# Patient Record
Sex: Female | Born: 1955 | Race: White | Hispanic: No | Marital: Married | Smoking: Never smoker
Health system: Southern US, Community
[De-identification: ages and names within clinical notes are randomized; demographics above are authoritative.]

## PROBLEM LIST (undated history)

## (undated) DIAGNOSIS — L509 Urticaria, unspecified: Secondary | ICD-10-CM

## (undated) DIAGNOSIS — L719 Rosacea, unspecified: Secondary | ICD-10-CM

## (undated) DIAGNOSIS — R51 Headache: Secondary | ICD-10-CM

## (undated) DIAGNOSIS — G4733 Obstructive sleep apnea (adult) (pediatric): Secondary | ICD-10-CM

## (undated) DIAGNOSIS — M1990 Unspecified osteoarthritis, unspecified site: Secondary | ICD-10-CM

## (undated) DIAGNOSIS — B029 Zoster without complications: Secondary | ICD-10-CM

## (undated) DIAGNOSIS — F32A Depression, unspecified: Secondary | ICD-10-CM

## (undated) DIAGNOSIS — G4730 Sleep apnea, unspecified: Secondary | ICD-10-CM

## (undated) DIAGNOSIS — T7840XA Allergy, unspecified, initial encounter: Secondary | ICD-10-CM

## (undated) DIAGNOSIS — D229 Melanocytic nevi, unspecified: Secondary | ICD-10-CM

## (undated) DIAGNOSIS — R768 Other specified abnormal immunological findings in serum: Secondary | ICD-10-CM

## (undated) DIAGNOSIS — Z780 Asymptomatic menopausal state: Secondary | ICD-10-CM

## (undated) DIAGNOSIS — R0602 Shortness of breath: Secondary | ICD-10-CM

## (undated) DIAGNOSIS — S62101A Fracture of unspecified carpal bone, right wrist, initial encounter for closed fracture: Secondary | ICD-10-CM

## (undated) DIAGNOSIS — F329 Major depressive disorder, single episode, unspecified: Secondary | ICD-10-CM

## (undated) DIAGNOSIS — R9439 Abnormal result of other cardiovascular function study: Secondary | ICD-10-CM

## (undated) DIAGNOSIS — E785 Hyperlipidemia, unspecified: Secondary | ICD-10-CM

## (undated) HISTORY — DX: Hyperlipidemia, unspecified: E78.5

## (undated) HISTORY — DX: Zoster without complications: B02.9

## (undated) HISTORY — DX: Other specified abnormal immunological findings in serum: R76.8

## (undated) HISTORY — PX: OTHER SURGICAL HISTORY: SHX169

## (undated) HISTORY — DX: Headache: R51

## (undated) HISTORY — DX: Asymptomatic menopausal state: Z78.0

## (undated) HISTORY — DX: Melanocytic nevi, unspecified: D22.9

## (undated) HISTORY — DX: Urticaria, unspecified: L50.9

## (undated) HISTORY — DX: Abnormal result of other cardiovascular function study: R94.39

## (undated) HISTORY — PX: WISDOM TOOTH EXTRACTION: SHX21

## (undated) HISTORY — DX: Rosacea, unspecified: L71.9

## (undated) HISTORY — DX: Fracture of unspecified carpal bone, right wrist, initial encounter for closed fracture: S62.101A

## (undated) HISTORY — DX: Allergy, unspecified, initial encounter: T78.40XA

## (undated) HISTORY — DX: Unspecified osteoarthritis, unspecified site: M19.90

## (undated) HISTORY — DX: Obstructive sleep apnea (adult) (pediatric): G47.33

## (undated) HISTORY — DX: Shortness of breath: R06.02

## (undated) HISTORY — PX: ORIF WRIST FRACTURE: SHX2133

## (undated) HISTORY — DX: Major depressive disorder, single episode, unspecified: F32.9

## (undated) HISTORY — DX: Depression, unspecified: F32.A

---

## 2009-01-01 DIAGNOSIS — L509 Urticaria, unspecified: Secondary | ICD-10-CM

## 2009-01-01 HISTORY — DX: Urticaria, unspecified: L50.9

## 2011-10-29 ENCOUNTER — Ambulatory Visit: Payer: Self-pay | Admitting: Internal Medicine

## 2011-11-13 ENCOUNTER — Ambulatory Visit (INDEPENDENT_AMBULATORY_CARE_PROVIDER_SITE_OTHER): Payer: 59 | Admitting: Internal Medicine

## 2011-11-13 ENCOUNTER — Encounter: Payer: Self-pay | Admitting: Internal Medicine

## 2011-11-13 VITALS — BP 110/78 | HR 113 | Temp 97.5°F | Resp 18 | Wt 145.0 lb

## 2011-11-13 DIAGNOSIS — Z23 Encounter for immunization: Secondary | ICD-10-CM

## 2011-11-13 DIAGNOSIS — Z139 Encounter for screening, unspecified: Secondary | ICD-10-CM

## 2011-11-13 DIAGNOSIS — L509 Urticaria, unspecified: Secondary | ICD-10-CM

## 2011-11-13 DIAGNOSIS — R51 Headache: Secondary | ICD-10-CM

## 2011-11-13 DIAGNOSIS — R0609 Other forms of dyspnea: Secondary | ICD-10-CM

## 2011-11-13 DIAGNOSIS — Z780 Asymptomatic menopausal state: Secondary | ICD-10-CM

## 2011-11-13 DIAGNOSIS — R0989 Other specified symptoms and signs involving the circulatory and respiratory systems: Secondary | ICD-10-CM

## 2011-11-13 DIAGNOSIS — E328 Other diseases of thymus: Secondary | ICD-10-CM

## 2011-11-13 DIAGNOSIS — D229 Melanocytic nevi, unspecified: Secondary | ICD-10-CM

## 2011-11-13 DIAGNOSIS — F3289 Other specified depressive episodes: Secondary | ICD-10-CM

## 2011-11-13 DIAGNOSIS — D239 Other benign neoplasm of skin, unspecified: Secondary | ICD-10-CM

## 2011-11-13 DIAGNOSIS — F329 Major depressive disorder, single episode, unspecified: Secondary | ICD-10-CM

## 2011-11-13 DIAGNOSIS — F32A Depression, unspecified: Secondary | ICD-10-CM

## 2011-11-13 DIAGNOSIS — R0683 Snoring: Secondary | ICD-10-CM

## 2011-11-13 DIAGNOSIS — L719 Rosacea, unspecified: Secondary | ICD-10-CM | POA: Insufficient documentation

## 2011-11-13 NOTE — Patient Instructions (Addendum)
Flu shot today  Labs on [employer]

## 2011-11-14 ENCOUNTER — Encounter: Payer: Self-pay | Admitting: Internal Medicine

## 2011-11-14 DIAGNOSIS — R519 Headache, unspecified: Secondary | ICD-10-CM | POA: Insufficient documentation

## 2011-11-14 DIAGNOSIS — R51 Headache: Secondary | ICD-10-CM | POA: Insufficient documentation

## 2011-11-14 DIAGNOSIS — D229 Melanocytic nevi, unspecified: Secondary | ICD-10-CM | POA: Insufficient documentation

## 2011-11-14 NOTE — Progress Notes (Signed)
  Subjective:    Patient ID: Brianna Ross, female    DOB: 07/11/1955, 56 y.o.   MRN: [id number]  HPI [name] is here for first visit to establish primary care.  PMH depression well controlled on Pristiq, chronic headaches which pt believes to be migraines, genital herpes (Type ), childhood asthma, atypcial nevii rosacea and urticaria of unknown etiology.    Pt is concerned about excessive snoring and daytime sleepiness.  She states her husband mentions that she may stop breathing at times at night.  She has never had a colonoscopy and would like a referral  No Known Allergies Past Medical History  Diagnosis Date  . Allergy    Past Surgical History  Procedure Date  . Thymic cyst     removal   History   Social History  . Marital Status: Married    Spouse Name: N/A    Number of Children: N/A  . Years of Education: N/A   Occupational History  . Not on file.   Social History Main Topics  . Smoking status: Never Smoker   . Smokeless tobacco: Not on file  . Alcohol Use: No  . Drug Use: No  . Sexually Active: Yes    Birth Control/ Protection: Post-menopausal   Other Topics Concern  . Not on file   Social History Narrative  . No narrative on file   Family History  Problem Relation Age of Onset  . Glaucoma Mother   . Hyperlipidemia Mother   . Depression Mother   . Osteoporosis Mother   . Heart disease Father   . Hyperlipidemia Father   . Hypertension Father   . Heart defect Sister   . Hyperlipidemia Brother   . Cancer Paternal Grandmother   . Stroke Paternal Grandfather   . Heart disease Paternal Grandfather    Patient Active Problem List  Diagnosis  . Depression  . Menopause  . Rosacea  . Urticaria  . Thymic cyst  . Headache   Current Outpatient Prescriptions on File Prior to Visit  Medication Sig Dispense Refill  . desvenlafaxine (PRISTIQ) 50 MG 24 hr tablet Take 50 mg by mouth daily.           Review of Systems See HPI    Objective:   Physical  Exam Physical Exam  Nursing note and vitals reviewed.  Constitutional: She is oriented to person, place, and time. She appears well-developed and well-nourished.  HENT:  Head: Normocephalic and atraumatic.  Cardiovascular: Normal rate and regular rhythm. Exam reveals no gallop and no friction rub.  No murmur heard.  Pulmonary/Chest: Breath sounds normal. She has no wheezes. She has no rales.  Neurological: She is alert and oriented to person, place, and time.  Skin: Skin is warm and dry.  Psychiatric: She has a normal mood and affect. Her behavior is normal.  Ext no edema       Assessment & Plan:  Snoring, daytime sleepiness:   Will refer to Dr. Vickey Huger for sleep eval  Depression  Managed by Dr. Nolen Mu  Well controlled on Pristiq  Menopause  Minimal symptomatology  History of Genital herpes type I  HIstory of atypical nevi  Managed by Dr. Emily Filbert  Rosacea  History of childhood asthma  Influenza vaccine today  Refer to GI for colonoscopy

## 2012-02-17 ENCOUNTER — Encounter: Payer: Self-pay | Admitting: Internal Medicine

## 2012-02-17 DIAGNOSIS — G4733 Obstructive sleep apnea (adult) (pediatric): Secondary | ICD-10-CM | POA: Insufficient documentation

## 2012-02-21 ENCOUNTER — Encounter: Payer: Self-pay | Admitting: Internal Medicine

## 2012-02-21 ENCOUNTER — Other Ambulatory Visit: Payer: Self-pay | Admitting: Internal Medicine

## 2012-02-21 ENCOUNTER — Ambulatory Visit (INDEPENDENT_AMBULATORY_CARE_PROVIDER_SITE_OTHER): Payer: 59 | Admitting: Internal Medicine

## 2012-02-21 VITALS — BP 122/73

## 2012-02-21 DIAGNOSIS — IMO0001 Reserved for inherently not codable concepts without codable children: Secondary | ICD-10-CM | POA: Insufficient documentation

## 2012-02-21 DIAGNOSIS — M791 Myalgia, unspecified site: Secondary | ICD-10-CM

## 2012-02-21 MED ORDER — NABUMETONE 500 MG PO TABS: 500.0000 mg | ORAL_TABLET | Freq: Two times a day (BID) | ORAL | Status: DC

## 2012-02-21 NOTE — Progress Notes (Signed)
Subjective:    Patient ID: Brianna Ross, female    DOB: 07-21-1955, 57 y.o.   MRN: [id number]  HPI Brianna Ross is here for acute visit.    She has L are muscle pain for the past 6-9 months.  She points to muscle belly of her L arm.  L shoulder and L elbow joint is fine no pain.  She has take a rare Tylenol for this  No rash no redness to arm.  She believes things may have started when she lifted some heavy charts at Dr. Otilio Ross office  No new meds  No Known Allergies Past Medical History  Diagnosis Date  . Allergy    Past Surgical History  Procedure Laterality Date  . Thymic cyst      removal   History   Social History  . Marital Status: Married    Spouse Name: N/A    Number of Children: N/A  . Years of Education: N/A   Occupational History  . Not on file.   Social History Main Topics  . Smoking status: Never Smoker   . Smokeless tobacco: Not on file  . Alcohol Use: No  . Drug Use: No  . Sexually Active: Yes    Birth Control/ Protection: Post-menopausal   Other Topics Concern  . Not on file   Social History Narrative  . No narrative on file   Family History  Problem Relation Age of Onset  . Glaucoma Mother   . Hyperlipidemia Mother   . Depression Mother   . Osteoporosis Mother   . Heart disease Father   . Hyperlipidemia Father   . Hypertension Father   . Heart defect Sister   . Hyperlipidemia Brother   . Cancer Paternal Grandmother   . Stroke Paternal Grandfather   . Heart disease Paternal Grandfather    Patient Active Problem List  Diagnosis  . Depression  . Menopause  . Rosacea  . Urticaria  . Thymic cyst  . Headache  . Atypical nevi  . Obstructive sleep apnea   Current Outpatient Prescriptions on File Prior to Visit  Medication Sig Dispense Refill  . acetaminophen (TYLENOL) 500 MG tablet Take 500 mg by mouth every 6 (six) hours as needed.      . Alcaftadine (LASTACAFT) 0.25 % SOLN Apply to eye.      . betamethasone valerate (VALISONE) 0.1 % cream  Apply topically as needed.      . desvenlafaxine (PRISTIQ) 50 MG 24 hr tablet Take 50 mg by mouth daily.      . metroNIDAZOLE (METROGEL) 1 % gel Apply topically daily.       No current facility-administered medications on file prior to visit.       Review of Systems    see HPI Objective:   Physical Exam Physical Exam  Nursing note and vitals reviewed.  Constitutional: She is oriented to person, place, and time. She appears well-developed and well-nourished.  HENT:  Head: Normocephalic and atraumatic.  Cardiovascular: Normal rate and regular rhythm. Exam reveals no gallop and no friction rub.  No murmur heard.  Pulmonary/Chest: Breath sounds normal. She has no wheezes. She has no rales.  Neurological: She is alert and oriented to person, place, and time. M/S  No active synovitis to L shoudler or L elbow.  She has subjective pain to palpation of biceps muscle  Skin: Skin is warm and dry.  Psychiatric: She has a normal mood and affect. Her behavior is normal.  Assessment & Plan:  Myalgia vs biceps tendinitis  Will give Relafen 500 mg bid for the next 3 weeks.  No lifting with L arm  Will check ANA , CPK and aldolase.    See me if not better

## 2012-02-21 NOTE — Patient Instructions (Addendum)
See me in 3-4 weeks

## 2012-02-22 DIAGNOSIS — M791 Myalgia, unspecified site: Secondary | ICD-10-CM | POA: Insufficient documentation

## 2012-02-28 ENCOUNTER — Telehealth: Payer: Self-pay | Admitting: *Deleted

## 2012-02-28 LAB — CK: Total CK: 54 U/L (ref 7–177)

## 2012-02-28 NOTE — Telephone Encounter (Signed)
[facility] contacted regarding missing lab results states that blood was not collected but will add Aldolase and Ck to existing blood.

## 2012-03-01 DIAGNOSIS — B029 Zoster without complications: Secondary | ICD-10-CM

## 2012-03-01 HISTORY — DX: Zoster without complications: B02.9

## 2012-03-04 ENCOUNTER — Telehealth: Payer: Self-pay | Admitting: Internal Medicine

## 2012-03-04 NOTE — Telephone Encounter (Signed)
Left message to call office on WEdnesday for results

## 2012-03-04 NOTE — Telephone Encounter (Signed)
Left message on home phone to call and discuss labs

## 2012-03-04 NOTE — Telephone Encounter (Signed)
Left message on home phone for pt to call regarding lab results

## 2012-03-05 ENCOUNTER — Telehealth: Payer: Self-pay | Admitting: *Deleted

## 2012-03-05 ENCOUNTER — Encounter: Payer: Self-pay | Admitting: *Deleted

## 2012-03-05 NOTE — Telephone Encounter (Signed)
Left message on home phone [phone]  To retrun call to office to discuss labs

## 2012-03-05 NOTE — Telephone Encounter (Signed)
Pt returned Dr Lonell Face phone call regarding lab results can be reached at [phone] for the next hour if not will  Call back tomorrow

## 2012-03-06 ENCOUNTER — Encounter: Payer: Self-pay | Admitting: *Deleted

## 2012-03-06 ENCOUNTER — Telehealth: Payer: Self-pay | Admitting: Internal Medicine

## 2012-03-06 DIAGNOSIS — R768 Other specified abnormal immunological findings in serum: Secondary | ICD-10-CM

## 2012-03-06 DIAGNOSIS — R748 Abnormal levels of other serum enzymes: Secondary | ICD-10-CM | POA: Insufficient documentation

## 2012-03-06 NOTE — Telephone Encounter (Signed)
Spoke with pt and informed of lab results.    With elevated aldolase and pos ANA  Will refer get rheumatology opinion

## 2012-03-23 ENCOUNTER — Encounter (HOSPITAL_BASED_OUTPATIENT_CLINIC_OR_DEPARTMENT_OTHER): Payer: Self-pay | Admitting: *Deleted

## 2012-03-23 ENCOUNTER — Emergency Department (HOSPITAL_BASED_OUTPATIENT_CLINIC_OR_DEPARTMENT_OTHER)
Admission: EM | Admit: 2012-03-23 | Discharge: 2012-03-24 | Disposition: A | Discharge status: Home / Self Care | Payer: 59 | Attending: Emergency Medicine | Admitting: Emergency Medicine

## 2012-03-23 DIAGNOSIS — Z8669 Personal history of other diseases of the nervous system and sense organs: Secondary | ICD-10-CM | POA: Insufficient documentation

## 2012-03-23 DIAGNOSIS — Z79899 Other long term (current) drug therapy: Secondary | ICD-10-CM | POA: Insufficient documentation

## 2012-03-23 DIAGNOSIS — B0222 Postherpetic trigeminal neuralgia: Secondary | ICD-10-CM

## 2012-03-23 HISTORY — DX: Sleep apnea, unspecified: G47.30

## 2012-03-23 NOTE — ED Provider Notes (Signed)
History     CSN: [id number]  Arrival date & time 03/23/12  2238   First MD Initiated Contact with Patient 03/23/12 2349      Chief Complaint  Patient presents with  . Rash    (Consider location/radiation/quality/duration/timing/severity/associated sxs/prior treatment) HPI This a 57 year old female who had the gradual onset of a painful rash of her left forehead. It began insidiously and has worsened. It is moderately painful. It is primarily located in the glabella and less so in the upper and lateral aspects of the left forehead. There is no involvement of the. She denies any systemic symptoms, notably no fever, chills, cough, shortness of breath, chest pain, abdominal pain, nausea, vomiting or diarrhea.  Past Medical History  Diagnosis Date  . Allergy   . Sleep apnea     Past Surgical History  Procedure Laterality Date  . Thymic cyst      removal    Family History  Problem Relation Age of Onset  . Glaucoma Mother   . Hyperlipidemia Mother   . Depression Mother   . Osteoporosis Mother   . Heart disease Father   . Hyperlipidemia Father   . Hypertension Father   . Heart defect Sister   . Hyperlipidemia Brother   . Cancer Paternal Grandmother   . Stroke Paternal Grandfather   . Heart disease Paternal Grandfather     History  Substance Use Topics  . Smoking status: Never Smoker   . Smokeless tobacco: Not on file  . Alcohol Use: No    OB History   Grav Para Term Preterm Abortions TAB SAB Ect Mult Living                  Review of Systems  All other systems reviewed and are negative.    Allergies  Review of patient's allergies indicates no known allergies.  Home Medications   Current Outpatient Rx  Name  Route  Sig  Dispense  Refill  . diltiazem 2 % GEL   Topical   Apply topically 2 (two) times daily.         Marland Kitchen acetaminophen (TYLENOL) 500 MG tablet   Oral   Take 500 mg by mouth every 6 (six) hours as needed.         . Alcaftadine  (LASTACAFT) 0.25 % SOLN   Ophthalmic   Apply to eye.         . betamethasone valerate (VALISONE) 0.1 % cream   Topical   Apply topically as needed.         . desvenlafaxine (PRISTIQ) 50 MG 24 hr tablet   Oral   Take 50 mg by mouth daily.         . metroNIDAZOLE (METROGEL) 1 % gel   Topical   Apply topically daily.         . nabumetone (RELAFEN) 500 MG tablet   Oral   Take 1 tablet (500 mg total) by mouth 2 (two) times daily.   30 tablet   0     BP 129/87  Pulse 90  Temp(Src) 98.2 F (36.8 C) (Oral)  Resp 20  Ht 5' 4.5" (1.638 m)  Wt 145 lb (65.772 kg)  BMI 24.51 kg/m2  SpO2 99%  Physical Exam General: Well-developed, well-nourished female in no acute distress; appearance consistent with age of record HENT: normocephalic, atraumatic Eyes: pupils equal round and reactive to light; extraocular muscles intact; no erythema or exudate Neck: supple Heart: regular rate and rhythm Lungs: Normal  respiratory effort and excursion Abdomen: soft; nondistended Extremities: No deformity; full range of motion Neurologic: Awake, alert and oriented; motor function intact in all extremities and symmetric; no facial droop Skin: Warm and dry; erythematous papular rash of left forehead, primarily in the glabella, early vesicular formation noted Psychiatric: Normal mood and affect    ED Course  Procedures (including critical care time)     MDM  Will treat presumptively for early herpes zoster of the left first branch of the trigeminal nerve. She was advised to see her ophthalmologist should any ocular involvement occur.        Carlisle Beers Ebunoluwa Gernert, MD 03/24/12 0000

## 2012-03-23 NOTE — ED Notes (Signed)
Pt states she had 3 small red bumps to her forehead this a.m. that have come together to now form a raised red area to her forehead.

## 2012-03-24 MED ORDER — HYDROCODONE-ACETAMINOPHEN 5-325 MG PO TABS: 1.0000 | ORAL_TABLET | Freq: Four times a day (QID) | ORAL | Status: DC | PRN

## 2012-03-24 MED ORDER — VALACYCLOVIR HCL 1 G PO TABS: 1000.0000 mg | ORAL_TABLET | Freq: Three times a day (TID) | ORAL | Status: AC

## 2012-03-24 NOTE — Discharge Instructions (Signed)
Shingles Shingles is caused by the same virus that causes chickenpox (varicella zoster virus or VZV). Shingles often occurs many years or decades after having chickenpox. That is why it is more common in adults older than 50 years. The virus reactivates and breaks out as an infection in a nerve root. SYMPTOMS   The initial feeling (sensations) may be pain. This pain is usually described as:  Burning.  Stabbing.  Throbbing.  Tingling in the nerve root.  A red rash will follow in a couple days. The rash may occur in any area of the body and is usually on one side (unilateral) of the body in a band or belt-like pattern. The rash usually starts out as very small blisters (vesicles). They will dry up after 7 to 10 days. This is not usually a significant problem except for the pain it causes.  Long-lasting (chronic) pain is more likely in an elderly person. It can last months to years. This condition is called postherpetic neuralgia. Shingles can be an extremely severe infection in someone with AIDS, a weakened immune system, or with forms of leukemia. It can also be severe if you are taking transplant medicines or other medicines that weaken the immune system. TREATMENT  Your caregiver will often treat you with:  Antiviral drugs.  Anti-inflammatory drugs.  Pain medicines. Bed rest is very important in preventing the pain associated with herpes zoster (postherpetic neuralgia). Application of heat in the form of a hot water bottle or electric heating pad or gentle pressure with the hand is recommended to help with the pain or discomfort. PREVENTION  A varicella zoster vaccine is available to help protect against the virus. The Food and Drug Administration approved the varicella zoster vaccine for individuals 61 years of age and older. HOME CARE INSTRUCTIONS   Cool compresses to the area of rash may be helpful.  Only take over-the-counter or prescription medicines for pain, discomfort, or  fever as directed by your caregiver.  Avoid contact with:  Babies.  Pregnant women.  Children with eczema.  Elderly people with transplants.  People with chronic illnesses, such as leukemia and AIDS.  If the area involved is on your face, you may receive a referral for follow-up to a specialist. It is very important to keep all follow-up appointments. This will help avoid eye complications, chronic pain, or disability. SEEK IMMEDIATE MEDICAL CARE IF:   You develop any pain (headache) in the area of the eye. This must be followed carefully by your caregiver or ophthalmologist. An infection in part of your eye (cornea) can be very serious. It could lead to blindness.  You do not have pain relief from prescribed medicines.  Your redness or swelling spreads.  The area involved becomes very swollen and painful.  You have a fever.  You notice any red or painful lines extending away from the affected area toward your heart (lymphangitis).  Your condition is worsening or has changed. Document Released: 12/18/2004 Document Revised: 03/12/2011 Document Reviewed: 11/22/2008 [facility] Patient Information 2013 [place], [place].

## 2012-04-03 ENCOUNTER — Encounter: Payer: Self-pay | Admitting: Neurology

## 2012-04-08 NOTE — Progress Notes (Signed)
Patient has to be sched for a consult?

## 2012-04-21 ENCOUNTER — Encounter: Payer: Self-pay | Admitting: Internal Medicine

## 2012-04-21 DIAGNOSIS — B0239 Other herpes zoster eye disease: Secondary | ICD-10-CM | POA: Insufficient documentation

## 2012-05-01 ENCOUNTER — Encounter: Payer: Self-pay | Admitting: Neurology

## 2012-05-16 ENCOUNTER — Ambulatory Visit (INDEPENDENT_AMBULATORY_CARE_PROVIDER_SITE_OTHER): Payer: 59 | Admitting: Neurology

## 2012-05-16 ENCOUNTER — Encounter: Payer: Self-pay | Admitting: Neurology

## 2012-05-16 VITALS — BP 108/76 | HR 89 | Temp 98.0°F | Ht 64.75 in | Wt 144.0 lb

## 2012-05-16 DIAGNOSIS — F32A Depression, unspecified: Secondary | ICD-10-CM

## 2012-05-16 DIAGNOSIS — G4733 Obstructive sleep apnea (adult) (pediatric): Secondary | ICD-10-CM

## 2012-05-16 DIAGNOSIS — F3289 Other specified depressive episodes: Secondary | ICD-10-CM

## 2012-05-16 DIAGNOSIS — F329 Major depressive disorder, single episode, unspecified: Secondary | ICD-10-CM

## 2012-05-16 MED ORDER — ESZOPICLONE 2 MG PO TABS: 2.0000 mg | ORAL_TABLET | Freq: Every day | ORAL | Status: DC

## 2012-05-16 NOTE — Progress Notes (Signed)
[facility]  Provider:  Dr Vickey Huger Referring Provider: Kendrick Ranch, * Primary Care Physician:  Levon Hedger, MD  Chief Complaint  Patient presents with  . New Evaluation    post CPAP, RM 10    HPI:  Crystalyn Delia is a 57 y.o. female here as a referral from Dr. Constance Goltz for EDS. Epworth is endorsed at 15 points, the sleepiness is present  for 30 years.  The patient was referred for a possible split study by her primary care physician. Due to problems initiating and maintaining sleep a normal PSG was performed and the patient returned for a titration study 14 days later.  [facility] was 57.9  and her  CPAP titration attempt failed.  Titration study from 02/16/2012 still documented trouble with insomnia, but also allow her to for a recommended pressure setting.  CPAP still was hard to tolerate, The patient was provided a BiPAP by  [facility] and she just turned current data to them 2 days ago, to allow [place] a review of her use a time and date.  We should have 30 and 90 days downloads here.    BIPAP of 13 over 8  Cm Water was recommended, she could sleep 67 minutes and had over 10 minutes REM sleep.      Review of Systems: Out of a complete 14 system review, the patient complains of only the following symptoms, and all other reviewed systems are negative. Poor sleep quality for about 6 years,  and EDS, Epworth 15. Shingle pain durng the first night in the lab.  Chronic depression, better on pristiq.   History   Social History  . Marital Status: Married    Spouse Name: N/A    Number of Children: 0  . Years of Education: 16   Occupational History  .     Social History Main Topics  . Smoking status: Never Smoker   . Smokeless tobacco: Not on file  . Alcohol Use: Yes     Comment: rarely  . Drug Use: No  . Sexually Active: Yes    Birth Control/ Protection: Post-menopausal   Other Topics Concern  . Not on file   Social History Narrative  .  No narrative on file    Family History  Problem Relation Age of Onset  . Glaucoma Mother   . Hyperlipidemia Mother   . Depression Mother   . Osteoporosis Mother   . Heart disease Father   . Hyperlipidemia Father   . Hypertension Father   . Heart defect Sister   . Heart disease Sister 13    congenital heart defect,deceased  . Hyperlipidemia Brother   . Depression Brother   . High Cholesterol Brother   . Cancer Paternal Grandmother   . Stroke Paternal Grandfather   . Heart disease Paternal Grandfather     Past Medical History  Diagnosis Date  . Allergy   . Sleep apnea   . Rosacea   . Depression   . Shingles 03/2012  . ANA positive   . Hives 2011    x5    Past Surgical History  Procedure Laterality Date  . Thymic cyst      removal  . Wisdom tooth extraction      Current Outpatient Prescriptions  Medication Sig Dispense Refill  . acetaminophen (TYLENOL) 325 MG tablet Take 650 mg by mouth every 6 (six) hours as needed for pain.      . Alcaftadine (LASTACAFT) 0.25 % SOLN Apply to  eye. Daily for eye allergy      . betamethasone valerate (VALISONE) 0.1 % cream Apply topically as needed.      . desvenlafaxine (PRISTIQ) 50 MG 24 hr tablet Take 50 mg by mouth daily.      Marland Kitchen diltiazem 2 % GEL Apply topically 2 (two) times daily. As needed      . metroNIDAZOLE (METROGEL) 1 % gel Apply topically daily.      . prednisoLONE acetate (PRED FORTE) 1 % ophthalmic suspension 1 drop 4 (four) times daily.      . Vitamin D, Ergocalciferol, (DRISDOL) 50000 UNITS CAPS Take 50,000 Units by mouth once a week.       No current facility-administered medications for this visit.    Allergies as of 05/16/2012  . (No Known Allergies)    Vitals: BP 108/76  Pulse 89  Temp(Src) 98 F (36.7 C) (Oral)  Ht 5' 4.75" (1.645 m)  Wt 144 lb (65.318 kg)  BMI 24.14 kg/m2 Last Weight:  Wt Readings from Last 1 Encounters:  05/16/12 144 lb (65.318 kg)   Last Height:   Ht Readings from Last 1  Encounters:  05/16/12 5' 4.75" (1.645 m)    Physical exam:  General: The patient is awake, alert and appears not in acute distress. The patient is well groomed. Head: Normocephalic, atraumatic. Neck is supple. Mallampati 2 , neck circumference:14. Cardiovascular:  Regular rate and rhythm, without  murmurs or carotid bruit, and without distended neck veins.  Used to wear dental braces, had a crowded dental status.  Respiratory: Lungs are clear to auscultation. Skin:  Without evidence of edema, or rash Trunk: BMI is elevated and patient  has normal posture.  Neurologic exam : The patient is awake and alert, oriented to place and time.  Memory subjectivedescribed as intact. There is a normal attention span & concentration ability. Speech is fluent without  dysarthria, dysphonia or aphasia. Mood and affect are appropriate.  Cranial nerves: Pupils are equal and briskly reactive to light. Funduscopic exam without  evidence of pallor or edema. Extraocular movements  in vertical and horizontal planes intact and without nystagmus. Visual fields by finger perimetry are intact. Hearing to finger rub intact.  Facial sensation intact to fine touch. Facial motor strength is symmetric and tongue and uvula move midline.  Motor exam:   Normal tone and normal muscle bulk and symmetric normal strength in all extremities.  Sensory:  Fine touch, pinprick and vibration were tested in all extremities. Proprioception is tested in the upper extremities only. This was normal.   normal.  Coordination: Rapid alternating movements in the fingers/hands is tested and normal. Finger-to-nose maneuver tested and normal without evidence of ataxia, dysmetria or tremor.  Gait and station: Patient walks without assistive device and is able and assisted stool climb up to the exam table. Strength within normal limits.  Deep tendon reflexes: in the  upper and lower extremities are symmetric and intact. Babinski maneuver  response is  downgoing.   Assessment:  After physical and neurologic examination, review of laboratory studies, imaging, neurophysiology testing and pre-existing records, assessment will be reviewed on the problem list.  Plan:  Treatment plan and additional workup will be reviewed under Problem List.  Patient was dark diagonals was a surprisingly high AHI in her baseline study of over 50 was first titrated to CPAP which he could not tolerate and then finally to BiPAP. She is using the BiPAP now at 13/8 cm water and is working on improving  gradually compliance. She is claustral 4 hours at night. She has noted that morning headaches no longer occurs and she is on positive airway pressure. The patient does have problems with insomnia as well as daytime hypersomnia some of these for many many years. She normally has no trouble falling asleep in her home environment but did in the lab.  He does not is some discomfort is using the BiPAP, mainly due to the interface not as a pressure. She could not tolerate any nasal application of air as well as a fullface mask,  she states.

## 2012-05-16 NOTE — Patient Instructions (Signed)
CPAP and BIPAP  CPAP and BIPAP are methods of helping you breathe. CPAP stands for "continuous positive airway pressure." BIPAP stands for "bi-level positive airway pressure." Both CPAP and BIPAP are provided by a small machine with a flexible plastic tube that attaches to a plastic mask that goes over your nose or mouth. Air is blown into your air passages through your nose or mouth. This helps to keep your airways open and helps to keep you breathing well.  The amount of pressure that is used to blow the air into your air passages can be set on the machine. The pressure setting is based on your needs. With CPAP, the amount of pressure stays the same while you breathe in and out. With BIPAP, the amount of pressure changes when you inhale and exhale. Your caregiver will recommend whether CPAP or BIPAP would be more helpful for you.    CPAP and BIPAP can be helpful for both adults and children with:   Sleep apnea.   Chronic Obstructive Pulmonary Disease (COPD), a condition like emphysema.   Diseases which weaken the muscles of the chest such as muscular dystrophy or neurological diseases.   Other problems that cause breathing to be weak or difficult.  USE OF CPAP OR BIPAP  The respiratory therapist or technician will help you get used to wearing the mask. Some people feel claustrophobic (a trapped or closed in feeling) at first, because the mask needs to be fairly snug on your face.     It may help you to get used to the mask gradually, by first holding the mask loosely over your nose or mouth using a low pressure setting on the machine. Gradually the mask can be applied more snugly with increased pressure. You can also gradually increase the amount of time the mask is used.   People with sleep apnea will use the mask and machine at night when they are sleeping. Others, like those with ALS or other breathing difficulties, may need the CPAP or BIPAP all the time.    If the first mask you try does not fit well, or is uncomfortable, there are other types and sizes that can be tried.   If you tend to breathe through your mouth, a chin strap may be applied to help keep your mouth closed (if you are using a nasal mask).   The CPAP and BIPAP machines have alarms that may sound if the mask comes off or develops a leak.   You should not eat or drink while the CPAP or BIPAP is on. Food or fluids could get pushed into your lungs by the pressure of the CPAP or BIPAP.  Sometimes CPAP or BIPAP machines are ordered for home use. If you are going to use the CPAP or BIPAP machine at home, follow these instructions   CPAP or BIPAP machines can be rented or purchased through home health care companies. There are many different brands of machines available. If you rent a machine before purchasing you may find which particular machine works well for you.   Ask questions if there is something you do not understand when picking out your machine.   Place your CPAP or BIPAP machine on a secure table or stand near an electrical outlet.   Know where the On/Off switch is.   Follow your doctor's instructions for how to set the pressure on your machine and when you should use it.   Do not smoke! Tobacco smoke residue can damage the   machine.  SEEK IMMEDIATE MEDICAL CARE IF:     You have redness or open areas around your nose or mouth.   You have trouble operating the CPAP or BIPAP machine.   You cannot tolerate wearing the CPAP or BIPAP mask.   You have any questions or concerns.  Document Released: 09/16/2003 Document Revised: 03/12/2011 Document Reviewed: 12/16/2007  [employer] Patient Information 2013 [employer].

## 2012-05-16 NOTE — Assessment & Plan Note (Signed)
OSA treatment partially succesfull,  Morning headaches have resolved.  No  nocturia.  45 day user time reviewed. -  Residual AHi still 12 , from 50 AHI

## 2012-06-20 ENCOUNTER — Ambulatory Visit (INDEPENDENT_AMBULATORY_CARE_PROVIDER_SITE_OTHER): Payer: 59 | Admitting: Neurology

## 2012-06-20 ENCOUNTER — Encounter: Payer: Self-pay | Admitting: Neurology

## 2012-06-20 VITALS — BP 110/77 | HR 80 | Resp 14 | Ht 64.0 in | Wt 146.0 lb

## 2012-06-20 DIAGNOSIS — G4733 Obstructive sleep apnea (adult) (pediatric): Secondary | ICD-10-CM

## 2012-06-20 NOTE — Progress Notes (Signed)
post CPAP, RM 10     HPI: Brianna Ross is a 57 y.o. female here as a referral from Dr. Constance Goltz for EDS. Epworth is endorsed at 15 points, the sleepiness is present for 30 years.  The patient has a longstanding history of depression.  The patient was referred for a possible split study by her primary care physician. Due to problems initiating and maintaining sleep in the study situation , a normal PSG was performed.  The patient returned for a titration study 14 days later.  The [facility] was 57.9 and her CPAP titration attempt failed.   Titration study from 02/16/2012 still documented trouble with insomnia, but also allow her to for a recommended pressure setting. CPAP still was hard to tolerate, The patient was provided a [facility]PAP by [facility] and she just turned current data to them 2 days ago, to allow [place] a review of her use a time and date. 05-14-12 .   BIPAP of 13 over 8 cm Water was recommended, she could sleep 67 minutes and had over 10 minutes REM sleep.  At the beginning, now after an additional 5 weeks the patient reports that she can sleep between 6-7 hours on weekdays on the weekend even 8-9 hours. I gave her a  Lunesta prescription but it required preauthorization and she therefore was not able to fill it, will try today. 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 She feels overall that she has done better on the current BiPAP setting and has slowly begun to adjust to it. It 2 days Epworth Sleepiness Scale is 8 points and fatigue severity scale is 16 point . I reviewed the download for the patient's machine with for the last 30 days ( 06-19-12)  since her last visit with me her residual  AHI is 12.4 and user time was  6 hours 48 minutes,  the machine is set at 13 cm inspiratory pressure and 8 cm expiratory pressure,  the air leak is at times very high which affects her tidal volume, too.  She needs a new mask liner.   Respiratory rate is on average 13 per minute the AHI is broken down into 5.8 obstructive apneas, 3.7 central apneas and 2.2 unknown events. The patient recall that recently she was able to follow a rather boring conversation and stay awake for longer time than usual. Total user time is by now over 90 days.     Review of Systems:    Out of a complete 14 system review, the patient complains of only the following symptoms, and all other reviewed systems are negative.  Poor sleep quality for about 6 years, and EDS, Epworth 15. Shingle pain durng the first night in the lab.  Chronic depression, better on pristiq.    General: The patient is awake, alert and appears not in acute distress. The patient is well groomed. Head: Normocephalic, atraumatic. Neck is supple. Mallampati 2, neck circumference: 14 inches , no nasal septal deviation.  Cardiovascular:  Regular rate and rhythm, without  murmurs or carotid bruit, and without distended neck veins. Respiratory: Lungs are clear to auscultation. Skin:  Without evidence of edema, or rash Trunk: BMI is elevated and patient  has normal posture.   Neurologic exam : The patient is awake and alert, oriented to place and time.  Memory subjective  described as intact. There is a normal attention span & concentration ability. Speech is fluent without  dysarthria,  dysphonia or aphasia. Mood and affect are appropriate.  Cranial nerves: Pupils are equal and briskly reactive to light. Funduscopic exam without  evidence of pallor or edema. Extraocular movements  in vertical and horizontal planes intact and without nystagmus. Visual fields by finger perimetry are intact. Hearing to finger rub intact.  Facial sensation intact to fine touch. Facial motor strength is symmetric and tongue and uvula move midline.  Motor exam: Normal tone and normal muscle bulk and symmetric normal strength in all extremities.  Sensory:  Fine touch, pinprick and vibration were  tested in all extremities. Proprioception is tested in the upper extremities only. This was  normal.  Coordination: Rapid alternating movements in the fingers/hands is tested and normal. Finger-to-nose maneuver tested and normal without evidence of ataxia, dysmetria or tremor.  Gait and station: Patient walks without assistive device  Romberg testing is normal.  Deep tendon reflexes: in the  upper and lower extremities are symmetric and intact. Babinski maneuver response is downgoing.   Assessment:  After physical and neurologic examination, review of laboratory studies, imaging, neurophysiology testing and pre-existing records,   This patient had a history of morning headaches snoring fatigue depression over motor history of asthma in childhood and was referred for a sleep study due to fatigue and excessive daytime sleepiness. She had an RDI of 63.3 and a very high AHI of 57.9. This finding was unexpected since the patient is neither morbidly obese nor does she have any nasopharyngeal dysplasia or or trauma. Her neck circumference for example is 14 inches. A titration to CPAP was not well tolerated and BiPAP was much better tolerated the AHI is now reduced and her apnea index is further reduced in comparison to last Donald. At that Donald's still showed an AHI of 12.2 11.7 of these events were purely apnea 2 days apnea index is 5.84 obstructive, 3.74 central date user time of 6 hours and 48 minutes at the patient has been no using CPAP for 90 days and has reached compliance. BiPAP is medial pressure of 8 and inspiratory pressure of 13 cm water and will not need adjustment. Morning headaches have resolved. Sleepiness is improved, attention is improved. One night without PAP, she awoke with HA.   Plan:  Treatment plan and additional workup will be reviewed under Problem List.  Continue BiPAP.

## 2012-06-20 NOTE — Assessment & Plan Note (Signed)
Patient has become 100% compliant , no longer morning HA , baseline AHi was 52, now 11.

## 2012-07-28 ENCOUNTER — Encounter: Payer: Self-pay | Admitting: Neurology

## 2012-08-01 NOTE — Progress Notes (Signed)
Quick Note:  13 over 8 cm water setting, still 8.8 apneas per hour, not optimal , but not a bad result -depending on baseline AHI. Good compliance.  I like patient to continue using CPAP. Please work on mask fit. Lazarius Rivkin, MD  ______

## 2012-10-17 ENCOUNTER — Encounter: Payer: Self-pay | Admitting: *Deleted

## 2012-11-03 ENCOUNTER — Telehealth: Payer: Self-pay | Admitting: *Deleted

## 2012-11-03 DIAGNOSIS — Z780 Asymptomatic menopausal state: Secondary | ICD-10-CM

## 2012-11-03 DIAGNOSIS — Z0000 Encounter for general adult medical examination without abnormal findings: Secondary | ICD-10-CM

## 2012-11-03 DIAGNOSIS — B0239 Other herpes zoster eye disease: Secondary | ICD-10-CM

## 2012-11-03 NOTE — Telephone Encounter (Signed)
Brianna Ross would like to come in on Nov 5 for her lab work for her CPE on Nov 12.

## 2012-11-03 NOTE — Telephone Encounter (Signed)
Labs ordered and printed out placed at desk

## 2012-11-05 LAB — TSH: TSH: 1.092 u[IU]/mL (ref 0.350–4.500)

## 2012-11-05 LAB — LIPID PANEL
Total CHOL/HDL Ratio: 3.3 Ratio
VLDL: 16 mg/dL (ref 0–40)

## 2012-11-05 LAB — CBC WITH DIFFERENTIAL/PLATELET
Basophils Absolute: 0 10*3/uL (ref 0.0–0.1)
Basophils Relative: 0 % (ref 0–1)
Eosinophils Absolute: 0.2 10*3/uL (ref 0.0–0.7)
Eosinophils Relative: 5 % (ref 0–5)
HCT: 40.6 % (ref 36.0–46.0)
MCH: 29.6 pg (ref 26.0–34.0)
MCHC: 34 g/dL (ref 30.0–36.0)
Monocytes Absolute: 0.6 10*3/uL (ref 0.1–1.0)
Neutro Abs: 2.6 10*3/uL (ref 1.7–7.7)
Platelets: 249 10*3/uL (ref 150–400)
RDW: 13 % (ref 11.5–15.5)
WBC: 4.5 10*3/uL (ref 4.0–10.5)

## 2012-11-05 LAB — COMPREHENSIVE METABOLIC PANEL
Albumin: 4.2 g/dL (ref 3.5–5.2)
BUN: 12 mg/dL (ref 6–23)
CO2: 29 mEq/L (ref 19–32)
Calcium: 9.7 mg/dL (ref 8.4–10.5)
Chloride: 102 mEq/L (ref 96–112)
Glucose, Bld: 74 mg/dL (ref 70–99)
Potassium: 4.2 mEq/L (ref 3.5–5.3)

## 2012-11-06 LAB — VITAMIN D 25 HYDROXY (VIT D DEFICIENCY, FRACTURES): Vit D, 25-Hydroxy: 41 ng/mL (ref 30–89)

## 2012-11-12 ENCOUNTER — Encounter: Payer: Self-pay | Admitting: Internal Medicine

## 2012-11-12 ENCOUNTER — Ambulatory Visit (HOSPITAL_BASED_OUTPATIENT_CLINIC_OR_DEPARTMENT_OTHER)
Admission: RE | Admit: 2012-11-12 | Discharge: 2012-11-12 | Disposition: A | Discharge status: Home / Self Care | Payer: 59 | Source: Ambulatory Visit | Attending: Internal Medicine | Admitting: Internal Medicine

## 2012-11-12 ENCOUNTER — Ambulatory Visit (INDEPENDENT_AMBULATORY_CARE_PROVIDER_SITE_OTHER): Payer: 59 | Admitting: Internal Medicine

## 2012-11-12 VITALS — BP 109/71 | HR 74 | Temp 98.4°F | Resp 16 | Wt 146.0 lb

## 2012-11-12 DIAGNOSIS — R0609 Other forms of dyspnea: Secondary | ICD-10-CM

## 2012-11-12 DIAGNOSIS — Z23 Encounter for immunization: Secondary | ICD-10-CM

## 2012-11-12 DIAGNOSIS — R0602 Shortness of breath: Secondary | ICD-10-CM | POA: Insufficient documentation

## 2012-11-12 DIAGNOSIS — B0233 Zoster keratitis: Secondary | ICD-10-CM

## 2012-11-12 DIAGNOSIS — R0600 Dyspnea, unspecified: Secondary | ICD-10-CM

## 2012-11-12 DIAGNOSIS — Z124 Encounter for screening for malignant neoplasm of cervix: Secondary | ICD-10-CM

## 2012-11-12 DIAGNOSIS — B029 Zoster without complications: Secondary | ICD-10-CM

## 2012-11-12 DIAGNOSIS — Z0000 Encounter for general adult medical examination without abnormal findings: Secondary | ICD-10-CM

## 2012-11-12 DIAGNOSIS — E328 Other diseases of thymus: Secondary | ICD-10-CM

## 2012-11-12 DIAGNOSIS — R0989 Other specified symptoms and signs involving the circulatory and respiratory systems: Secondary | ICD-10-CM

## 2012-11-12 DIAGNOSIS — Z1151 Encounter for screening for human papillomavirus (HPV): Secondary | ICD-10-CM

## 2012-11-12 DIAGNOSIS — G4733 Obstructive sleep apnea (adult) (pediatric): Secondary | ICD-10-CM

## 2012-11-12 LAB — POCT URINALYSIS DIPSTICK
Blood, UA: NEGATIVE
Leukocytes, UA: NEGATIVE
Protein, UA: NEGATIVE
Urobilinogen, UA: NEGATIVE

## 2012-11-12 LAB — HEMOCCULT GUIAC POC 1CARD (OFFICE): Fecal Occult Blood, POC: NEGATIVE

## 2012-11-12 NOTE — Addendum Note (Signed)
Addended by: Mathews Robinsons on: 11/12/2012 01:40 PM   Modules accepted: Orders

## 2012-11-12 NOTE — Addendum Note (Signed)
Addended by: Mathews Robinsons on: 11/12/2012 02:11 PM   Modules accepted: Orders

## 2012-11-12 NOTE — Patient Instructions (Signed)
to go for CXR downstairs  Will refer to cardiology  For further assessment    See me in 6 month or as needed

## 2012-11-12 NOTE — Progress Notes (Signed)
Subjective:    Patient ID: Brianna Ross, female    DOB: 1955/11/04, 57 y.o.   MRN: [id number]  HPI Brianna Ross is here for CPE.    Since last visit she had an outbreak of facial complicated by opthalmic zoster in July.  She is undergoing treatment for this  With steroids and timolol     She also has an autoimmune disorder that Dr. Corliss Skains has been treating with Plaquenil  Keani describes intermittant dyspnea that occurs 2-3 times per month.  Associated with dizziness,  No chest pain or pressure,  Occasional palpitations. No LOC.  Strong FH of early MI in father and 2 uncles.   Father MI in his 43's  Uncle at 85 years.  She is a non smoker.  Lipids  slighlty  Elevated LDL   Last pap 2013  Dr. Primitivo Gauze  Atrophic vaginitis but scant cellularity    She also has been diagnosed with severe OSA  And is tolerating CPap    No Known Allergies Past Medical History  Diagnosis Date  . Allergy   . Sleep apnea   . Rosacea   . Depression   . Shingles 03/2012  . ANA positive   . Hives 2011    x5  . Urticaria   . Headache(784.0)   . OSA (obstructive sleep apnea)   . Menopause   . Atypical nevi    Past Surgical History  Procedure Laterality Date  . Thymic cyst      removal  . Wisdom tooth extraction     History   Social History  . Marital Status: Married    Spouse Name: N/A    Number of Children: 0  . Years of Education: 16   Occupational History  .     Social History Main Topics  . Smoking status: Never Smoker   . Smokeless tobacco: Never Used  . Alcohol Use: Yes     Comment: rarely  . Drug Use: No  . Sexual Activity: Yes    Birth Control/ Protection: Post-menopausal   Other Topics Concern  . Not on file   Social History Narrative   Patient works for [employer]. Patient lives at home with her husband Gaffer).   Family History  Problem Relation Age of Onset  . Glaucoma Mother   . Hyperlipidemia Mother   . Depression Mother   . Osteoporosis Mother   .  Heart disease Father   . Hyperlipidemia Father   . Hypertension Father   . Heart defect Sister   . Heart disease Sister 13    congenital heart defect,deceased  . Hyperlipidemia Brother   . Depression Brother   . High Cholesterol Brother   . Cancer Paternal Grandmother   . Stroke Paternal Grandfather   . Heart disease Paternal Grandfather    Patient Active Problem List   Diagnosis Date Noted  . Herpes zoster 11/12/2012  . 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 Dyspnea 11/12/2012  . Herpes zoster dermatitis of eyelid 04/21/2012  . Positive ANA (antinuclear antibody) 03/06/2012  . Elevated aldolase level 03/06/2012  . Myalgia 02/22/2012  . Myalgia and myositis 02/21/2012  . Obstructive sleep apnea 02/17/2012  . Headache 11/14/2011  . Atypical nevi 11/14/2011  . Depression 11/13/2011  . Menopause 11/13/2011  . Rosacea 11/13/2011  . Urticaria 11/13/2011  . 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 acetaminophen (TYLENOL) 325 MG tablet Take 650 mg by mouth  every 6 (six) hours as needed for pain.      Marland Kitchen betamethasone valerate (VALISONE) 0.1 % cream Apply topically as needed.      . desvenlafaxine (PRISTIQ) 50 MG 24 hr tablet Take 50 mg by mouth daily.      Marland Kitchen diltiazem 2 % GEL Apply topically 2 (two) times daily. As needed      . metroNIDAZOLE (METROGEL) 1 % gel Apply topically daily.      . prednisoLONE acetate (PRED FORTE) 1 % ophthalmic suspension 1 drop 4 (four) times daily.      . Alcaftadine (LASTACAFT) 0.25 % SOLN Apply to eye. Daily for eye allergy      . eszopiclone (LUNESTA) 2 MG TABS Take 1 tablet (2 mg total) by mouth at bedtime. Take immediately before bedtime  15 tablet  2  . Vitamin D, Ergocalciferol, (DRISDOL) 50000 UNITS CAPS Take 50,000 Units by mouth once a week.       No current facility-administered medications on file prior to visit.      Review of Systems  Respiratory:  Positive for shortness of breath. Negative for cough, choking, chest tightness and wheezing.   Cardiovascular: Positive for palpitations. Negative for chest pain.  All other systems reviewed and are negative.       Objective:   Physical Exam  Physical Exam  Vital signs and nursing note reviewed  Constitutional: She is oriented to person, place, and time. She appears well-developed and well-nourished. She is cooperative.  HENT:  Head: Normocephalic and atraumatic.  Right Ear: Tympanic membrane normal.  Left Ear: Tympanic membrane normal.  Nose: Nose normal.  Mouth/Throat: Oropharynx is clear and moist and mucous membranes are normal. No oropharyngeal exudate or posterior oropharyngeal erythema.  Eyes: Conjunctivae and EOM are normal. Pupils are equal, round, and reactive to light.  Neck: Neck supple. No JVD present. Carotid bruit is not present. No mass and no thyromegaly present.  Cardiovascular: Regular rhythm, normal heart sounds, intact distal pulses and normal pulses.  Exam reveals no gallop and no friction rub.   No murmur heard. Pulses:      Dorsalis pedis pulses are 2+ on the right side, and 2+ on the left side.  Pulmonary/Chest: Breath sounds normal. She has no wheezes. She has no rhonchi. She has no rales. Right breast exhibits no mass, no nipple discharge and no skin change. Left breast exhibits no mass, no nipple discharge and no skin change.  Abdominal: Soft. Bowel sounds are normal. She exhibits no distension and no mass. There is no hepatosplenomegaly. There is no tenderness. There is no CVA tenderness.  Genitourinary: Rectum normal, vagina normal and uterus normal. Rectal exam shows no mass. Guaiac negative stool. No labial fusion. There is no lesion on the right labia. There is no lesion on the left labia. Cervix exhibits no motion tenderness. Right adnexum displays no mass, no tenderness and no fullness. Left adnexum displays no mass, no tenderness and no fullness. No  erythema around the vagina.  Musculoskeletal:       No active synovitis to any joint.    Lymphadenopathy:       Right cervical: No superficial cervical adenopathy present.      Left cervical: No superficial cervical adenopathy present.       Right axillary: No pectoral and no lateral adenopathy present.       Left axillary: No pectoral and no lateral adenopathy present.      Right: No inguinal adenopathy present.  Left: No inguinal adenopathy present.  Neurological: She is alert and oriented to person, place, and time. She has normal strength and normal reflexes. No cranial nerve deficit or sensory deficit. She displays a negative Romberg sign. Coordination and gait normal.  Skin: Skin is warm and dry. No abrasion, no bruising, no ecchymosis and no rash noted. No cyanosis. Nails show no clubbing.  Psychiatric: She has a normal mood and affect. Her speech is normal and behavior is normal.          Assessment & Plan:  Health Maintenance  :  Since she is on Plaquenil will give pneumovax today.  Pap today  UTD with mm  Dyspnea  EKG no acute chages  NSR  TWI limited to V1  With strong FH of early MI on paternal side will refer for risk stratification and possible CAC scoring  Recent facial zoster. Counseled pt we must wait 8-12 months after an acute episode before we can administer vaccine.  She is to see her opthalmologist in 2 weeks and well discuss when complete healing.    Positive ANA  On  Plaquenil now  OSA on CPAP and tolerating fine   See me in 6 months or sooner prn       Assessment & Plan:

## 2012-11-13 ENCOUNTER — Telehealth: Payer: Self-pay | Admitting: *Deleted

## 2012-11-13 NOTE — Telephone Encounter (Signed)
Calling to give Brianna Ross her normal Chest Xray results

## 2012-11-13 NOTE — Telephone Encounter (Signed)
Left Message with husband for Khamryn to give [place] a call back.

## 2012-11-17 ENCOUNTER — Encounter: Payer: Self-pay | Admitting: *Deleted

## 2012-11-17 ENCOUNTER — Telehealth: Payer: Self-pay | Admitting: *Deleted

## 2012-11-17 ENCOUNTER — Encounter: Payer: Self-pay | Admitting: Internal Medicine

## 2012-11-17 DIAGNOSIS — N952 Postmenopausal atrophic vaginitis: Secondary | ICD-10-CM | POA: Insufficient documentation

## 2012-11-17 NOTE — Telephone Encounter (Signed)
Message copied by Mathews Robinsons on Mon Nov 17, 2012  2:37 PM ------      Message from: Raechel Chute D      Created: Mon Nov 17, 2012  8:27 AM       Karen Kitchens            Call Amberlee and let her know that her pap is fine and she has thinning of the vaginal lining  (atrophic vaginitis).  Ok to mail labs to her.  Leatrice Jewels should help  If it does not she can see me in office ------

## 2012-11-17 NOTE — Telephone Encounter (Signed)
LVM message for pt to return call for recent lab results. Also mailed copy to home address

## 2012-11-18 ENCOUNTER — Telehealth: Payer: Self-pay | Admitting: *Deleted

## 2012-11-18 ENCOUNTER — Encounter: Payer: Self-pay | Admitting: *Deleted

## 2012-11-18 NOTE — Telephone Encounter (Signed)
Message copied by Malena Catholic on Tue Nov 18, 2012 12:50 PM ------      Message from: Raechel Chute D      Created: Thu Nov 13, 2012  2:14 PM       Jakyria Bleau let pt know her CXR is normal. ------

## 2012-11-18 NOTE — Telephone Encounter (Signed)
Called patient and left message for patient to return our call on 11/12/12.  Patient called back today 11/18/12 and was given CXR results by [facility].

## 2012-11-18 NOTE — Telephone Encounter (Signed)
Called patient and left message about her appointment with Dr Allyson Sabal on her Voicemail.

## 2012-12-04 ENCOUNTER — Telehealth: Payer: Self-pay | Admitting: Neurology

## 2012-12-04 NOTE — Telephone Encounter (Signed)
Left message for patient to call and reschedule 2/11 appt per Dr. Oliva Bustard schedule.

## 2012-12-05 ENCOUNTER — Emergency Department (HOSPITAL_COMMUNITY)
Admission: EM | Admit: 2012-12-05 | Discharge: 2012-12-06 | Disposition: A | Discharge status: Home / Self Care | Payer: BC Managed Care – PPO | Attending: Emergency Medicine | Admitting: Emergency Medicine

## 2012-12-05 ENCOUNTER — Emergency Department (HOSPITAL_COMMUNITY)
Admission: EM | Admit: 2012-12-05 | Discharge: 2012-12-05 | Disposition: A | Discharge status: Other Acute Inpatient Hospital | Payer: BC Managed Care – PPO | Source: Home / Self Care | Attending: Emergency Medicine | Admitting: Emergency Medicine

## 2012-12-05 ENCOUNTER — Encounter (HOSPITAL_COMMUNITY): Payer: Self-pay | Admitting: Emergency Medicine

## 2012-12-05 ENCOUNTER — Ambulatory Visit: Payer: 59 | Admitting: Cardiovascular Disease

## 2012-12-05 ENCOUNTER — Emergency Department (INDEPENDENT_AMBULATORY_CARE_PROVIDER_SITE_OTHER): Payer: BC Managed Care – PPO

## 2012-12-05 ENCOUNTER — Telehealth: Payer: Self-pay | Admitting: *Deleted

## 2012-12-05 DIAGNOSIS — Z872 Personal history of diseases of the skin and subcutaneous tissue: Secondary | ICD-10-CM | POA: Insufficient documentation

## 2012-12-05 DIAGNOSIS — R0609 Other forms of dyspnea: Secondary | ICD-10-CM

## 2012-12-05 DIAGNOSIS — F329 Major depressive disorder, single episode, unspecified: Secondary | ICD-10-CM | POA: Insufficient documentation

## 2012-12-05 DIAGNOSIS — Z8742 Personal history of other diseases of the female genital tract: Secondary | ICD-10-CM | POA: Insufficient documentation

## 2012-12-05 DIAGNOSIS — R0989 Other specified symptoms and signs involving the circulatory and respiratory systems: Secondary | ICD-10-CM | POA: Insufficient documentation

## 2012-12-05 DIAGNOSIS — IMO0002 Reserved for concepts with insufficient information to code with codable children: Secondary | ICD-10-CM | POA: Insufficient documentation

## 2012-12-05 DIAGNOSIS — Z8619 Personal history of other infectious and parasitic diseases: Secondary | ICD-10-CM | POA: Insufficient documentation

## 2012-12-05 DIAGNOSIS — Z8669 Personal history of other diseases of the nervous system and sense organs: Secondary | ICD-10-CM | POA: Insufficient documentation

## 2012-12-05 DIAGNOSIS — F3289 Other specified depressive episodes: Secondary | ICD-10-CM | POA: Insufficient documentation

## 2012-12-05 DIAGNOSIS — Z79899 Other long term (current) drug therapy: Secondary | ICD-10-CM | POA: Insufficient documentation

## 2012-12-05 DIAGNOSIS — R0600 Dyspnea, unspecified: Secondary | ICD-10-CM

## 2012-12-05 DIAGNOSIS — Z7982 Long term (current) use of aspirin: Secondary | ICD-10-CM | POA: Insufficient documentation

## 2012-12-05 LAB — BASIC METABOLIC PANEL
BUN: 10 mg/dL (ref 6–23)
Calcium: 10.1 mg/dL (ref 8.4–10.5)
Chloride: 106 mEq/L (ref 96–112)
GFR calc Af Amer: >90 mL/min (ref >90)
GFR calc non Af Amer: >90 mL/min (ref >90)
Potassium: 3.5 mEq/L (ref 3.5–5.1)

## 2012-12-05 LAB — CBC
HCT: 43.7 % (ref 36.0–46.0)
MCH: 31 pg (ref 26.0–34.0)
MCHC: 35 g/dL (ref 30.0–36.0)
Platelets: 247 10*3/uL (ref 150–400)
RDW: 13.1 % (ref 11.5–15.5)
WBC: 4.7 10*3/uL (ref 4.0–10.5)

## 2012-12-05 LAB — POCT I-STAT TROPONIN I: Troponin i, poc: 0 ng/mL (ref 0.00–0.08)

## 2012-12-05 NOTE — ED Notes (Addendum)
Pt from [facility]> C/o sob x 4 days with non-productive cough.  Pt denies chest pain, nausea, vomiting.  States SOB is not associated with exertion.  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 EKG and chest x-ray completed at [facility].

## 2012-12-05 NOTE — Telephone Encounter (Signed)
Pt called in this am with head congestion cough and Shortness of Breath x one week advised pt to go to [facility]

## 2012-12-05 NOTE — ED Notes (Signed)
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

## 2012-12-05 NOTE — ED Provider Notes (Signed)
Chief Complaint:   Chief Complaint  Patient presents with  . Shortness of Breath    History of Present Illness:   Brianna Ross is a 57 year old female with a history of anticardiolipin antibody who presents with a four-day history of shortness of breath. This occurs at rest, with exertion, at nighttime. She's had some tightness, but no wheezing. She feels weak and rundown. She denies any chest pain. She had an upper respiratory infection about 6-7 days ago with sore throat, nasal congestion, clear rhinorrhea, but this seems to be getting better. She has been left with a nonproductive cough. She's had no fever or chills. She does have a history of asthma but has not used her inhaler recently. She denies any extremity pain or swelling or prolonged immobilization or car or plane trips. She has had no prior history of deep venous thrombophlebitis or pulmonary embolism. She denies any history of cardiac problems.  Review of Systems:  Other than noted above, the patient denies any of the following symptoms. Systemic:  No fever, chills, sweats, or fatigue. ENT:  No nasal congestion, rhinorrhea, or sore throat. Pulmonary:  No cough, wheezing, shortness of breath, sputum production, hemoptysis. Cardiac:  No palpitations, rapid heartbeat, dizziness, presyncope or syncope. Skin:  No rash or itching. Ext:  No leg pain or swelling. Psych:  No anxiety or depression.  PMFSH:  Past medical history, family history, social history, meds, and allergies were reviewed and updated as needed. The patient denies any history of asthma, lung disease, heart disease, anxiety, or tobacco use. She has no medication allergies. She takes Plaquenil, aspirin, prostate, and Valtrex. She has obstructive sleep apnea and uses CPAP, a history of depression, shingles, a positive ANA, positive anticardiolipin antibody.  Physical Exam:   Vital signs:  BP 129/79  Pulse 86  Temp(Src) 98 F (36.7 C) (Oral)  Resp 16  SpO2 98% Gen:   Alert, oriented, in no distress, skin warm and dry. Eye:  PERRL, lids and conjunctivas normal.  Sclera non-icteric. ENT:  Mucous membranes moist, pharynx clear. Neck:  Supple, no adenopathy or tenderness.  No JVD. Lungs:  Clear to auscultation and percussion, no wheezes, rales or rhonchi.  No respiratory distress. Heart:  Regular rhythm.  No gallops, murmers, clicks or rubs. Abdomen:  Soft, nontender, no organomegaly or mass.  Bowel sounds normal.  No pulsatile abdominal mass or bruit. Ext:  No edema.  No calf tenderness and Homann's sign negative.  Pulses full and equal. Skin:  Warm and dry.  No rash.     Radiology:  Dg Chest 2 View  12/05/2012   CLINICAL DATA:  Shortness of breath.  EXAM: CHEST  2 VIEW  COMPARISON:  11/12/2012.  FINDINGS: The heart remains normal in size and the lungs are clear. Minimal scoliosis.  IMPRESSION: No acute abnormality.   Electronically Signed   By: Gordan Payment M.D.   On: 12/05/2012 17:50   EKG:   Date: 12/05/2012  Rate: 76  Rhythm: normal sinus rhythm  QRS Axis: normal  Intervals: normal  ST/T Wave abnormalities: nonspecific ST changes  Conduction Disutrbances:none  Narrative Interpretation: Normal sinus rhythm, nonspecific ST abnormality, otherwise normal.  Old EKG Reviewed: none available  Assessment:  The encounter diagnosis was Dyspnea.  She has unexplained dyspnea and a history of anticardiolipin antibody. She is at high risk for pulmonary embolism and I think this needs to be ruled out.  Plan:  The patient was transferred to the ED via shuttle in stable condition.  Medical Decision Making:  Pt is a 57 year old female with an anticardiolipin antibody who presents tonight with a 4 day history of shortness of breath and weakness.  She denies any wheezing, fever, or chest pain.  She has had a recent URI which she is getting over except she has a residual non-productive cough.  Her PE and CXR are WNL.  Her EKG shows minimal, non-specific ST changes.  I  am concerned about the possibility of PE since she has the anticardiolipin antibody.  I think she needs a spiral chest CT to rule this out.     Reuben Likes, MD 12/05/12 [phone]

## 2012-12-06 ENCOUNTER — Emergency Department (HOSPITAL_COMMUNITY): Payer: BC Managed Care – PPO

## 2012-12-06 ENCOUNTER — Encounter (HOSPITAL_COMMUNITY): Payer: Self-pay | Admitting: Radiology

## 2012-12-06 MED ORDER — ASPIRIN 81 MG PO CHEW
324.0000 mg | CHEWABLE_TABLET | Freq: Once | ORAL | Status: AC
  Administered 2012-12-06: 324 mg via ORAL
  Filled 2012-12-06: qty 4

## 2012-12-06 MED ORDER — ALBUTEROL SULFATE HFA 108 (90 BASE) MCG/ACT IN AERS
2.0000 | INHALATION_SPRAY | RESPIRATORY_TRACT | Status: DC | PRN
  Administered 2012-12-06: 2 via RESPIRATORY_TRACT
  Filled 2012-12-06: qty 6.7

## 2012-12-06 MED ORDER — IOHEXOL 350 MG/ML SOLN
80.0000 mL | Freq: Once | INTRAVENOUS | Status: AC | PRN
  Administered 2012-12-06: 64 mL via INTRAVENOUS

## 2012-12-06 NOTE — ED Notes (Signed)
Returned from CT.  EDT into room for EKG.

## 2012-12-06 NOTE — ED Provider Notes (Signed)
CSN: [id number]     Arrival date & time 12/05/12  1929 History   First MD Initiated Contact with Patient 12/05/12 2354     Chief Complaint  Patient presents with  . Shortness of Breath   (Consider location/radiation/quality/duration/timing/severity/associated sxs/prior Treatment) HPI Hx per PT  - h/o anticardiolipin, no h/o DVT or PE.  Sick for the last 4-5 days with URO symptoms, feels like symptoms are improving, but still some SOB. No wheezes, no leg pain or swelling. No F/C, no CP. She presented to urgent care requesting inhaler, sent here for PE study.  She had an EKG, CXR PTA today.  Her symptoms are mild in severity.   Past Medical History  Diagnosis Date  . Allergy   . Sleep apnea   . Rosacea   . Depression   . Shingles 03/2012  . ANA positive   . Hives 2011    x5  . Urticaria   . Headache(784.0)   . OSA (obstructive sleep apnea)   . Menopause   . Atypical nevi    Past Surgical History  Procedure Laterality Date  . Thymic cyst      removal  . Wisdom tooth extraction     Family History  Problem Relation Age of Onset  . Glaucoma Mother   . Hyperlipidemia Mother   . Depression Mother   . Osteoporosis Mother   . Heart disease Father   . Hyperlipidemia Father   . Hypertension Father   . Heart defect Sister   . Heart disease Sister 13    congenital heart defect,deceased  . Hyperlipidemia Brother   . Depression Brother   . High Cholesterol Brother   . Cancer Paternal Grandmother   . Stroke Paternal Grandfather   . Heart disease Paternal Grandfather    History  Substance Use Topics  . Smoking status: Never Smoker   . Smokeless tobacco: Never Used  . Alcohol Use: Yes     Comment: rarely   OB History   Grav Para Term Preterm Abortions TAB SAB Ect Mult Living                 Review of Systems  Constitutional: Negative for fever and chills.  Eyes: Negative for pain.  Respiratory: Positive for shortness of breath.   Cardiovascular: Negative for chest  pain.  Gastrointestinal: Negative for abdominal pain.  Genitourinary: Negative for dysuria.  Musculoskeletal: Negative for back pain, neck pain and neck stiffness.  Skin: Negative for rash.  Neurological: Negative for headaches.  All other systems reviewed and are negative.    Allergies  Review of patient's allergies indicates no known allergies.  Home Medications   Current Outpatient Rx  Name  Route  Sig  Dispense  Refill  . acetaminophen (TYLENOL) 500 MG tablet   Oral   Take 500 mg by mouth 2 (two) times daily as needed (pain).         Marland Kitchen aspirin EC 81 MG tablet   Oral   Take 81 mg by mouth at bedtime.          . betamethasone valerate (VALISONE) 0.1 % cream   Topical   Apply 1 application topically daily as needed (vaginal irritation).          Marland Kitchen desvenlafaxine (PRISTIQ) 50 MG 24 hr tablet   Oral   Take 50 mg by mouth daily.         . dorzolamide-timolol (COSOPT) 22.3-6.8 MG/ML ophthalmic solution   Left Eye   Place  1 drop into the left eye 2 (two) times daily.          . hydroxychloroquine (PLAQUENIL) 200 MG tablet   Oral   Take 200 mg by mouth 2 (two) times daily.          . hydrOXYzine (ATARAX/VISTARIL) 25 MG tablet   Oral   Take 25 mg by mouth daily as needed for anxiety.          . metroNIDAZOLE (METROGEL) 1 % gel   Topical   Apply 1 application topically 3 (three) times a week. Use any 3 days weekly         . prednisoLONE acetate (PRED FORTE) 1 % ophthalmic suspension   Left Eye   Place 1 drop into the left eye daily.          . valACYclovir (VALTREX) 1000 MG tablet   Oral   Take 1,000 mg by mouth at bedtime.           BP 113/78  Pulse 82  Temp(Src) 98.3 F (36.8 C) (Oral)  Resp 16  Wt 144 lb (65.318 kg)  SpO2 97% Physical Exam  Constitutional: She is oriented to person, place, and time. She appears well-developed and well-nourished.  HENT:  Head: Normocephalic and atraumatic.  Eyes: EOM are normal. Pupils are equal,  round, and reactive to light.  Neck: Neck supple.  Cardiovascular: Normal rate, regular rhythm and intact distal pulses.   Pulmonary/Chest: Effort normal and breath sounds normal. No respiratory distress. She has no wheezes. She has no rales. She exhibits no tenderness.  Abdominal: Soft. Bowel sounds are normal. She exhibits no distension. There is no tenderness.  Musculoskeletal: Normal range of motion. She exhibits no edema.  No calf tenderness, erythema or swelling  Neurological: She is alert and oriented to person, place, and time.  Skin: Skin is warm and dry.    ED Course  Procedures (including critical care time) Labs Review Labs Reviewed  CBC - Abnormal; Notable for the following:    Hemoglobin 15.3 (*)    All other components within normal limits  BASIC METABOLIC PANEL - Abnormal; Notable for the following:    Sodium 146 (*)    All other components within normal limits  PRO B NATRIURETIC PEPTIDE  POCT I-STAT TROPONIN I   Imaging Review Dg Chest 2 View  12/05/2012   CLINICAL DATA:  Shortness of breath.  EXAM: CHEST  2 VIEW  COMPARISON:  11/12/2012.  FINDINGS: The heart remains normal in size and the lungs are clear. Minimal scoliosis.  IMPRESSION: No acute abnormality.   Electronically Signed   By: Gordan Payment M.D.   On: 12/05/2012 17:50    EKG Interpretation    Date/Time:  Saturday December 06 2012 00:55:55 EST Ventricular Rate:  74 PR Interval:  155 QRS Duration: 83 QT Interval:  426 QTC Calculation: 473 R Axis:     Text Interpretation:  Sinus rhythm ST elevation 3 and aVF with some ST depression Abnormal ECG Confirmed by Ramel Tobon  MD, Jacquelene Kopecky ([id number]) on 12/06/2012 1:06:13 AM           1:05 AM EKG reviewed as above. On evaluation patient is asymptomatic without chest pain or shortness of breath. She denies any previous cardiac history. Has strong family history of same. She takes baby aspirin daily.Cardiology consulted. 1:20 AM d/w DR Adolm Joseph, agrees no code STEMI  if asymptomatic - he will review ECGs and evaluate bedside. EKG noted to be arm leg lead reversal  and on repeat is unchanged from previous EKG.  She scheduled [facility] Dr. Allyson Sabal.  Given asymptomatic with negative serial troponins and on further evaluation no changes on ECG, plan discharge home with outpatient followup. Patient agreeable to plan, is a reliable historian, and agrees to strict return precautions. she'll continue aspirin and medications as prescribed   MDM  Diagnosis: Dyspnea  Evaluated with EKG, labs and imaging reviewed as above EKGs reviewed as above and cardiology consult vital signs and nursing and considered.    Sunnie Nielsen, MD 12/06/12 [phone]

## 2012-12-06 NOTE — ED Notes (Signed)
Patient transported to CT 

## 2012-12-10 ENCOUNTER — Encounter: Payer: Self-pay | Admitting: Cardiovascular Disease

## 2012-12-10 ENCOUNTER — Ambulatory Visit (INDEPENDENT_AMBULATORY_CARE_PROVIDER_SITE_OTHER): Payer: BC Managed Care – PPO | Admitting: Cardiovascular Disease

## 2012-12-10 VITALS — BP 126/80 | HR 88 | Ht 65.25 in | Wt 142.0 lb

## 2012-12-10 DIAGNOSIS — R0602 Shortness of breath: Secondary | ICD-10-CM

## 2012-12-10 DIAGNOSIS — Z8249 Family history of ischemic heart disease and other diseases of the circulatory system: Secondary | ICD-10-CM | POA: Insufficient documentation

## 2012-12-10 NOTE — Patient Instructions (Signed)
  We will see you back in follow up as needed unless the test comes back abnormal.  Dr Allyson Sabal has ordered an echocardiogram and treadmill test

## 2012-12-10 NOTE — Progress Notes (Signed)
12/10/2012 Brianna Ross   10-02-1955  [id number]  Primary Physician Ross,DEBBIE, MD Primary Cardiologist: Runell Gess MD Brianna Ross   HPI:  Brianna Ross is a 57 year old married Caucasian female with no children he works as a Engineer, civil (consulting). She was referred by Dr. Constance Ross for cardiovascular evaluation because of several episodes of sudden onset shortness of breath. She has no cardiac risk factors other than family history the father had his first MI at age 40 and subsequent bypass grafting and valve replacement at age 35. She denies chest pain. She had 2 episodes of sudden onset shortness of breath when approached resulted in any Urgentcare visit that was unrevealing. A CT in June and was performed to rule out pulmonary embolus.   Current Outpatient Prescriptions  Medication Sig Dispense Refill  . acetaminophen (TYLENOL) 500 MG tablet Take 500 mg by mouth 2 (two) times daily as needed (pain).      Marland Kitchen aspirin EC 81 MG tablet Take 81 mg by mouth at bedtime.       . betamethasone valerate (VALISONE) 0.1 % cream Apply 1 application topically daily as needed (vaginal irritation).       Marland Kitchen desvenlafaxine (PRISTIQ) 50 MG 24 hr tablet Take 50 mg by mouth daily.      . dorzolamide-timolol (COSOPT) 22.3-6.8 MG/ML ophthalmic solution Place 1 drop into the left eye 2 (two) times daily.       . hydroxychloroquine (PLAQUENIL) 200 MG tablet Take 200 mg by mouth 2 (two) times daily.       . hydrOXYzine (ATARAX/VISTARIL) 25 MG tablet Take 25 mg by mouth daily as needed for anxiety.       . metroNIDAZOLE (METROGEL) 1 % gel Apply 1 application topically 3 (three) times a week. Use any 3 days weekly      . prednisoLONE acetate (PRED FORTE) 1 % ophthalmic suspension Place 1 drop into the left eye daily.       . valACYclovir (VALTREX) 1000 MG tablet Take 1,000 mg by mouth at bedtime.        No current facility-administered medications for this visit.    No Known Allergies  History   Social  History  . Marital Status: Married    Spouse Name: N/A    Number of Children: 0  . Years of Education: 16   Occupational History  .     Social History Main Topics  . Smoking status: Never Smoker   . Smokeless tobacco: Never Used  . Alcohol Use: Yes     Comment: rarely  . Drug Use: No  . Sexual Activity: Yes    Birth Control/ Protection: Post-menopausal   Other Topics Concern  . Not on file   Social History Narrative   Patient works for [employer]. Patient lives at home with her husband Brianna Ross).     Review of Systems: General: negative for chills, fever, night sweats or weight changes.  Cardiovascular: negative for chest pain, dyspnea on exertion, edema, orthopnea, palpitations, paroxysmal nocturnal dyspnea or shortness of breath Dermatological: negative for rash Respiratory: negative for cough or wheezing Urologic: negative for hematuria Abdominal: negative for nausea, vomiting, diarrhea, bright red blood per rectum, melena, or hematemesis Neurologic: negative for visual changes, syncope, or dizziness All other systems reviewed and are otherwise negative except as noted above.    Blood pressure 126/80, pulse 88, height 5' 5.25" (1.657 m), weight 142 lb (64.411 kg).  General appearance: alert and no distress Neck: no adenopathy, no  carotid bruit, no JVD, supple, symmetrical, trachea midline and thyroid not enlarged, symmetric, no tenderness/mass/nodules Lungs: clear to auscultation bilaterally Heart: regular rate and rhythm, S1, S2 normal, no murmur, click, rub or gallop Abdomen: soft, non-tender; bowel sounds normal; no masses,  no organomegaly Extremities: extremities normal, atraumatic, no cyanosis or edema Pulses: 2+ and symmetric  EKG performed 12/06/12 revealed sinus rhythm without ST or T wave changes  ASSESSMENT AND PLAN:   Shortness of breath Brianna Ross is a 57 year old married Caucasian female no children he works as a Engineer, 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 Constance Ross, her primary care physician, evaluation of several episodes of sudden onset of shortness of breath.she is no cardiovascular risk factors other than family history. Her father had his first MI age 29 and had coronary artery bypass grafting and aortic valve replacement at age 26. She does not smoke. She is not diabetic, hypertensive. She has mild hyperlipidemia with LDL of 1.9. She said 2 episodes of sudden onset of shortness of breath 1 requiring a urgent care visit her. Her EKGs were normal and her enzymes were negative. A CT angiogram ruled out pulmonary embolus.she's had no recurrent symptoms. I am going to get a 2-D echocardiogram and a routine Bruce protocol exercise stress test.      Runell Gess MD [facility], [facility] 12/10/2012 3:53 PM

## 2012-12-10 NOTE — Assessment & Plan Note (Signed)
Ms. 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 Constance Goltz, her primary care physician, evaluation of several episodes of sudden onset of shortness of breath.she is no cardiovascular risk factors other than family history. Her father had his first MI age 1 and had coronary artery bypass grafting and aortic valve replacement at age 88. She does not smoke. She is not diabetic, hypertensive. She has mild hyperlipidemia with LDL of 1.9. She said 2 episodes of sudden onset of shortness of breath 1 requiring a urgent care visit her. Her EKGs were normal and her enzymes were negative. A CT angiogram ruled out pulmonary embolus.she's had no recurrent symptoms. I am going to get a 2-D echocardiogram and a routine Bruce protocol exercise stress test.

## 2012-12-30 ENCOUNTER — Ambulatory Visit (HOSPITAL_COMMUNITY)
Admission: RE | Admit: 2012-12-30 | Discharge: 2012-12-30 | Disposition: A | Discharge status: Home / Self Care | Payer: BC Managed Care – PPO | Source: Ambulatory Visit | Attending: Cardiovascular Disease | Admitting: Cardiovascular Disease

## 2012-12-30 DIAGNOSIS — I059 Rheumatic mitral valve disease, unspecified: Secondary | ICD-10-CM | POA: Insufficient documentation

## 2012-12-30 DIAGNOSIS — I079 Rheumatic tricuspid valve disease, unspecified: Secondary | ICD-10-CM | POA: Insufficient documentation

## 2012-12-30 DIAGNOSIS — R0602 Shortness of breath: Secondary | ICD-10-CM

## 2012-12-30 DIAGNOSIS — Z8249 Family history of ischemic heart disease and other diseases of the circulatory system: Secondary | ICD-10-CM | POA: Insufficient documentation

## 2012-12-30 DIAGNOSIS — R0609 Other forms of dyspnea: Secondary | ICD-10-CM | POA: Insufficient documentation

## 2012-12-30 DIAGNOSIS — R0989 Other specified symptoms and signs involving the circulatory and respiratory systems: Secondary | ICD-10-CM | POA: Insufficient documentation

## 2012-12-30 DIAGNOSIS — I359 Nonrheumatic aortic valve disorder, unspecified: Secondary | ICD-10-CM | POA: Insufficient documentation

## 2012-12-30 NOTE — Progress Notes (Signed)
2D Echo Performed 12/30/2012    Torry Istre, RCS  

## 2013-01-02 ENCOUNTER — Encounter (HOSPITAL_COMMUNITY): Payer: BC Managed Care – PPO

## 2013-01-06 ENCOUNTER — Encounter: Payer: Self-pay | Admitting: *Deleted

## 2013-01-06 ENCOUNTER — Telehealth (HOSPITAL_COMMUNITY): Payer: Self-pay | Admitting: *Deleted

## 2013-01-07 ENCOUNTER — Other Ambulatory Visit (HOSPITAL_COMMUNITY): Payer: Self-pay | Admitting: Cardiovascular Disease

## 2013-01-07 DIAGNOSIS — Z8249 Family history of ischemic heart disease and other diseases of the circulatory system: Secondary | ICD-10-CM

## 2013-01-07 DIAGNOSIS — R0602 Shortness of breath: Secondary | ICD-10-CM

## 2013-02-02 ENCOUNTER — Telehealth (HOSPITAL_COMMUNITY): Payer: Self-pay | Admitting: *Deleted

## 2013-02-11 ENCOUNTER — Ambulatory Visit: Payer: 59 | Admitting: Neurology

## 2013-02-18 ENCOUNTER — Encounter (HOSPITAL_COMMUNITY): Payer: BC Managed Care – PPO

## 2013-02-18 ENCOUNTER — Ambulatory Visit (HOSPITAL_COMMUNITY)
Admission: RE | Admit: 2013-02-18 | Discharge: 2013-02-18 | Disposition: A | Discharge status: Home / Self Care | Payer: BC Managed Care – PPO | Source: Ambulatory Visit | Attending: Cardiovascular Disease | Admitting: Cardiovascular Disease

## 2013-02-18 DIAGNOSIS — R0602 Shortness of breath: Secondary | ICD-10-CM

## 2013-02-18 DIAGNOSIS — R079 Chest pain, unspecified: Secondary | ICD-10-CM | POA: Insufficient documentation

## 2013-02-19 ENCOUNTER — Other Ambulatory Visit (HOSPITAL_COMMUNITY): Payer: Self-pay | Admitting: Cardiovascular Disease

## 2013-02-19 DIAGNOSIS — R079 Chest pain, unspecified: Secondary | ICD-10-CM

## 2013-03-06 DIAGNOSIS — H1789 Other corneal scars and opacities: Secondary | ICD-10-CM | POA: Insufficient documentation

## 2013-03-06 DIAGNOSIS — H179 Unspecified corneal scar and opacity: Secondary | ICD-10-CM

## 2013-03-06 DIAGNOSIS — B0239 Other herpes zoster eye disease: Secondary | ICD-10-CM | POA: Insufficient documentation

## 2013-04-15 ENCOUNTER — Telehealth: Payer: Self-pay | Admitting: Cardiovascular Disease

## 2013-04-15 NOTE — Telephone Encounter (Signed)
Message forwarded to Kathryn, RN to discuss w/ Dr. Berry.  

## 2013-04-15 NOTE — Telephone Encounter (Signed)
Per answering service-Pt had a stress test on 2-18,she said she never got her results.

## 2013-04-16 NOTE — Telephone Encounter (Signed)
Dr Gwenlyn Found reviewed the resutls and patient needs to come in for an office visit.    lmom for patient

## 2013-04-24 NOTE — Telephone Encounter (Signed)
lmom  Letter mailed

## 2013-04-27 ENCOUNTER — Telehealth: Payer: Self-pay | Admitting: Cardiovascular Disease

## 2013-04-27 NOTE — Telephone Encounter (Signed)
Returning your call-concerning her test results.

## 2013-04-27 NOTE — Telephone Encounter (Signed)
I spoke with patient and made her an appt for 4/29

## 2013-04-29 ENCOUNTER — Ambulatory Visit (INDEPENDENT_AMBULATORY_CARE_PROVIDER_SITE_OTHER): Payer: BC Managed Care – PPO | Admitting: Cardiovascular Disease

## 2013-04-29 ENCOUNTER — Encounter: Payer: Self-pay | Admitting: Cardiovascular Disease

## 2013-04-29 VITALS — BP 113/80 | HR 83 | Ht 65.0 in | Wt 145.2 lb

## 2013-04-29 DIAGNOSIS — R9439 Abnormal result of other cardiovascular function study: Secondary | ICD-10-CM

## 2013-04-29 DIAGNOSIS — R0602 Shortness of breath: Secondary | ICD-10-CM

## 2013-04-29 DIAGNOSIS — E785 Hyperlipidemia, unspecified: Secondary | ICD-10-CM

## 2013-04-29 NOTE — Assessment & Plan Note (Signed)
A 2-D echocardiogram was normal except for mild MR and grade 1 diastolic dysfunction. She did have a negative CT angiogram back late last year to rule out pulmonary embolus. Routine GXT did show 1 mm of ST segment depression. While this may be a false positive a female I suggest that we proceed with nuclear imaging without given her family history.

## 2013-04-29 NOTE — Assessment & Plan Note (Signed)
Patient does have a positive family history for heart disease and an LDL of 129. She scheduled to see Dr. Coralyn Mark later this month. At that point I suggest a conversation happens with her regarding initiation of statin therapy to reach LDL primary prevention goal.

## 2013-04-29 NOTE — Patient Instructions (Signed)
  We will see you back in follow up only as needed.   Dr Gwenlyn Found has ordered : Exercise Myoview- this is a test that looks at the blood flow to your heart muscle.  It takes approximately 2 1/2 hours. Please follow instruction sheet, as given.

## 2013-04-29 NOTE — Progress Notes (Signed)
04/29/2013 Brianna Ross   Oct 10, 1955  [id number]  Primary Physician Brianna Pillar, MD Primary Cardiologist: Lorretta Harp MD Renae Gloss   HPI:  Brianna Ross is a 58 year old married Caucasian female with no children he works as a Marine scientist. She was referred by Dr. Coralyn Ross for cardiovascular evaluation because of several episodes of sudden onset shortness of breath. She has no cardiac risk factors other than family history the father had his first MI at age 31 and subsequent bypass grafting and valve replacement at age 12. She denies chest pain. She had 2 episodes of sudden onset shortness of breath when approached resulted in any Urgentcare visit that was unrevealing. A CT in June and was performed to rule out pulmonary embolus. Since I saw her in December a 2-D echocardiogram was performed that was essentially normal. A routine GXT did however show 1 mm of ST segment depression at peak exercise. This was considered an abnormal test for ischemia.    Current Outpatient Prescriptions  Medication Sig Dispense Refill  . acetaminophen (TYLENOL) 500 MG tablet Take 500 mg by mouth 2 (two) times daily as needed (pain).      Marland Kitchen aspirin EC 81 MG tablet Take 81 mg by mouth at bedtime.       . betamethasone valerate (VALISONE) 0.1 % cream Apply 1 application topically daily as needed (vaginal irritation).       Marland Kitchen desvenlafaxine (PRISTIQ) 50 MG 24 hr tablet Take 50 mg by mouth daily.      . dorzolamide-timolol (COSOPT) 22.3-6.8 MG/ML ophthalmic solution Place 1 drop into the left eye 2 (two) times daily.       . fluorometholone (FML) 0.1 % ophthalmic suspension Place 1 drop into the left eye daily.      . hydroxychloroquine (PLAQUENIL) 200 MG tablet Take 200 mg by mouth 2 (two) times daily.       . hydrOXYzine (ATARAX/VISTARIL) 25 MG tablet Take 25 mg by mouth daily as needed for anxiety.       . metroNIDAZOLE (METROGEL) 1 % gel Apply 1 application topically 3 (three) times a week. Use any  3 days weekly      . tacrolimus (PROTOPIC) 0.1 % ointment Apply 1 application topically 2 (two) times daily.      . valACYclovir (VALTREX) 1000 MG tablet Take 1,000 mg by mouth at bedtime.        No current facility-administered medications for this visit.    No Known Allergies  History   Social History  . Marital Status: Married    Spouse Name: N/A    Number of Children: 0  . Years of Education: 16   Occupational History  .     Social History Main Topics  . Smoking status: Never Smoker   . Smokeless tobacco: Never Used  . Alcohol Use: Yes     Comment: rarely  . Drug Use: No  . Sexual Activity: Yes    Birth Control/ Protection: Post-menopausal   Other Topics Concern  . Not on file   Social History Narrative   Patient works for [employer]. Patient lives at home with her husband Patent examiner).     Review of Systems: General: negative for chills, fever, night sweats or weight changes.  Cardiovascular: negative for chest pain, dyspnea on exertion, edema, orthopnea, palpitations, paroxysmal nocturnal dyspnea or shortness of breath Dermatological: negative for rash Respiratory: negative for cough or wheezing Urologic: negative for hematuria Abdominal: negative for nausea, vomiting, diarrhea, bright red  blood per rectum, melena, or hematemesis Neurologic: negative for visual changes, syncope, or dizziness All other systems reviewed and are otherwise negative except as noted above.    Blood pressure 113/80, pulse 83, height 5\' 5"  (1.651 m), weight 145 lb 3.2 oz (65.862 kg).  General appearance: alert and no distress Neck: no adenopathy, no carotid bruit, no JVD, supple, symmetrical, trachea midline and thyroid not enlarged, symmetric, no tenderness/mass/nodules Lungs: clear to auscultation bilaterally Heart: regular rate and rhythm, S1, S2 normal, no murmur, click, rub or gallop Extremities: extremities normal, atraumatic, no cyanosis or edema  EKG not  performed today  ASSESSMENT AND PLAN:   Hyperlipidemia Patient does have a positive family history for heart disease and an LDL of 129. She scheduled to see Dr. Coralyn Ross later this month. At that point I suggest a conversation happens with her regarding initiation of statin therapy to reach LDL primary prevention goal.  Shortness of breath A 2-D echocardiogram was normal except for mild MR and grade 1 diastolic dysfunction. She did have a negative CT angiogram back late last year to rule out pulmonary embolus. Routine GXT did show 1 mm of ST segment depression. While this may be a false positive a female I suggest that we proceed with nuclear imaging without given her family history.      Lorretta Harp MD FACP,FACC,FAHA, [facility] 04/29/2013 9:03 AM

## 2013-05-06 ENCOUNTER — Ambulatory Visit (INDEPENDENT_AMBULATORY_CARE_PROVIDER_SITE_OTHER): Payer: BC Managed Care – PPO | Admitting: Internal Medicine

## 2013-05-06 ENCOUNTER — Encounter: Payer: Self-pay | Admitting: Internal Medicine

## 2013-05-06 VITALS — BP 117/77 | HR 86 | Temp 98.4°F | Resp 18 | Wt 144.0 lb

## 2013-05-06 DIAGNOSIS — R0602 Shortness of breath: Secondary | ICD-10-CM

## 2013-05-06 DIAGNOSIS — Z634 Disappearance and death of family member: Secondary | ICD-10-CM

## 2013-05-06 DIAGNOSIS — E785 Hyperlipidemia, unspecified: Secondary | ICD-10-CM

## 2013-05-06 NOTE — Progress Notes (Signed)
Subjective:    Patient ID: Brianna Ross, female    DOB: 15-Dec-1955, 58 y.o.   MRN: [id number]  HPI  Brianna Ross is here for follow up    Recent death of father days ago.  Brianna Ross  Found him expired in his chair.  She is off work this week  She had work cardiology assessment with Dr. Gwenlyn Found.   No further chest symptoms.  She did have CT angio in December neg mass no PE  See lipids  Discussed Framingham risk profile with pt  Score 3.5%    (infomred of limitation of lack of  FH in calculator :  No Known Allergies Past Medical History  Diagnosis Date  . Allergy   . Sleep apnea   . Rosacea   . Depression   . Shingles 03/2012  . ANA positive   . Hives 2011    x5  . Urticaria   . Headache(784.0)   . OSA (obstructive sleep apnea)   . Menopause   . Atypical nevi   . Shortness of breath   . Hyperlipidemia   . Abnormal stress electrocardiogram test    Past Surgical History  Procedure Laterality Date  . Thymic cyst      removal  . Wisdom tooth extraction     History   Social History  . Marital Status: Married    Spouse Name: N/A    Number of Children: 0  . Years of Education: 16   Occupational History  .     Social History Main Topics  . Smoking status: Never Smoker   . Smokeless tobacco: Never Used  . Alcohol Use: Yes     Comment: rarely  . Drug Use: No  . Sexual Activity: Yes    Birth Control/ Protection: Post-menopausal   Other Topics Concern  . Not on file   Social History Narrative   Patient works for [employer]. Patient lives at home with her husband Patent examiner).   Family History  Problem Relation Age of Onset  . Glaucoma Mother   . Hyperlipidemia Mother   . Depression Mother   . Osteoporosis Mother   . Heart disease Father   . Hyperlipidemia Father   . Hypertension Father   . Heart defect Sister   . Heart disease Sister 13    congenital heart defect,deceased  . Hyperlipidemia Brother   . Depression Brother   . High Cholesterol Brother   .  Cancer Paternal Grandmother   . Stroke Paternal Grandfather   . Heart disease Paternal Grandfather    Patient Active Problem List   Diagnosis Date Noted  . Hyperlipidemia 04/29/2013  . Shortness of breath 12/10/2012  . Family history of heart disease 12/10/2012  . Atrophic vaginitis 11/17/2012  . Herpes zoster 11/12/2012  . 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 Dyspnea 11/12/2012  . Herpes zoster dermatitis of eyelid 04/21/2012  . Positive ANA (antinuclear antibody) 03/06/2012  . Elevated aldolase level 03/06/2012  . Myalgia 02/22/2012  . Myalgia and myositis 02/21/2012  . Obstructive sleep apnea 02/17/2012  . Headache 11/14/2011  . Atypical nevi 11/14/2011  . Depression 11/13/2011  . Menopause 11/13/2011  . Rosacea 11/13/2011  . Urticaria 11/13/2011  . 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 acetaminophen (TYLENOL) 500 MG tablet Take 500 mg by mouth 2 (two) times daily as needed (pain).      Marland Kitchen aspirin EC 81 MG tablet Take 81  mg by mouth at bedtime.       . betamethasone valerate (VALISONE) 0.1 % cream Apply 1 application topically daily as needed (vaginal irritation).       Marland Kitchen desvenlafaxine (PRISTIQ) 50 MG 24 hr tablet Take 50 mg by mouth daily.      . dorzolamide-timolol (COSOPT) 22.3-6.8 MG/ML ophthalmic solution Place 1 drop into the left eye 2 (two) times daily.       . fluorometholone (FML) 0.1 % ophthalmic suspension Place 1 drop into the left eye daily.      . hydroxychloroquine (PLAQUENIL) 200 MG tablet Take 200 mg by mouth 2 (two) times daily.       . metroNIDAZOLE (METROGEL) 1 % gel Apply 1 application topically 3 (three) times a week. Use any 3 days weekly      . tacrolimus (PROTOPIC) 0.1 % ointment Apply 1 application topically 2 (two) times daily.      . valACYclovir (VALTREX) 1000 MG tablet Take 1,000 mg by mouth at bedtime.       . hydrOXYzine (ATARAX/VISTARIL) 25 MG tablet  Take 25 mg by mouth daily as needed for anxiety.        No current facility-administered medications on file prior to visit.      Review of Systems See HPI    Objective:   Physical Exam Physical Exam  Nursing note and vitals reviewed.  Constitutional: She is oriented to person, place, and time. She appears well-developed and well-nourished.  HENT:  Head: Normocephalic and atraumatic.  Cardiovascular: Normal rate and regular rhythm. Exam reveals no gallop and no friction rub.  No murmur heard.  Pulmonary/Chest: Breath sounds normal. She has no wheezes. She has no rales.  Neurological: She is alert and oriented to person, place, and time.  Skin: Skin is warm and dry.  Psychiatric: She has a normal mood and affect. Her behavior is normal.              Assessment & Plan:  Hyperlipidemia  Risk score 3.5%  Pt does not wish to start statin now.  Afraid of SE profile  Will recheck fasting levels in 6 months if trending upward will re-address and possible with statin 3 days per week  Bereavement:  Advised to call hospice grief counseling  SOB   Neg cardiology diagnostic work up this far  See me in 6 months

## 2013-05-13 ENCOUNTER — Ambulatory Visit: Payer: 59 | Admitting: Internal Medicine

## 2013-05-13 ENCOUNTER — Encounter (HOSPITAL_COMMUNITY): Payer: BC Managed Care – PPO

## 2013-05-27 ENCOUNTER — Telehealth (HOSPITAL_COMMUNITY): Payer: Self-pay | Admitting: *Deleted

## 2013-06-11 ENCOUNTER — Telehealth (HOSPITAL_COMMUNITY): Payer: Self-pay

## 2013-06-16 ENCOUNTER — Ambulatory Visit (HOSPITAL_COMMUNITY)
Admission: RE | Admit: 2013-06-16 | Discharge: 2013-06-16 | Disposition: A | Discharge status: Home / Self Care | Payer: BC Managed Care – PPO | Source: Ambulatory Visit | Attending: Cardiology | Admitting: Cardiology

## 2013-06-16 DIAGNOSIS — Z8249 Family history of ischemic heart disease and other diseases of the circulatory system: Secondary | ICD-10-CM | POA: Insufficient documentation

## 2013-06-16 DIAGNOSIS — R0602 Shortness of breath: Secondary | ICD-10-CM | POA: Insufficient documentation

## 2013-06-16 DIAGNOSIS — E785 Hyperlipidemia, unspecified: Secondary | ICD-10-CM

## 2013-06-16 DIAGNOSIS — J45909 Unspecified asthma, uncomplicated: Secondary | ICD-10-CM | POA: Insufficient documentation

## 2013-06-16 DIAGNOSIS — R0989 Other specified symptoms and signs involving the circulatory and respiratory systems: Principal | ICD-10-CM | POA: Insufficient documentation

## 2013-06-16 DIAGNOSIS — R9439 Abnormal result of other cardiovascular function study: Secondary | ICD-10-CM

## 2013-06-16 DIAGNOSIS — R0609 Other forms of dyspnea: Secondary | ICD-10-CM | POA: Insufficient documentation

## 2013-06-16 MED ORDER — TECHNETIUM TC 99M SESTAMIBI GENERIC - CARDIOLITE
30.0000 | Freq: Once | INTRAVENOUS | Status: AC | PRN
  Administered 2013-06-16: 30 via INTRAVENOUS

## 2013-06-16 MED ORDER — TECHNETIUM TC 99M SESTAMIBI GENERIC - CARDIOLITE
10.0000 | Freq: Once | INTRAVENOUS | Status: AC | PRN
  Administered 2013-06-16: 10 via INTRAVENOUS

## 2013-06-16 NOTE — Procedures (Addendum)
[facility] [address] [place] [address] [phone]  Cardiology Nuclear Med Study  Brianna Ross is a 58 y.o. female     MRN : [id number]     DOB: November 24, 1955  Procedure Date: 06/16/2013  Nuclear Med Background Indication for Stress Test:  Evaluation for Ischemia and Abnormal GXT History:  Asthma and No prior cardiac history reported;No Prior NUC MPI for comparison;ECHO-EF=55-60%. Cardiac Risk Factors: Family History - CAD and Lipids  Symptoms:  DOE and SOB   Nuclear Pre-Procedure Caffeine/Decaff Intake:  7:00pm NPO After: 5:00am   IV Site: R Forearm  IV 0.9% NS with Angio Cath:  22g  Chest Size (in):  n/a IV Started by: Rolene Course, RN  Height: 5\' 5"  (1.651 m)  Cup Size: B  BMI:  Body mass index is 24.13 kg/(m^2). Weight:  145 lb (65.772 kg)   Tech Comments:  n/a    Nuclear Med Study 1 or 2 day study: 1 day  Stress Test Type:  Stress  Order Authorizing Provider:  Quay Burow, MD   Resting Radionuclide: Technetium 31m Sestamibi  Resting Radionuclide Dose: 10.2 mCi   Stress Radionuclide:  Technetium 19m Sestamibi  Stress Radionuclide Dose: 31.2 mCi           Stress Protocol Rest HR: 78 Stress HR: 169  Rest BP: 121/96 Stress BP: 156/88  Exercise Time (min): 7:30 METS: 9.4   Predicted Max HR: 164 bpm % Max HR: 103.05 bpm Rate Pressure Product: [phone]  Dose of Adenosine (mg):  n/a Dose of Lexiscan: n/a mg  Dose of Atropine (mg): n/a Dose of Dobutamine: n/a mcg/kg/min (at max HR)  Stress Test Technologist: Leane Para, CCT Nuclear Technologist: Otho Perl, CNMT   Rest Procedure:  Myocardial perfusion imaging was performed at rest 45 minutes following the intravenous administration of Technetium 67m Sestamibi. Stress Procedure:  The patient performed treadmill exercise using a Bruce  Protocol for 7:30 minutes. The patient stopped due to SOB and denied any chest pain.  There were significant ST-T wave  changes.  Technetium 40m Sestamibi was injected IV at peak exercise and myocardial perfusion imaging was performed after a brief delay.  Transient Ischemic Dilatation (Normal <1.22):  1.08 QGS EDV:  66 ml QGS ESV:  20 ml LV Ejection Fraction: 69%        Rest ECG: NSR - Normal EKG  Stress ECG: Significant ST abnormalities consistent with ischemia.  QPS Raw Data Images:  Normal; no motion artifact; normal heart/lung ratio. Stress Images:  Normal homogeneous uptake in all areas of the myocardium. Rest Images:  Normal homogeneous uptake in all areas of the myocardium. Subtraction (SDS):  No evidence of ischemia.  Impression Exercise Capacity:  Good exercise capacity. BP Response:  Normal blood pressure response. Clinical Symptoms:  No significant symptoms noted. ECG Impression:  Significant ST abnormalities consistent with ischemia. Comparison with Prior Nuclear Study: No images to compare  Overall Impression:  Normal stress nuclear study. GXT minimally +  LV Wall Motion:  NL LV Function; NL Wall Motion   Lorretta Harp, MD  06/16/2013 3:04 PM

## 2013-06-17 ENCOUNTER — Encounter: Payer: Self-pay | Admitting: *Deleted

## 2013-07-01 NOTE — Telephone Encounter (Signed)
Encounter complete. 

## 2013-07-09 NOTE — Telephone Encounter (Signed)
Encounter complete. 

## 2013-07-29 ENCOUNTER — Ambulatory Visit: Payer: 59 | Admitting: Neurology

## 2013-08-06 ENCOUNTER — Encounter: Payer: Self-pay | Admitting: Neurology

## 2013-08-06 ENCOUNTER — Ambulatory Visit (INDEPENDENT_AMBULATORY_CARE_PROVIDER_SITE_OTHER): Payer: BC Managed Care – PPO | Admitting: Neurology

## 2013-08-06 VITALS — BP 121/80 | HR 85 | Ht 64.75 in | Wt 144.0 lb

## 2013-08-06 DIAGNOSIS — G4481 Hypnic headache: Secondary | ICD-10-CM | POA: Insufficient documentation

## 2013-08-06 DIAGNOSIS — G4733 Obstructive sleep apnea (adult) (pediatric): Secondary | ICD-10-CM | POA: Insufficient documentation

## 2013-08-06 NOTE — Patient Instructions (Signed)

## 2013-08-06 NOTE — Progress Notes (Signed)
SLEEP MEDICINE CLINIC   Provider:  Larey Seat, M D  Referring Provider: Lanice Shirts, * Primary Care Physician:  Kelton Pillar, MD  Chief Complaint  Patient presents with  . Follow-up    Room 11  . Sleep Apnea    HPI:  Brianna Ross is a 58 y.o. female , who  is seen here as a revisit  from Dr. Coralyn Mark for CPAP compliance.    I first encountered Brianna Ross after she endorsed excessive daytime sleepiness in the setting of some depression for over 30 years. She had initially and Brianna Ross sleepiness score at 15 points, a very high reading she underwent a polysomnography and then return for a titration study on 3-to-14 the patient had insomnia but also became evident that she needed to be AP therapy AHI was 57.9 per hour of sleep and a very high degree of sleep apnea. The patient then started on BiPAP with 13 cm water over  8 cm water. During the titration study she was able to sleep 67 minutes of which 10 minutes were rapid eye movement sleep. Her last download from 06-19-12 shoulder user time of 6 hours and 48 minutes.   Today, she appears much refreshed and endorsed the fatigue severity scale at 12 points only and the Ross sleepiness score at 4 points down from 15.   Brianna Ross's VPAP shows a residual AHI of 8.3, a respiratory rate of 14 per minute tidal volume of 9 20 mm . inspiratory pressure of  13  centimeter water , expiratory pressure 8 cm water . The  time of use of BiPAP/ VPAP is 6 hours and 51 minutes , with  94% compliance.   Her sleep habits are as follows:  She goes to bed at 11 Pm , falls asleep promptly and sleeps through until 6 AM . She has an alarm clock set .  She drinks 1 cup of coffee in the morning, and 2 cups of tea in daytime.  She fees well and has been "loving " CPAP sleep, but the mask took some getting used to.    Review of Systems: Out of a complete 14 system review, the patient complains of only the following symptoms, and  all other reviewed systems are negative.  The patient compliance has exceeded 88% for CPAP over the last 15 months.  Morning headaches have completely resolved sleepiness is much improved attention is improved. She has nor nocturia and on CPAP.   Ross score 4 , Fatigue severity score 12   , depression score 3    History   Social History  . Marital Status: Married    Spouse Name: Brianna Ross    Number of Children: 0  . Years of Education: 16   Occupational History  .     Social History Main Topics  . Smoking status: Never Smoker   . Smokeless tobacco: Never Used  . Alcohol Use: Yes     Comment: rarely  . Drug Use: No  . Sexual Activity: Yes    Birth Control/ Protection: Post-menopausal   Other Topics Concern  . Not on file   Social History Narrative   Patient works for [employer].(full-time_    Patient lives at home with her husband Brianna Ross).   Patient does not have any children.   Patient is right-handed.   Patient drinks one cup of coffee everyday and two cups of tea daily.    Family History  Problem Relation Age of Onset  .  Glaucoma Mother   . Hyperlipidemia Mother   . Depression Mother   . Osteoporosis Mother   . Heart disease Father   . Hyperlipidemia Father   . Hypertension Father   . Heart defect Sister   . Heart disease Sister 13    congenital heart defect,deceased  . Hyperlipidemia Brother   . Depression Brother   . High Cholesterol Brother   . Cancer Paternal Grandmother   . Stroke Paternal Grandfather   . Heart disease Paternal Grandfather     Past Medical History  Diagnosis Date  . Allergy   . Sleep apnea   . Rosacea   . Depression   . Shingles 03/2012  . ANA positive   . Hives 2011    x5  . Urticaria   . Headache(784.0)   . OSA (obstructive sleep apnea)   . Menopause   . Atypical nevi   . Shortness of breath   . Hyperlipidemia   . Abnormal stress electrocardiogram test     Past Surgical History  Procedure  Laterality Date  . Thymic cyst      removal  . Wisdom tooth extraction      Current Outpatient Prescriptions  Medication Sig Dispense Refill  . acetaminophen (TYLENOL) 500 MG tablet Take 500 mg by mouth 2 (two) times daily as needed (pain).      Marland Kitchen aspirin EC 81 MG tablet Take 81 mg by mouth at bedtime.       . cholecalciferol (VITAMIN D) 1000 UNITS tablet Take 1,000 Units by mouth. Taking three times a week      . desvenlafaxine (PRISTIQ) 50 MG 24 hr tablet Take 50 mg by mouth daily.      . dorzolamide-timolol (COSOPT) 22.3-6.8 MG/ML ophthalmic solution Place 1 drop into the left eye 2 (two) times daily.       . hydroxychloroquine (PLAQUENIL) 200 MG tablet Take 200 mg by mouth 2 (two) times daily.       . hydrOXYzine (ATARAX/VISTARIL) 25 MG tablet Take 25 mg by mouth daily as needed for anxiety.       . metroNIDAZOLE (METROGEL) 1 % gel Apply 1 application topically 3 (three) times a week. Use any 3 days weekly      . Multiple Vitamins-Minerals (MULTIVITAMIN ADULTS 50+ PO) Take 1 tablet by mouth. Three times a week      . tacrolimus (PROTOPIC) 0.1 % ointment Apply 1 application topically 2 (two) times daily.      . valACYclovir (VALTREX) 1000 MG tablet Take 1,000 mg by mouth at bedtime.        No current facility-administered medications for this visit.    Allergies as of 08/06/2013  . (No Known Allergies)    Vitals: BP 121/80  Pulse 85  Ht 5' 4.75" (1.645 m)  Wt 144 lb (65.318 kg)  BMI 24.14 kg/m2 Last Weight:  Wt Readings from Last 1 Encounters:  08/06/13 144 lb (65.318 kg)       Last Height:   Ht Readings from Last 1 Encounters:  08/06/13 5' 4.75" (1.645 m)    Physical exam:  General: The patient is awake, alert and appears not in acute distress. The patient is well groomed. Head: Normocephalic, atraumatic. Neck is supple. Mallampati 2 ,  neck circumference:14  Nasal airflow unrestricted, TMJ is evident. Retrognathia is not seen.  Cardiovascular:  Regular rate and  rhythm , without  murmurs or carotid bruit, and without distended neck veins. Respiratory: Lungs are clear to auscultation. Skin:  Without evidence of edema, or rash Trunk: BMI is  elevated and patient  has normal posture.  Neurologic exam : The patient is awake and alert, oriented to place and time.   Memory subjective  described as intact.  There is a normal attention span & concentration ability. Speech is fluent without dysarthria, dysphonia or aphasia. Mood and affect are appropriate.  Cranial nerves: Pupils are equal and briskly reactive to light. Funduscopic exam without  evidence of pallor or edema.  Extraocular movements  in vertical and horizontal planes intact and without nystagmus. Visual fields by finger perimetry are intact. Hearing to finger rub intact.  Facial sensation intact to fine touch. Facial motor strength is symmetric and tongue and uvula move midline. Motor exam:   Normal tone ,muscle bulk and symmetric ,strength in all extremities. Sensory:  Fine touch, pinprick and vibration were tested in all extremities. Proprioception is normal. Coordination: Rapid alternating movements in the fingers/hands is normal.  Finger-to-nose maneuver normal without evidence of ataxia, dysmetria or tremor.  Gait and station: Patient walks without assistive device . Deep tendon reflexes: in the  upper and lower extremities are symmetric and intact. Babinski maneuver response is  downgoing.   Assessment:  After physical and neurologic examination, review of laboratory studies, imaging, neurophysiology testing and pre-existing records, assessment is  1)OSA, clinically well responding to CPAP. 2) Migraine and hypnic headaches  clinically well responding to CPAP.  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 clinically well responding to CPAP.   The patient was advised of the nature of the diagnosed sleep disorder , the treatment options and risks for general a health and wellness  arising from not treating the condition. Visit duration was 25 minutes.   Plan:  Treatment plan and additional workup : continue CPAP use, compliance established , RV in 12 month     Asencion Partridge Obrian Bulson MD  08/06/2013

## 2013-11-10 ENCOUNTER — Other Ambulatory Visit: Payer: Self-pay | Admitting: *Deleted

## 2013-11-10 ENCOUNTER — Telehealth: Payer: Self-pay

## 2013-11-10 DIAGNOSIS — Z0000 Encounter for general adult medical examination without abnormal findings: Secondary | ICD-10-CM

## 2013-11-10 NOTE — Telephone Encounter (Signed)
Printed orders for CPE labs -eh

## 2013-11-10 NOTE — Telephone Encounter (Signed)
Brianna Ross would like to come in this Thursday or Friday early to get her CPE labs, so results will be back in for her CPE visit next week. I told her to just come by here and pick up papers to take to lab.

## 2013-11-13 LAB — CBC WITH DIFFERENTIAL/PLATELET
BASOS ABS: 0 10*3/uL (ref 0.0–0.1)
BASOS PCT: 0 % (ref 0–1)
EOS PCT: 7 % — AB (ref 0–5)
Eosinophils Absolute: 0.3 10*3/uL (ref 0.0–0.7)
HEMATOCRIT: 41.6 % (ref 36.0–46.0)
HEMOGLOBIN: 14.1 g/dL (ref 12.0–15.0)
Lymphocytes Relative: 30 % (ref 12–46)
Lymphs Abs: 1.3 10*3/uL (ref 0.7–4.0)
MCH: 30.9 pg (ref 26.0–34.0)
MCHC: 33.9 g/dL (ref 30.0–36.0)
MCV: 91.2 fL (ref 78.0–100.0)
MONOS PCT: 11 % (ref 3–12)
Monocytes Absolute: 0.5 10*3/uL (ref 0.1–1.0)
Neutro Abs: 2.2 10*3/uL (ref 1.7–7.7)
Neutrophils Relative %: 52 % (ref 43–77)
Platelets: 239 10*3/uL (ref 150–400)
RBC: 4.56 MIL/uL (ref 3.87–5.11)
RDW: 12.9 % (ref 11.5–15.5)
WBC: 4.3 10*3/uL (ref 4.0–10.5)

## 2013-11-13 LAB — COMPLETE METABOLIC PANEL WITH GFR
ALK PHOS: 49 U/L (ref 39–117)
ALT: 24 U/L (ref 0–35)
AST: 18 U/L (ref 0–37)
Albumin: 4.3 g/dL (ref 3.5–5.2)
BUN: 11 mg/dL (ref 6–23)
CALCIUM: 9.6 mg/dL (ref 8.4–10.5)
CO2: 25 mEq/L (ref 19–32)
Chloride: 104 mEq/L (ref 96–112)
Creat: 0.75 mg/dL (ref 0.50–1.10)
GFR, EST NON AFRICAN AMERICAN: 89 mL/min
GFR, Est African American: >89 mL/min
Glucose, Bld: 81 mg/dL (ref 70–99)
Potassium: 4 mEq/L (ref 3.5–5.3)
Sodium: 138 mEq/L (ref 135–145)
Total Bilirubin: 0.5 mg/dL (ref 0.2–1.2)
Total Protein: 6.4 g/dL (ref 6.0–8.3)

## 2013-11-13 LAB — LIPID PANEL
CHOL/HDL RATIO: 2.8 ratio
CHOLESTEROL: 188 mg/dL (ref 0–200)
HDL: 68 mg/dL (ref >39)
LDL Cholesterol: 104 mg/dL — ABNORMAL HIGH (ref 0–99)
Triglycerides: 82 mg/dL (ref <150)
VLDL: 16 mg/dL (ref 0–40)

## 2013-11-13 LAB — TSH: TSH: 1.684 u[IU]/mL (ref 0.350–4.500)

## 2013-11-13 LAB — HM MAMMOGRAPHY

## 2013-11-14 LAB — VITAMIN D 25 HYDROXY (VIT D DEFICIENCY, FRACTURES): VIT D 25 HYDROXY: 42 ng/mL (ref 30–89)

## 2013-11-18 ENCOUNTER — Encounter: Payer: Self-pay | Admitting: Internal Medicine

## 2013-11-18 ENCOUNTER — Ambulatory Visit (INDEPENDENT_AMBULATORY_CARE_PROVIDER_SITE_OTHER): Payer: BC Managed Care – PPO | Admitting: Internal Medicine

## 2013-11-18 VITALS — BP 122/74 | HR 81 | Temp 97.9°F | Resp 16 | Ht 65.0 in | Wt 143.0 lb

## 2013-11-18 DIAGNOSIS — Z0001 Encounter for general adult medical examination with abnormal findings: Secondary | ICD-10-CM

## 2013-11-18 DIAGNOSIS — G4733 Obstructive sleep apnea (adult) (pediatric): Secondary | ICD-10-CM

## 2013-11-18 DIAGNOSIS — Z0000 Encounter for general adult medical examination without abnormal findings: Secondary | ICD-10-CM

## 2013-11-18 DIAGNOSIS — R768 Other specified abnormal immunological findings in serum: Secondary | ICD-10-CM

## 2013-11-18 DIAGNOSIS — E785 Hyperlipidemia, unspecified: Secondary | ICD-10-CM | POA: Diagnosis not present

## 2013-11-18 DIAGNOSIS — B0233 Zoster keratitis: Secondary | ICD-10-CM | POA: Diagnosis not present

## 2013-11-18 LAB — POCT URINALYSIS DIPSTICK
BILIRUBIN UA: NEGATIVE
Blood, UA: NEGATIVE
GLUCOSE UA: NEGATIVE
Ketones, UA: NEGATIVE
LEUKOCYTES UA: NEGATIVE
NITRITE UA: NEGATIVE
PH UA: 7.5
Protein, UA: NEGATIVE
Spec Grav, UA: 1.01
Urobilinogen, UA: NEGATIVE

## 2013-11-18 LAB — HEMOCCULT GUIAC POC 1CARD (OFFICE): Fecal Occult Blood, POC: NEGATIVE

## 2013-11-18 NOTE — Progress Notes (Signed)
Subjective:    Patient ID: Brianna Ross, female    DOB: 03/15/1955, 58 y.o.   MRN: [id number]  HPI 8/06 neurology note Assessment: After physical and neurologic examination, review of laboratory studies, imaging, neurophysiology testing and pre-existing records, assessment is  1)OSA, clinically well responding to CPAP. 2) Migraine and hypnic headaches clinically well responding to CPAP.  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 clinically well responding to CPAP.   The patient was advised of the nature of the diagnosed sleep disorder , the treatment options and risks for general a health and wellness arising from not treating the condition. Visit duration was 25 minutes.   Plan: Treatment plan and additional workup : continue CPAP use, compliance established , RV in 12 month    Today   Brianna Ross is here for CPE  HM: mm colonoscopy vaccines UTD  She is a non-smokier  Opthalmic Zoster  She will be meeting new MD at [facility] who willl determine if she needs life-long supressive therapy     No Known Allergies Past Medical History  Diagnosis Date  . Allergy   . Sleep apnea   . Rosacea   . Depression   . Shingles 03/2012  . ANA positive   . Hives 2011    x5  . Urticaria   . Headache(784.0)   . OSA (obstructive sleep apnea)   . Menopause   . Atypical nevi   . Shortness of breath   . Hyperlipidemia   . Abnormal stress electrocardiogram test    Past Surgical History  Procedure Laterality Date  . Thymic cyst      removal  . Wisdom tooth extraction     History   Social History  . Marital Status: Married    Spouse Name: Brianna Ross    Number of Children: 0  . Years of Education: 16   Occupational History  .     Social History Main Topics  . Smoking status: Never Smoker   . Smokeless tobacco: Never Used  . Alcohol Use: Yes     Comment: rarely  . Drug Use: No  . Sexual Activity: Yes    Birth Control/ Protection: Post-menopausal   Other Topics Concern  . Not on  file   Social History Narrative   Patient works for [employer].(full-time_    Patient lives at home with her husband Brianna Ross).   Patient does not have any children.   Patient is right-handed.   Patient drinks one cup of coffee everyday and two cups of tea daily.   Family History  Problem Relation Age of Onset  . Glaucoma Mother   . Hyperlipidemia Mother   . Depression Mother   . Osteoporosis Mother   . Heart disease Father   . Hyperlipidemia Father   . Hypertension Father   . Heart defect Sister   . Heart disease Sister 13    congenital heart defect,deceased  . Hyperlipidemia Brother   . Depression Brother   . High Cholesterol Brother   . Cancer Paternal Grandmother   . Stroke Paternal Grandfather   . Heart disease Paternal Grandfather    Patient Active Problem List   Diagnosis Date Noted  . Hypnic headache 08/06/2013  . OSA treated with BiPAP 08/06/2013  . Hyperlipidemia 04/29/2013  . Shortness of breath 12/10/2012  . Family history of heart disease 12/10/2012  . Atrophic vaginitis 11/17/2012  . Herpes zoster 11/12/2012  . 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 Dyspnea 11/12/2012  .  Herpes zoster dermatitis of eyelid 04/21/2012  . Positive ANA (antinuclear antibody) 03/06/2012  . Elevated aldolase level 03/06/2012  . Myalgia 02/22/2012  . Myalgia and myositis 02/21/2012  . Obstructive sleep apnea 02/17/2012  . Headache 11/14/2011  . Atypical nevi 11/14/2011  . Depression 11/13/2011  . Menopause 11/13/2011  . Rosacea 11/13/2011  . Urticaria 11/13/2011  . 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 acetaminophen (TYLENOL) 500 MG tablet Take 500 mg by mouth 2 (two) times daily as needed (pain).    Marland Kitchen aspirin EC 81 MG tablet Take 81 mg by mouth at bedtime.     . cholecalciferol (VITAMIN D) 1000 UNITS tablet Take 1,000 Units by mouth. Taking three times a week    .  desvenlafaxine (PRISTIQ) 50 MG 24 hr tablet Take 50 mg by mouth daily.    . dorzolamide-timolol (COSOPT) 22.3-6.8 MG/ML ophthalmic solution Place 1 drop into the left eye 2 (two) times daily.     . hydroxychloroquine (PLAQUENIL) 200 MG tablet Take 200 mg by mouth 2 (two) times daily.     . hydrOXYzine (ATARAX/VISTARIL) 25 MG tablet Take 25 mg by mouth daily as needed for anxiety.     . metroNIDAZOLE (METROGEL) 1 % gel Apply 1 application topically 3 (three) times a week. Use any 3 days weekly    . Multiple Vitamins-Minerals (MULTIVITAMIN ADULTS 50+ PO) Take 1 tablet by mouth. Three times a week    . tacrolimus (PROTOPIC) 0.1 % ointment Apply 1 application topically 2 (two) times daily.    . valACYclovir (VALTREX) 1000 MG tablet Take 1,000 mg by mouth at bedtime.      No current facility-administered medications on file prior to visit.       Review of Systems  Respiratory: Negative for cough, chest tightness, shortness of breath and wheezing.   Cardiovascular: Negative for chest pain, palpitations and leg swelling.  All other systems reviewed and are negative.      Objective:   Physical Exam  Physical Exam  Nursing note and vitals reviewed.  Constitutional: She is oriented to person, place, and time. She appears well-developed and well-nourished.  HENT:  Head: Normocephalic and atraumatic.  Right Ear: Tympanic membrane and ear canal normal. No drainage. Tympanic membrane is not injected and not erythematous.  Left Ear: Tympanic membrane and ear canal normal. No drainage. Tympanic membrane is not injected and not erythematous.  Nose: Nose normal. Right sinus exhibits no maxillary sinus tenderness and no frontal sinus tenderness. Left sinus exhibits no maxillary sinus tenderness and no frontal sinus tenderness.  Mouth/Throat: Oropharynx is clear and moist. No oral lesions. No oropharyngeal exudate.  Eyes: Conjunctivae and EOM are normal. Pupils are equal, round, and reactive to light.    Neck: Normal range of motion. Neck supple. No JVD present. Carotid bruit is not present. No mass and no thyromegaly present.  Cardiovascular: Normal rate, regular rhythm, S1 normal, S2 normal and intact distal pulses. Exam reveals no gallop and no friction rub.  No murmur heard.  Pulses:  Carotid pulses are 2+ on the right side, and 2+ on the left side.  Dorsalis pedis pulses are 2+ on the right side, and 2+ on the left side.  No carotid bruit. No LE edema  Pulmonary/Chest: Breath sounds normal. She has no wheezes. She has no rales. She exhibits no tenderness.  Abdominal: Soft. Bowel sounds are normal. She exhibits no distension and no mass.  There is no hepatosplenomegaly. There is no tenderness. There is no CVA tenderness.  Musculoskeletal: Normal range of motion.  No active synovitis to joints.  Lymphadenopathy:  She has no cervical adenopathy.  She has no axillary adenopathy.  Right: No inguinal and no supraclavicular adenopathy present.  Left: No inguinal and no supraclavicular adenopathy present.  Neurological: She is alert and oriented to person, place, and time. She has normal strength and normal reflexes. She displays no tremor. No cranial nerve deficit or sensory deficit. Coordination and gait normal.  Skin: Skin is warm and dry. No rash noted. No cyanosis. Nails show no clubbing.  Psychiatric: She has a normal mood and affect. Her speech is normal and behavior is normal. Cognition and memory are normal.          Assessment & Plan:  HM:  Mm and colonoscopy done.  .    See scanned sheet   Counseled lung Ca screening  She is a non-smoker  Hyperlipdemia  Great results  Opthalmic Zoster  [facility] at [facility].  Reghan is on Valtrex now and will be evaluated for lifelong suprresive therapy.  Advised no Zostavax  OSA  On CPAP tolerating well.    Depression continue Pristiq  See me as needed

## 2013-11-19 ENCOUNTER — Encounter: Payer: Self-pay | Admitting: *Deleted

## 2014-02-15 ENCOUNTER — Ambulatory Visit: Payer: Self-pay | Admitting: Internal Medicine

## 2014-04-26 ENCOUNTER — Encounter: Payer: Self-pay | Admitting: *Deleted

## 2014-05-13 ENCOUNTER — Encounter: Payer: Self-pay | Admitting: Internal Medicine

## 2014-05-13 ENCOUNTER — Ambulatory Visit (INDEPENDENT_AMBULATORY_CARE_PROVIDER_SITE_OTHER): Payer: BLUE CROSS/BLUE SHIELD | Admitting: Internal Medicine

## 2014-05-13 VITALS — BP 104/75 | HR 93 | Resp 16 | Ht 64.5 in | Wt 129.0 lb

## 2014-05-13 DIAGNOSIS — F329 Major depressive disorder, single episode, unspecified: Secondary | ICD-10-CM | POA: Diagnosis not present

## 2014-05-13 DIAGNOSIS — N63 Unspecified lump in breast: Secondary | ICD-10-CM

## 2014-05-13 DIAGNOSIS — N6310 Unspecified lump in the right breast, unspecified quadrant: Secondary | ICD-10-CM

## 2014-05-13 DIAGNOSIS — R634 Abnormal weight loss: Secondary | ICD-10-CM

## 2014-05-13 DIAGNOSIS — F32A Depression, unspecified: Secondary | ICD-10-CM

## 2014-05-13 NOTE — Progress Notes (Addendum)
Subjective:    Patient ID: Edmund Hilda, female    DOB: June 03, 1955, 59 y.o.   MRN: [id number]  HPI  11/2013 note Assessment & Plan:  HM: Mm and colonoscopy done. . See scanned sheet Counseled lung Ca screening She is a non-smoker  Hyperlipdemia Great results  Opthalmic Zoster manaage at [facility]. Ruben is on Valtrex now and will be evaluated for lifelong suppresive therapy. Advised no Zostavax  OSA On CPAP tolerating well.   Depression continue Pristiq       TODAY  Vondell come with an acute concern.  She has felt a mass in her Right breast found one week ago.  Of note Sinclair is very teary today as she says that she has worsening depression and has recently had to place her mother in assisted living .  She is seeing her psychiatrist who is adjusting medication  No Known Allergies Past Medical History  Diagnosis Date  . Allergy   . Sleep apnea   . Rosacea   . Depression   . Shingles 03/2012  . ANA positive   . Hives 2011    x5  . Urticaria   . Headache(784.0)   . OSA (obstructive sleep apnea)   . Menopause   . Atypical nevi   . Shortness of breath   . Hyperlipidemia   . Abnormal stress electrocardiogram test    Past Surgical History  Procedure Laterality Date  . Thymic cyst      removal  . Wisdom tooth extraction     History   Social History  . Marital Status: Married    Spouse Name: [name]  . Number of Children: 0  . Years of Education: 16   Occupational History  .     Social History Main Topics  . Smoking status: Never Smoker   . Smokeless tobacco: Never Used  . Alcohol Use: Yes     Comment: rarely  . Drug Use: No  . Sexual Activity: Yes    Birth Control/ Protection: Post-menopausal   Other Topics Concern  . Not on file   Social History Narrative   Patient works for [employer].(full-time_    Patient lives at home with her husband [name]).   Patient does not have any children.   Patient is right-handed.   Patient  drinks one cup of coffee everyday and two cups of tea daily.   Family History  Problem Relation Age of Onset  . Glaucoma Mother   . Hyperlipidemia Mother   . Depression Mother   . Osteoporosis Mother   . Heart disease Father   . Hyperlipidemia Father   . Hypertension Father   . Heart defect Sister   . Heart disease Sister 13    congenital heart defect,deceased  . Hyperlipidemia Brother   . Depression Brother   . High Cholesterol Brother   . Cancer Paternal Grandmother   . Stroke Paternal Grandfather   . Heart disease Paternal Grandfather    Patient Active Problem List   Diagnosis Date Noted  . Hypnic headache 08/06/2013  . OSA treated with BiPAP 08/06/2013  . Hyperlipidemia 04/29/2013  . Shortness of breath 12/10/2012  . Family history of heart disease 12/10/2012  . Atrophic vaginitis 11/17/2012  . Herpes zoster 11/12/2012  . 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 Dyspnea 11/12/2012  . Herpes zoster dermatitis of eyelid 04/21/2012  . Positive ANA (antinuclear antibody) 03/06/2012  . Elevated aldolase level 03/06/2012  . Myalgia 02/22/2012  . Myalgia and myositis 02/21/2012  .  Obstructive sleep apnea 02/17/2012  . Headache(784.0) 11/14/2011  . Atypical nevi 11/14/2011  . Depression 11/13/2011  . Menopause 11/13/2011  . Rosacea 11/13/2011  . Urticaria 11/13/2011  . 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 acetaminophen (TYLENOL) 500 MG tablet Take 500 mg by mouth 2 (two) times daily as needed (pain).    Marland Kitchen aspirin EC 81 MG tablet Take 81 mg by mouth at bedtime.     . cholecalciferol (VITAMIN D) 1000 UNITS tablet Take 1,000 Units by mouth. Taking three times a week    . desvenlafaxine (PRISTIQ) 50 MG 24 hr tablet Take 50 mg by mouth daily.    . dorzolamide-timolol (COSOPT) 22.3-6.8 MG/ML ophthalmic solution Place 1 drop into the left eye 2 (two) times daily.     . hydroxychloroquine  (PLAQUENIL) 200 MG tablet Take 200 mg by mouth 2 (two) times daily.     . hydrOXYzine (ATARAX/VISTARIL) 25 MG tablet Take 25 mg by mouth daily as needed for anxiety.     . metroNIDAZOLE (METROGEL) 1 % gel Apply 1 application topically 3 (three) times a week. Use any 3 days weekly    . Multiple Vitamins-Minerals (MULTIVITAMIN ADULTS 50+ PO) Take 1 tablet by mouth. Three times a week    . tacrolimus (PROTOPIC) 0.1 % ointment Apply 1 application topically 2 (two) times daily.    . valACYclovir (VALTREX) 1000 MG tablet Take 1,000 mg by mouth at bedtime.      No current facility-administered medications on file prior to visit.      Review of Systems    see HPI Objective:   Physical Exam Physical Exam  Nursing note and vitals reviewed.  Constitutional: She is oriented to person, place, and time. 14 lb weight loss since 11/2013  HENT:  Head: Normocephalic and atraumatic.  Cardiovascular: Normal rate and regular rhythm. Exam reveals no gallop and no friction rub.  No murmur heard.  Pulmonary/Chest: Breath sounds normal. She has no wheezes. She has no rales. Breast Careful exam of both breasts reveals easily mobile mass near right nipple 3 o'clock  No nipple discharge no axillary adenopathy .  Left breast no discrete mass no nipple discharge no axillary adenopathy Neurological: She is alert and oriented to person, place, and time.  Skin: Skin is warm and dry.  Psychiatric: She has a normal mood and affect. Her behavior is normal.       Assessment & Plan:  Right Breast mass   Will set up diagnostic mm further management based on results  Weight loss possibly  due to worsening depression.  Ct angio 2014 neg for mass or lymphadenopathy.   Depression managed by Psychiatry   Addendum 05/17/2014:  Received Diagnostic mm report from [place] [facility]:  Targeted ultrasound performed of right breast shows no areas of concern. See scanned report   I spoke with pt and reviewed findings .  I  advised pt to have repeat breast exam in 6-8 weeks and that it is very important to establish with a new PCP.  She would like to go to [facility] office.  I will leave message with Rachel Bo who is scheduling new pts. and have him call [facility] for a new pt appt.  I also advised pt to continue to moniter her weight and if further weight loss to discuss this with her new PCP.   Pt is convinced weight loss is due to worsening depression - she  is having her depression meds adjusted by her psychiatrist.  I counseled pt that while certainly possible that weight loss may be due to depression , if weight loss continues further assessment and work up will be necessary.  She voices understanding   5/19  Will also set up referral to breast clinic.  Spoke with pt today and she agrees

## 2014-05-18 ENCOUNTER — Encounter: Payer: Self-pay | Admitting: *Deleted

## 2014-05-18 ENCOUNTER — Other Ambulatory Visit: Payer: Self-pay | Admitting: Orthopaedic Surgery

## 2014-05-18 DIAGNOSIS — M25531 Pain in right wrist: Secondary | ICD-10-CM

## 2014-05-19 ENCOUNTER — Ambulatory Visit
Admission: RE | Admit: 2014-05-19 | Discharge: 2014-05-19 | Disposition: A | Discharge status: Home / Self Care | Payer: BLUE CROSS/BLUE SHIELD | Source: Ambulatory Visit | Attending: Orthopaedic Surgery | Admitting: Orthopaedic Surgery

## 2014-05-19 DIAGNOSIS — M25531 Pain in right wrist: Secondary | ICD-10-CM

## 2014-05-20 ENCOUNTER — Telehealth: Payer: Self-pay | Admitting: Internal Medicine

## 2014-05-20 DIAGNOSIS — N6310 Unspecified lump in the right breast, unspecified quadrant: Secondary | ICD-10-CM

## 2014-05-21 ENCOUNTER — Other Ambulatory Visit: Payer: BLUE CROSS/BLUE SHIELD

## 2014-05-21 NOTE — Telephone Encounter (Signed)
Called pt to adv appt with Dr Barry Dienes 06/11/14 at 10:30am arrive at 10:00. Pt expressed understanding.

## 2014-06-08 ENCOUNTER — Ambulatory Visit: Payer: BLUE CROSS/BLUE SHIELD | Attending: Orthopaedic Surgery | Admitting: Physical Therapy

## 2014-06-08 DIAGNOSIS — M6281 Muscle weakness (generalized): Secondary | ICD-10-CM | POA: Diagnosis not present

## 2014-06-08 DIAGNOSIS — M25521 Pain in right elbow: Secondary | ICD-10-CM | POA: Insufficient documentation

## 2014-06-08 DIAGNOSIS — M25631 Stiffness of right wrist, not elsewhere classified: Secondary | ICD-10-CM | POA: Diagnosis not present

## 2014-06-08 NOTE — Therapy (Signed)
[id number] [address] [place], [place], [address] Phone: [phone]   Fax:  [phone]  Physical Therapy Evaluation  Patient Details  Name: Brianna Ross MRN: [id number] Date of Birth: 1955/12/15 Referring Provider:  Garald Balding, MD  Encounter Date: 06/08/2014      PT End of Session - 06/08/14 0933    Visit Number 1   Date for PT Re-Evaluation 08/03/14   PT Start Time 0933   PT Stop Time 1012   PT Time Calculation (min) 39 min   Activity Tolerance Patient tolerated treatment well   Behavior During Therapy [facility] for tasks assessed/performed      Past Medical History  Diagnosis Date  . Allergy   . Sleep apnea   . Rosacea   . Depression   . Shingles 03/2012  . ANA positive   . Hives 2011    x5  . Urticaria   . Headache(784.0)   . OSA (obstructive sleep apnea)   . Menopause   . Atypical nevi   . Shortness of breath   . Hyperlipidemia   . Abnormal stress electrocardiogram test     Past Surgical History  Procedure Laterality Date  . Thymic cyst      removal  . Wisdom tooth extraction      There were no vitals filed for this visit.  Visit Diagnosis:  Stiffness of wrist joint, right - Plan: PT plan of care cert/re-cert  Generalized muscle weakness - Plan: PT plan of care cert/re-cert  Pain, elbow joint, right - Plan: PT plan of care cert/re-cert      Subjective Assessment - 06/08/14 0937    Subjective Patient had surgery on 05/25/14 for Rt wrist fx. Screw had to be removed on 05/27/14. Stitches removed yesterday. Presently wears removeable brace that she only has to wear if she's outside and at night.   Diagnostic tests xrays   Patient Stated Goals regain normal function of RUE   Currently in Pain? No/denies  tenderness to touch at medial mallelus intermittently.            [facility] PT Assessment - 06/08/14 0001    Assessment   Medical Diagnosis ORIF Rt distal radius fx   Onset  Date/Surgical Date 05/25/14   Hand Dominance Right   Prior Therapy n   Precautions   Precautions None   Required Braces or Orthoses Other Brace/Splint   Other Brace/Splint wrist;    Balance Screen   Has the patient fallen in the past 6 months Yes  y   How many times? 1   Has the patient had a decrease in activity level because of a fear of falling?  No   Is the patient reluctant to leave their home because of a fear of falling?  No   Home Ecologist residence   Living Arrangements Spouse/significant other   Prior Function   Level of Independence Independent with basic ADLs   Vocation Retired   Leisure helps mother with dementia   Observation/Other Assessments   Skin Integrity bruising Rt medial elbow   Focus on Therapeutic Outcomes (FOTO)  73% limited   ROM / Strength   AROM / PROM / Strength AROM;Strength   AROM   Overall AROM Comments bil shoulder/elbow WNL   AROM Assessment Site Forearm;Wrist   Right/Left Forearm Right;Left   Right Forearm Supination 16 Degrees   Left Forearm Supination 72 Degrees   Right/Left Wrist Right  Right Wrist Extension 33 Degrees  51   Right Wrist Flexion 27 Degrees  38   Right Wrist Radial Deviation 10 Degrees  14   Right Wrist Ulnar Deviation 30 Degrees  32   Left Wrist Extension 76 Degrees   Left Wrist Flexion 74 Degrees   Left Wrist Radial Deviation 19 Degrees   Left Wrist Ulnar Deviation 42 Degrees   Strength   Overall Strength Comments Rt wrist grossly 4/5   Strength Assessment Site Wrist;Hand   Right/Left Wrist Right;Left   Right/Left hand Right;Left   Right Hand Grip (lbs) 21/30/32   Left Hand Grip (lbs) 65/60/64   Palpation   Palpation comment marked tenderness at Rt med epicondyle; else unremarkable                             PT Short Term Goals - 06/08/14 1022    PT SHORT TERM GOAL #1   Title I with initial HEP   Time 2   Period Weeks   Status New   PT SHORT TERM  GOAL #2   Title improved wrist flexion/ext to 50 and 55 deg respectively   Time 4   Period Weeks   Status New   PT SHORT TERM GOAL #3   Title improved radial/ulnar deviation to 15 and 36 degrees respectively   Time 4   Status New   PT SHORT TERM GOAL #4   Title improved grip strength by 20 # on right   Time 4   Period Weeks   Status New   PT SHORT TERM GOAL #5   Title decreased pain in Rt med epicondyle by 75% or more   Time 4   Period Weeks   Status New           PT Long Term Goals - 06/08/14 1029    PT LONG TERM GOAL #1   Title I with advanced HEP   Time 8   Period Weeks   Status New   PT LONG TERM GOAL #2   Title improved wrist flex/ext to 65 deg to improve function.   Time 8   Period Weeks   Status New   PT LONG TERM GOAL #3   Title improved radial and ulnar deviation and supination to [facility] to perform ADLs   Time 8   Period Weeks   Status New   PT LONG TERM GOAL #4   Title demo 5/5 Rt wrist strength and grip strength => to Lt.   Time 8   Period Weeks   Status New   PT LONG TERM GOAL #5   Title improve FOTO to 42%   Time 8   Period Weeks   Status New               Plan - 06/08/14 1017    Clinical Impression Statement 59 yr old female s/p ORIF Rt wrist distal radius on 05/25/14. She has restricted Rt wrist ROM and strength affecting ADLS. She has no pain in wrist at present, but reports pain with touch at medial elbow.. Patient would like to return to PLOF without restrictions.   Pt will benefit from skilled therapeutic intervention in order to improve on the following deficits Decreased range of motion;Impaired UE functional use;Decreased activity tolerance;Pain;Impaired flexibility;Hypomobility;Decreased strength   Rehab Potential Excellent   PT Frequency 2x / week   PT Duration 8 weeks   PT Treatment/Interventions ADLs/Self Care Home Management;Cryotherapy;Electrical Stimulation;Iontophoresis  4mg /ml Dexamethasone;Moist  Heat;Ultrasound;Patient/family education;Neuromuscular re-education;Therapeutic exercise;Manual techniques;Scar mobilization;Passive range of motion;Vasopneumatic Device   PT Next Visit Plan ROM, STW, stengthening per protocol   Consulted and Agree with Plan of Care Patient         Problem List Patient Active Problem List   Diagnosis Date Noted  . Hypnic headache 08/06/2013  . OSA treated with BiPAP 08/06/2013  . Hyperlipidemia 04/29/2013  . Shortness of breath 12/10/2012  . Family history of heart disease 12/10/2012  . Atrophic vaginitis 11/17/2012  . Herpes zoster 11/12/2012  . 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 Dyspnea 11/12/2012  . Herpes zoster dermatitis of eyelid 04/21/2012  . Positive ANA (antinuclear antibody) 03/06/2012  . Elevated aldolase level 03/06/2012  . Myalgia 02/22/2012  . Myalgia and myositis 02/21/2012  . Obstructive sleep apnea 02/17/2012  . Headache(784.0) 11/14/2011  . Atypical nevi 11/14/2011  . Depression 11/13/2011  . Menopause 11/13/2011  . Rosacea 11/13/2011  . Urticaria 11/13/2011  . 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 [place], [place], [address] Phone: [phone]   Fax:  [phone]

## 2014-06-08 NOTE — Patient Instructions (Signed)
AROM: Wrist Flexion / Extension  Actively bend right wrist forward then back as far as possible. Repeat _10-20___ times per set. Do ____ sets per session. Do _2-3___ sessions per day.  Copyright  VHI. All rights reserved.  AROM: Wrist Radial / Ulnar Deviation   Gently bend left wrist from side to side as far as possible. Repeat _10-20___ times per set. Do ____ sets per session. Do _2-3___ sessions per day. 2 Copyright  VHI. All rights reserved.  AROM: Forearm Pronation / Supination   With right arm in handshake position, slowly rotate palm down until stretch is felt. Relax. Then rotate palm up until stretch is felt. Repeat 10-20____ times per set. Do ____ sets per session. Do _2_-3_ sessions per day.  Copyright  VHI. All rights reserved.    Wrist Extensor Stretch   Keeping elbow straight, grasp left hand and slowly bend wrist forward until stretch is felt. Hold _30___ seconds. Relax. Repeat __3__ times per set. Do ____ sets per session. Do _2-3_ sessions per day.    Wrist Flexor Stretch Bring your wrist upward to stretch the underside of your wrist/forearm (fingers pointing to ceiling). Same reps as above.  Copyright  VHI. All rights reserved.   Madelyn Flavors, PT 06/08/2014 10:09 AM [facility] [address], [place], [place] [address] Phone # [phone] Fax [phone]

## 2014-06-10 ENCOUNTER — Ambulatory Visit: Payer: BLUE CROSS/BLUE SHIELD | Admitting: Physical Therapy

## 2014-06-10 ENCOUNTER — Encounter: Payer: Self-pay | Admitting: Physical Therapy

## 2014-06-10 DIAGNOSIS — M25631 Stiffness of right wrist, not elsewhere classified: Secondary | ICD-10-CM | POA: Diagnosis not present

## 2014-06-10 DIAGNOSIS — M6281 Muscle weakness (generalized): Secondary | ICD-10-CM

## 2014-06-10 DIAGNOSIS — M25521 Pain in right elbow: Secondary | ICD-10-CM

## 2014-06-10 NOTE — Therapy (Signed)
[facility] [address], [place], [place], [address] Phone: [phone]   Fax:  [phone]  Physical Therapy Treatment  Patient Details  Name: Brianna Ross MRN: [id number] Date of Birth: [birth year] Referring Provider:  Lanice Shirts, *  Encounter Date: 06/10/2014      PT End of Session - 06/10/14 1740    Visit Number 2   Date for PT Re-Evaluation 08/03/14   PT Start Time 1103   PT Stop Time 1201   PT Time Calculation (min) 58 min   Activity Tolerance Patient tolerated treatment well   Behavior During Therapy [facility] for tasks assessed/performed      Past Medical History  Diagnosis Date  . Allergy   . Sleep apnea   . Rosacea   . Depression   . Shingles 03/2012  . ANA positive   . Hives 2011    x5  . Urticaria   . Headache(784.0)   . OSA (obstructive sleep apnea)   . Menopause   . Atypical nevi   . Shortness of breath   . Hyperlipidemia   . Abnormal stress electrocardiogram test     Past Surgical History  Procedure Laterality Date  . Thymic cyst      removal  . Wisdom tooth extraction      There were no vitals filed for this visit.  Visit Diagnosis:  Stiffness of wrist joint, right  Generalized muscle weakness  Pain, elbow joint, right      Subjective Assessment - 06/10/14 1738    Subjective Pt is fearful using her wrist and scared of "metal will pop out"    Multiple Pain Sites No                         [facility] Adult PT Treatment/Exercise - 06/10/14 0001    Exercises   Exercises Wrist;Hand   Hand Exercises   MCPJ Flexion AROM;10 reps   MCPJ Flexion Limitations --  to pt's tolerance    Digit Composite ABduction AROM   Digit Composite ADduction AROM   Opposition AROM  each finger to thumb 2 x 1 min   Other Hand Exercises Digiflex 1.4   2 x 1 min   Wrist Exercises   Wrist Flexion AROM  2 x 32min, in pt's available range   Wrist Extension AROM;Other (comment)   2 x 1 min, in patients available range   Wrist Radial Deviation AROM   Wrist Ulnar Deviation AROM;20 reps;10 reps  forearm supported on table & cushioned with towel   Modalities   Modalities Vasopneumatic   Vasopneumatic   Number Minutes Vasopneumatic  15 minutes   Vasopnuematic Location  Other (comment)  Rt wrist   Vasopneumatic Pressure Medium   Vasopneumatic Temperature  3 snowflakes, arm elevated on bolster and pillow   Manual Therapy   Manual Therapy Soft tissue mobilization   Manual therapy comments educated patient and she practiced STW along incision side   radial side increased tighness, edema in Rt wrist area                  PT Short Term Goals - 06/08/14 1022    PT SHORT TERM GOAL #1   Title I with initial HEP   Time 2   Period Weeks   Status New   PT SHORT TERM GOAL #2   Title improved wrist flexion/ext to 50 and 55 deg respectively   Time 4  Period Weeks   Status New   PT SHORT TERM GOAL #3   Title improved radial/ulnar deviation to 15 and 36 degrees respectively   Time 4   Status New   PT SHORT TERM GOAL #4   Title improved grip strength by 20 # on right   Time 4   Period Weeks   Status New   PT SHORT TERM GOAL #5   Title decreased pain in Rt med epicondyle by 75% or more   Time 4   Period Weeks   Status New           PT Long Term Goals - 06/08/14 1029    PT LONG TERM GOAL #1   Title I with advanced HEP   Time 8   Period Weeks   Status New   PT LONG TERM GOAL #2   Title improved wrist flex/ext to 65 deg to improve function.   Time 8   Period Weeks   Status New   PT LONG TERM GOAL #3   Title improved radial and ulnar deviation and supination to [facility] to perform ADLs   Time 8   Period Weeks   Status New   PT LONG TERM GOAL #4   Title demo 5/5 Rt wrist strength and grip strength => to Lt.   Time 8   Period Weeks   Status New   PT LONG TERM GOAL #5   Title improve FOTO to 42%   Time 8   Period Weeks   Status New                Plan - 06/10/14 1740    Clinical Impression Statement Pt declines pain in wrist, but is fearful of "metal will pop out". Pt is restricted in Rt wrist ROM, with repetition of motion improvement in ROM   Pt will benefit from skilled therapeutic intervention in order to improve on the following deficits Decreased range of motion;Impaired UE functional use;Decreased activity tolerance;Pain;Impaired flexibility;Hypomobility;Decreased strength   Rehab Potential Excellent   PT Frequency 2x / week   PT Duration 8 weeks   PT Treatment/Interventions ADLs/Self Care Home Management;Cryotherapy;Electrical Stimulation;Iontophoresis 4mg /ml Dexamethasone;Moist Heat;Ultrasound;Patient/family education;Neuromuscular re-education;Therapeutic exercise;Manual techniques;Scar mobilization;Passive range of motion;Vasopneumatic Device   PT Next Visit Plan ROM, STW, vasopneumatic   PT Home Exercise Plan establish HEP for AROM wrist   Consulted and Agree with Plan of Care Patient        Problem List Patient Active Problem List   Diagnosis Date Noted  . Hypnic headache 08/06/2013  . OSA treated with BiPAP 08/06/2013  . Hyperlipidemia 04/29/2013  . Shortness of breath 12/10/2012  . Family history of heart disease 12/10/2012  . Atrophic vaginitis 11/17/2012  . Herpes zoster 11/12/2012  . 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 Dyspnea 11/12/2012  . Herpes zoster dermatitis of eyelid 04/21/2012  . Positive ANA (antinuclear antibody) 03/06/2012  . Elevated aldolase level 03/06/2012  . Myalgia 02/22/2012  . Myalgia and myositis 02/21/2012  . Obstructive sleep apnea 02/17/2012  . Headache(784.0) 11/14/2011  . Atypical nevi 11/14/2011  . Depression 11/13/2011  . Menopause 11/13/2011  . Rosacea 11/13/2011  . Urticaria 11/13/2011  . 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 [place], [place], [address] Phone: [phone]   Fax:  [phone]

## 2014-06-15 ENCOUNTER — Ambulatory Visit: Payer: BLUE CROSS/BLUE SHIELD | Admitting: Physical Therapy

## 2014-06-15 ENCOUNTER — Encounter: Payer: Self-pay | Admitting: Physical Therapy

## 2014-06-15 DIAGNOSIS — M25631 Stiffness of right wrist, not elsewhere classified: Secondary | ICD-10-CM

## 2014-06-15 DIAGNOSIS — M6281 Muscle weakness (generalized): Secondary | ICD-10-CM

## 2014-06-15 DIAGNOSIS — M25521 Pain in right elbow: Secondary | ICD-10-CM

## 2014-06-15 NOTE — Therapy (Addendum)
[facility] [address], [place], [place], [address] Phone: [phone]   Fax:  [phone]  Physical Therapy Treatment  Patient Details  Name: Brianna Ross MRN: [id number] Date of Birth: 06-04-1955 Referring Provider:  Lanice Shirts, *  Encounter Date: 06/15/2014      PT End of Session - 06/15/14 0954    Visit Number 3   Date for PT Re-Evaluation 08/03/14   PT Start Time 0930   PT Stop Time 1031   PT Time Calculation (min) 61 min   Activity Tolerance Patient tolerated treatment well   Behavior During Therapy [facility] for tasks assessed/performed      Past Medical History  Diagnosis Date  . Allergy   . Sleep apnea   . Rosacea   . Depression   . Shingles 03/2012  . ANA positive   . Hives 2011    x5  . Urticaria   . Headache(784.0)   . OSA (obstructive sleep apnea)   . Menopause   . Atypical nevi   . Shortness of breath   . Hyperlipidemia   . Abnormal stress electrocardiogram test     Past Surgical History  Procedure Laterality Date  . Thymic cyst      removal  . Wisdom tooth extraction      There were no vitals filed for this visit.  Visit Diagnosis:  Stiffness of wrist joint, right  Generalized muscle weakness  Pain, elbow joint, right      Subjective Assessment - 06/15/14 0939    Subjective Pt reports no pain with rest but with activities having the feeling on distal radial side the plate is blocking her and causing pain up to 6/10   Currently in Pain? No/denies   Multiple Pain Sites No                         [facility] Adult PT Treatment/Exercise - 06/15/14 0001    Exercises   Exercises Elbow   Elbow Exercises   Elbow Flexion AROM  2 x 1 min, with wrist flexion & extension   Wrist Flexion AROM  2x 3mn in available range   Wrist Extension AROM;Other (comment)  2 x 1 min    Hand Exercises   MCPJ Flexion AROM;10 reps  to pt's tolerance   Digit Composite  ABduction AROM  x 1 min   Digit Composite ADduction AROM  x 1 min   Opposition AROM  each finger to thumb 2x178m   Other Hand Exercises Digiflex 1.4   2 x 1 min   Wrist Exercises   Wrist Radial Deviation AROM  2 x 1 min forarm resting on table with towel to cushion   Wrist Ulnar Deviation AROM  2 x 1 min forearm on table with towel for cushioning   Modalities   Modalities Vasopneumatic   Vasopneumatic   Number Minutes Vasopneumatic  15 minutes   Vasopnuematic Location  Other (comment)   Vasopneumatic Pressure Medium   Vasopneumatic Temperature  3 snowflakes, arm elevated on bolster and pillow   Manual Therapy   Manual Therapy Soft tissue mobilization  to Rt wrist and forarm with gentle PROM                  PT Short Term Goals - 06/15/14 0957    PT SHORT TERM GOAL #1   Title I with initial HEP   Time 2   Period Weeks   Status  On-going   PT SHORT TERM GOAL #2   Title improved wrist flexion/ext to 50 and 55 deg respectively   Time 4   Period Weeks   Status On-going   PT SHORT TERM GOAL #3   Title improved radial/ulnar deviation to 15 and 36 degrees respectively   Time 4   Period Weeks   Status On-going   PT SHORT TERM GOAL #4   Time 4   Period Weeks   Status On-going   PT SHORT TERM GOAL #5   Title decreased pain in Rt med epicondyle by 75% or more   Time 4   Period Weeks   Status On-going           PT Long Term Goals - 06/15/14 1000    PT LONG TERM GOAL #1   Title I with advanced HEP   Time 8   Period Weeks   Status On-going   PT LONG TERM GOAL #2   Title improved wrist flex/ext to 65 deg to improve function.   Time 8   Period Weeks   Status On-going   PT LONG TERM GOAL #3   Title improved radial and ulnar deviation and supination to [facility] to perform ADLs   Time 8   Period Weeks   Status On-going   PT LONG TERM GOAL #4   Title demo 5/5 Rt wrist strength and grip strength => to Lt.   Time 8   Period Weeks   Status On-going   PT LONG  TERM GOAL #5   Title improve FOTO to 42%   Time 8   Period Weeks   Status On-going               Plan - 06/15/14 0955    Clinical Impression Statement Pt not more fearful, but restricted with  movement in Rt wrist   Pt will benefit from skilled therapeutic intervention in order to improve on the following deficits Decreased range of motion;Impaired UE functional use;Decreased activity tolerance;Pain;Impaired flexibility;Hypomobility;Decreased strength   Rehab Potential Excellent   PT Frequency 2x / week   PT Duration 8 weeks   PT Treatment/Interventions ADLs/Self Care Home Management;Cryotherapy;Electrical Stimulation;Iontophoresis 47m/ml Dexamethasone;Moist Heat;Ultrasound;Patient/family education;Neuromuscular re-education;Therapeutic exercise;Manual techniques;Scar mobilization;Passive range of motion;Vasopneumatic Device   PT Next Visit Plan ROM, STW, vasopneumatic   PT Home Exercise Plan establish HEP for AROM wrist   Consulted and Agree with Plan of Care Patient        Problem List Patient Active Problem List   Diagnosis Date Noted  . Hypnic headache 08/06/2013  . OSA treated with BiPAP 08/06/2013  . Hyperlipidemia 04/29/2013  . Shortness of breath 12/10/2012  . Family history of heart disease 12/10/2012  . Atrophic vaginitis 11/17/2012  . Herpes zoster 11/12/2012  . 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 Dyspnea 11/12/2012  . Herpes zoster dermatitis of eyelid 04/21/2012  . Positive ANA (antinuclear antibody) 03/06/2012  . Elevated aldolase level 03/06/2012  . Myalgia 02/22/2012  . Myalgia and myositis 02/21/2012  . Obstructive sleep apnea 02/17/2012  . Headache(784.0) 11/14/2011  . Atypical nevi 11/14/2011  . Depression 11/13/2011  . Menopause 11/13/2011  . Rosacea 11/13/2011  . Urticaria 11/13/2011  . 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  Pt didn't return to PT after 3rd visit.     Remaining  deficits: Unknown as pt didn't return to PT.  Pt will follow-up with MD if need to return to PT.     Education / Equipment: HEP Plan: Patient agrees to discharge.  Patient goals were not met. Patient is being discharged due to not returning since the last visit.  ?????   Sigurd Sos, PT 08/24/2014 8:52 AM  [facility] [address] [place], [place], [address] Phone: [phone]   Fax:  [phone]

## 2014-06-18 ENCOUNTER — Encounter: Payer: BLUE CROSS/BLUE SHIELD | Admitting: Physical Therapy

## 2014-06-22 ENCOUNTER — Ambulatory Visit: Payer: BLUE CROSS/BLUE SHIELD

## 2014-06-25 ENCOUNTER — Encounter: Payer: BLUE CROSS/BLUE SHIELD | Admitting: Physical Therapy

## 2014-06-29 ENCOUNTER — Encounter: Payer: BLUE CROSS/BLUE SHIELD | Admitting: Physical Therapy

## 2014-06-29 ENCOUNTER — Ambulatory Visit (INDEPENDENT_AMBULATORY_CARE_PROVIDER_SITE_OTHER): Payer: BLUE CROSS/BLUE SHIELD | Admitting: Internal Medicine

## 2014-06-29 ENCOUNTER — Encounter: Payer: Self-pay | Admitting: Internal Medicine

## 2014-06-29 VITALS — BP 102/78 | HR 99 | Temp 97.9°F | Resp 14 | Ht 64.5 in | Wt 131.8 lb

## 2014-06-29 DIAGNOSIS — F32A Depression, unspecified: Secondary | ICD-10-CM

## 2014-06-29 DIAGNOSIS — H9190 Unspecified hearing loss, unspecified ear: Secondary | ICD-10-CM | POA: Insufficient documentation

## 2014-06-29 DIAGNOSIS — F329 Major depressive disorder, single episode, unspecified: Secondary | ICD-10-CM

## 2014-06-29 DIAGNOSIS — H9193 Unspecified hearing loss, bilateral: Secondary | ICD-10-CM

## 2014-06-29 NOTE — Assessment & Plan Note (Signed)
Does have noise exposure, ears cleaned at today's visit to see if that helps. She does not want hearing test at this time due to no impairment to her daily life.

## 2014-06-29 NOTE — Progress Notes (Signed)
Pre visit review using our clinic review tool, if applicable. No additional management support is needed unless otherwise documented below in the visit note. 

## 2014-06-29 NOTE — Assessment & Plan Note (Signed)
Still seeing psych and counselor. On abilify, remeron, pristiq and finally improving. She would like to be off some of these medicines eventually. Given list of stress reducing activities. She is going through both an adjustment to some impairment to her health and to her parents (loss of father 1 year ago and mom with early dementia now in ALF).

## 2014-06-29 NOTE — Patient Instructions (Signed)
We have cleaned out the ears today to get out the wax.   We do not need to check any blood work today but will see you back in the fall or winter for the physical.   If you have any problems or questions please feel free to call the office.   Stress and Stress Management Stress is a normal reaction to life events. It is what you feel when life demands more than you are used to or more than you can handle. Some stress can be useful. For example, the stress reaction can help you catch the last bus of the day, study for a test, or meet a deadline at work. But stress that occurs too often or for too long can cause problems. It can affect your emotional health and interfere with relationships and normal daily activities. Too much stress can weaken your immune system and increase your risk for physical illness. If you already have a medical problem, stress can make it worse. CAUSES  All sorts of life events may cause stress. An event that causes stress for one person may not be stressful for another person. Major life events commonly cause stress. These may be positive or negative. Examples include losing your job, moving into a new home, getting married, having a baby, or losing a loved one. Less obvious life events may also cause stress, especially if they occur day after day or in combination. Examples include working long hours, driving in traffic, caring for children, being in debt, or being in a difficult relationship. SIGNS AND SYMPTOMS Stress may cause emotional symptoms including, the following:  Anxiety. This is feeling worried, afraid, on edge, overwhelmed, or out of control.  Anger. This is feeling irritated or impatient.  Depression. This is feeling sad, down, helpless, or guilty.  Difficulty focusing, remembering, or making decisions. Stress may cause physical symptoms, including the following:   Aches and pains. These may affect your head, neck, back, stomach, or other areas of your  body.  Tight muscles or clenched jaw.  Low energy or trouble sleeping. Stress may cause unhealthy behaviors, including the following:   Eating to feel better (overeating) or skipping meals.  Sleeping too little, too much, or both.  Working too much or putting off tasks (procrastination).  Smoking, drinking alcohol, or using drugs to feel better. DIAGNOSIS  Stress is diagnosed through an assessment by your health care provider. Your health care provider will ask questions about your symptoms and any stressful life events.Your health care provider will also ask about your medical history and may order blood tests or other tests. Certain medical conditions and medicine can cause physical symptoms similar to stress. Mental illness can cause emotional symptoms and unhealthy behaviors similar to stress. Your health care provider may refer you to a mental health professional for further evaluation.  TREATMENT  Stress management is the recommended treatment for stress.The goals of stress management are reducing stressful life events and coping with stress in healthy ways.  Techniques for reducing stressful life events include the following:  Stress identification. Self-monitor for stress and identify what causes stress for you. These skills may help you to avoid some stressful events.  Time management. Set your priorities, keep a calendar of events, and learn to say "no." These tools can help you avoid making too many commitments. Techniques for coping with stress include the following:  Rethinking the problem. Try to think realistically about stressful events rather than ignoring them or overreacting. Try to find  the positives in a stressful situation rather than focusing on the negatives.  Exercise. Physical exercise can release both physical and emotional tension. The key is to find a form of exercise you enjoy and do it regularly.  Relaxation techniques. These relax the body and mind.  Examples include yoga, meditation, tai chi, biofeedback, deep breathing, progressive muscle relaxation, listening to music, being out in nature, journaling, and other hobbies. Again, the key is to find one or more that you enjoy and can do regularly.  Healthy lifestyle. Eat a balanced diet, get plenty of sleep, and do not smoke. Avoid using alcohol or drugs to relax.  Strong support network. Spend time with family, friends, or other people you enjoy being around.Express your feelings and talk things over with someone you trust. Counseling or talktherapy with a mental health professional may be helpful if you are having difficulty managing stress on your own. Medicine is typically not recommended for the treatment of stress.Talk to your health care provider if you think you need medicine for symptoms of stress. HOME CARE INSTRUCTIONS  Keep all follow-up visits as directed by your health care provider.  Take all medicines as directed by your health care provider. SEEK MEDICAL CARE IF:  Your symptoms get worse or you start having new symptoms.  You feel overwhelmed by your problems and can no longer manage them on your own. SEEK IMMEDIATE MEDICAL CARE IF:  You feel like hurting yourself or someone else. Document Released: 06/13/2000 Document Revised: 05/04/2013 Document Reviewed: 08/12/2012 [facility] Patient Information 2015 [place], [place]. This information is not intended to replace advice given to you by your health care provider. Make sure you discuss any questions you have with your health care provider.

## 2014-06-29 NOTE — Progress Notes (Signed)
   Subjective:    Patient ID: Brianna Ross, female    DOB: 02/19/1955, 59 y.o.   MRN: [id number]  HPI The patient is a 59 YO female who is coming in for some mild hearing loss. 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 She has also felt like they are blocked at times and tries to use warm water in the shower to cleanse them. More high frequency sounds. Does not interfere with her everyday life. Has gradually worsened over time.   PMH, [facility], social history reviewed and updated.   Review of Systems  Constitutional: Negative.   HENT: Positive for hearing loss. Negative for congestion, ear discharge and ear pain.   Eyes: Negative.   Respiratory: Negative.   Cardiovascular: Negative.   Gastrointestinal: Negative.   Musculoskeletal: Negative.   Skin: Negative.   Neurological: Negative.   Psychiatric/Behavioral: Positive for dysphoric mood. Negative for behavioral problems, sleep disturbance and agitation. The patient is not nervous/anxious.       Objective:   Physical Exam  Constitutional: She is oriented to person, place, and time. She appears well-developed and well-nourished.  HENT:  Head: Normocephalic and atraumatic.  Right and left ear with large balls of wax in the canals, TMs normal after lavage.   Eyes: EOM are normal.  Neck: Normal range of motion.  Cardiovascular: Normal rate and regular rhythm.   Pulmonary/Chest: Effort normal and breath sounds normal. No respiratory distress. She has no wheezes. She has no rales.  Abdominal: Soft. She exhibits no distension. There is no tenderness. There is no rebound.  Musculoskeletal: She exhibits no edema.  Neurological: She is alert and oriented to person, place, and time.  Skin: Skin is warm and dry.  Psychiatric: She has a normal mood and affect.   Filed Vitals:   06/29/14 0800  BP: 102/78  Pulse: 99  Temp: 97.9 F (36.6 C)  TempSrc: Oral  Resp: 14  Height: 5' 4.5" (1.638 m)  Weight: 131 lb 12.8 oz  (59.784 kg)  SpO2: 98%      Assessment & Plan:

## 2014-07-02 ENCOUNTER — Encounter: Payer: BLUE CROSS/BLUE SHIELD | Admitting: Physical Therapy

## 2014-07-08 ENCOUNTER — Other Ambulatory Visit: Payer: Self-pay | Admitting: General Surgery

## 2014-08-03 ENCOUNTER — Encounter: Payer: Self-pay | Admitting: *Deleted

## 2014-08-11 ENCOUNTER — Encounter: Payer: Self-pay | Admitting: Neurology

## 2014-08-11 ENCOUNTER — Ambulatory Visit (INDEPENDENT_AMBULATORY_CARE_PROVIDER_SITE_OTHER): Payer: BLUE CROSS/BLUE SHIELD | Admitting: Neurology

## 2014-08-11 VITALS — BP 124/80 | HR 84 | Resp 20 | Ht 65.0 in | Wt 141.0 lb

## 2014-08-11 DIAGNOSIS — G4733 Obstructive sleep apnea (adult) (pediatric): Secondary | ICD-10-CM

## 2014-08-11 DIAGNOSIS — Z9989 Dependence on other enabling machines and devices: Secondary | ICD-10-CM

## 2014-08-11 DIAGNOSIS — F329 Major depressive disorder, single episode, unspecified: Secondary | ICD-10-CM

## 2014-08-11 DIAGNOSIS — F450 Somatization disorder: Secondary | ICD-10-CM

## 2014-08-11 DIAGNOSIS — F32A Depression, unspecified: Secondary | ICD-10-CM

## 2014-08-11 NOTE — Progress Notes (Signed)
SLEEP MEDICINE CLINIC   Provider:  Larey Seat, M D  Referring Provider: Lanice Shirts, * Primary Care Physician:  Olga Millers, MD  Chief Complaint  Patient presents with  . Follow-up    cpap, rm 11, alone    HPI:  Brianna Ross is a 59 y.o. female , who  is seen here as a revisit  from Dr. Coralyn Mark for CPAP compliance.    I first encountered Brianna Ross after she endorsed excessive daytime sleepiness in the setting of some depression for over 30 years. She had initially and Doris T. Epworth sleepiness score at 15 points, a very high reading she underwent a polysomnography and then return for a titration study on 3-to-14 the patient had insomnia but also became evident that she needed to be AP therapy AHI was 57.9 per hour of sleep and a very high degree of sleep apnea. The patient then started on BiPAP with 13 cm water over  8 cm water. During the titration study she was able to sleep 67 minutes of which 10 minutes were rapid eye movement sleep. Her last download from 06-19-12 shoulder user time of 6 hours and 48 minutes.  Today, she appears much refreshed and endorsed the fatigue severity scale at 12 points only and the Epworth sleepiness score at 4 points down from 15.  Brianna Ross's VPAP shows a residual AHI of 8.3, a respiratory rate of 14 per minute tidal volume of 9 20 mm . inspiratory pressure of  13  centimeter water , expiratory pressure 8 cm water . The  time of use of BiPAP/ VPAP is 6 hours and 51 minutes , with  94% compliance. Her sleep habits are as follows:  She goes to bed at 11 Pm , falls asleep promptly and sleeps through until 6 AM . She has an alarm clock set .  She drinks 1 cup of coffee in the morning, and 2 cups of tea in daytime.  She fees well and has been "loving " CPAP sleep, but the mask took some getting used to.   Interval history from 08-11-14, Mrs. Leber has a new primary care physician, Dr. Jake Bathe.  Her headaches and her sleep habits   Are stable, nothing has changed and this is a routine revisit.  In spring  She fought a severe depressive episode , but her sleep normalized by June- July. Epworth sleepiness score is endorsed at 5 points and fatigue severity score at 15 points.  CPAP compliance is 80% for over 4 hours of daily use average time of use is 5 hours and 40 minutes during a brief period of depression in spring she was partially noncompliant with her CPAP use but now she has begun using it again. Her AHI is 4.8 which is sufficient and she does have moderate to high air leaks. She received a new mask and gear 3 weeks ago.     Review of Systems: Out of a complete 14 system review, the patient complains of only the following symptoms, and all other reviewed systems are negative.  The patient compliance has exceeded 88% for CPAP over the last 15 months.  Morning headaches have completely resolved sleepiness is much improved attention is improved. She has nor nocturia and on CPAP.   Epworth score 4 , Fatigue severity score 12   , depression score 3    Social History   Social History  . Marital Status: Married    Spouse Name: Brianna Ross  . Number  of Children: 0  . Years of Education: 16   Occupational History  .     Social History Main Topics  . Smoking status: Never Smoker   . Smokeless tobacco: Never Used  . Alcohol Use: Yes     Comment: rarely  . Drug Use: No  . Sexual Activity: Yes    Birth Control/ Protection: Post-menopausal   Other Topics Concern  . Not on file   Social History Narrative   Patient works for [employer].(full-time_    Patient lives at home with her husband Brianna Ross).   Patient does not have any children.   Patient is right-handed.   Patient drinks one cup of coffee everyday and two cups of tea daily.    Family History  Problem Relation Age of Onset  . Glaucoma Mother   . Hyperlipidemia Mother   . Depression Mother   . Osteoporosis Mother   . Heart disease  Father   . Hyperlipidemia Father   . Hypertension Father   . Heart defect Sister   . Heart disease Sister 13    congenital heart defect,deceased  . Hyperlipidemia Brother   . Depression Brother   . High Cholesterol Brother   . Cancer Paternal Grandmother   . Stroke Paternal Grandfather   . Heart disease Paternal Grandfather     Past Medical History  Diagnosis Date  . Allergy   . Sleep apnea   . Rosacea   . Depression   . Shingles 03/2012  . ANA positive   . Hives 2011    x5  . Urticaria   . Headache(784.0)   . OSA (obstructive sleep apnea)   . Menopause   . Atypical nevi   . Shortness of breath   . Hyperlipidemia   . Abnormal stress electrocardiogram test   . Right wrist fracture     Past Surgical History  Procedure Laterality Date  . Thymic cyst      removal  . Wisdom tooth extraction    . Orif wrist fracture      Current Outpatient Prescriptions  Medication Sig Dispense Refill  . acetaminophen (TYLENOL) 500 MG tablet Take 500 mg by mouth 2 (two) times daily as needed (pain).    . ARIPiprazole (ABILIFY) 2 MG tablet TAKE 1/2 TABLET BY MOUTH EVERY MORNING X7DAYS, THEN INCREASE TO 1 TABLET EVERY MORNING  2  . aspirin EC 81 MG tablet Take 81 mg by mouth at bedtime.     . cholecalciferol (VITAMIN D) 1000 UNITS tablet Take 1,000 Units by mouth. Taking three times a week    . desvenlafaxine (PRISTIQ) 50 MG 24 hr tablet Take 50 mg by mouth daily.     . dorzolamide-timolol (COSOPT) 22.3-6.8 MG/ML ophthalmic solution Place 1 drop into the left eye 2 (two) times daily.     . hydroxychloroquine (PLAQUENIL) 200 MG tablet Take 200 mg by mouth daily.     . metroNIDAZOLE (METROGEL) 1 % gel Apply 1 application topically 3 (three) times a week. Use any 3 days weekly    . mirtazapine (REMERON) 30 MG tablet Take 30 mg by mouth at bedtime.  1  . Multiple Vitamins-Minerals (MULTIVITAMIN ADULTS 50+ PO) Take 1 tablet by mouth. Three times a week    . prednisoLONE acetate (PRED FORTE)  1 % ophthalmic suspension   3  . traZODone (DESYREL) 100 MG tablet   2  . valACYclovir (VALTREX) 1000 MG tablet Take 1,000 mg by mouth at bedtime.  No current facility-administered medications for this visit.    Allergies as of 08/11/2014  . (No Known Allergies)    Vitals: BP 124/80 mmHg  Pulse 84  Resp 20  Ht 5\' 5"  (1.651 m)  Wt 141 lb (63.957 kg)  BMI 23.46 kg/m2 Last Weight:  Wt Readings from Last 1 Encounters:  08/11/14 141 lb (63.957 kg)       Last Height:   Ht Readings from Last 1 Encounters:  08/11/14 5\' 5"  (1.651 m)    Physical exam:  General: The patient is awake, alert and appears not in acute distress. The patient is well groomed. Head: Normocephalic, atraumatic. Neck is supple. Mallampati 2 ,  neck circumference:14  Nasal airflow unrestricted, TMJ is evident. Retrognathia is not seen.  Cardiovascular:  Regular rate and rhythm , without  murmurs or carotid bruit, and without distended neck veins. Respiratory: Lungs are clear to auscultation. Skin:  Without evidence of edema, or rash Trunk: BMI is elevated and patient  has normal posture.  Neurologic exam : The patient is awake and alert, oriented to place and time.   Memory subjective  described as intact.  There is a normal attention span & concentration ability.  Speech is fluent without dysarthria, dysphonia or aphasia. Mood and affect are appropriate.  Cranial nerves: Pupils are equal and briskly reactive to light. Funduscopic exam without  evidence of pallor or edema.  Extraocular movements  in vertical and horizontal planes intact and without nystagmus. Visual fields by finger perimetry are intact. Hearing to finger rub intact.  Facial sensation intact to fine touch. Facial motor strength is symmetric and tongue and uvula move midline. Motor exam:   Normal tone ,muscle bulk and symmetric ,strength in all extremities. Sensory:  Fine touch, pinprick and vibration were tested in all extremities.  Proprioception is normal. Coordination: Rapid alternating movements in the fingers/hands is normal.  Finger-to-nose maneuver normal without evidence of ataxia, dysmetria or tremor.  Gait and station: Patient walks without assistive device . Deep tendon reflexes: in the  upper and lower extremities are symmetric and intact. Babinski maneuver response is  downgoing.   Assessment:  After physical and neurologic examination, review of laboratory studies, imaging, neurophysiology testing and pre-existing records, assessment is  1) OSA, clinically well responding to CPAP. She remains on the same setting.  2) Migraine and hypnic headaches clinically well responding to CPAP.  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 clinically well responding to CPAP. 4) major depressive episodes, currently at baseline. Sleep was affected by early morning arousals and difficulties to initiate sleep.  Her mother has dementia and she feels overwhelmed being a caretaker.  She has moved to assisted living, [facility]. Fuchs dystrophy patient , 5 times a day eye ointment.      The patient was advised of the nature of the diagnosed sleep disorder , the treatment options and risks for general a health and wellness arising from not treating the condition. Visit duration was 15 minutes.   Plan:  Treatment plan and additional workup : continue CPAP use, compliance established , RV in 12 month with me.     Asencion Partridge Everardo Voris MD  08/11/2014

## 2014-12-08 ENCOUNTER — Encounter: Payer: BC Managed Care – PPO | Admitting: Internal Medicine

## 2015-01-12 ENCOUNTER — Encounter: Payer: BLUE CROSS/BLUE SHIELD | Admitting: Internal Medicine

## 2015-02-04 ENCOUNTER — Ambulatory Visit
Admission: RE | Admit: 2015-02-04 | Discharge: 2015-02-04 | Disposition: A | Discharge status: Home / Self Care | Payer: BLUE CROSS/BLUE SHIELD | Source: Ambulatory Visit | Attending: Nurse Practitioner | Admitting: Nurse Practitioner

## 2015-02-04 ENCOUNTER — Other Ambulatory Visit: Payer: Self-pay | Admitting: Nurse Practitioner

## 2015-02-04 DIAGNOSIS — K5900 Constipation, unspecified: Secondary | ICD-10-CM

## 2015-02-24 DIAGNOSIS — F322 Major depressive disorder, single episode, severe without psychotic features: Secondary | ICD-10-CM | POA: Insufficient documentation

## 2015-08-10 ENCOUNTER — Ambulatory Visit (INDEPENDENT_AMBULATORY_CARE_PROVIDER_SITE_OTHER): Payer: BLUE CROSS/BLUE SHIELD | Admitting: Neurology

## 2015-08-10 ENCOUNTER — Encounter: Payer: Self-pay | Admitting: Neurology

## 2015-08-10 VITALS — BP 122/78 | HR 88 | Resp 20 | Ht 65.0 in | Wt 146.0 lb

## 2015-08-10 DIAGNOSIS — G4733 Obstructive sleep apnea (adult) (pediatric): Secondary | ICD-10-CM

## 2015-08-10 NOTE — Patient Instructions (Signed)
Please remember to try to maintain good sleep hygiene, which means: Keep a regular sleep and wake schedule, try not to exercise or have a meal within 2 hours of your bedtime, try to keep your bedroom conducive for sleep, that is, cool and dark, without light distractors such as an illuminated alarm clock, and refrain from watching TV right before sleep or in the middle of the night and do not keep the TV or radio on during the night. Also, try not to use or play on electronic devices at bedtime, such as your cell phone, tablet PC or laptop. If you like to read at bedtime on an electronic device, try to dim the background light as much as possible. Do not eat in the middle of the night.     

## 2015-08-10 NOTE — Progress Notes (Signed)
[facility]   Provider:  Larey Seat, M D  Referring Provider: Hoyt Koch, * Primary Care Physician:  Kelton Pillar, MD  Chief Complaint  Patient presents with  . Follow-up    cpap, uses [facility]    HPI:  Brianna Ross is a 60 y.o. female , who  is seen here as a revisit  from Dr. Sharlet Salina for CPAP compliance.    I first encountered Brianna Ross after she endorsed excessive daytime sleepiness in the setting of some depression for over 30 years.  She had initially endorsed the  Epworth sleepiness score at 15 points, a very high reading.  She underwent a polysomnography  Was diagnsoed with OSA and then return for a titration study on 3-2 -14 . The  patient had insomnia but also became evident that she needed to be on CPAP  Therapy, as her AHI was 57.9 per hour of sleep.  The patient then started on BiPAP with 13 cm water over 8 cm water. During the titration study she was able to sleep 67 minutes of which 10 minutes were rapid eye movement sleep. Her last download from 06-19-12 showed a  user time of 6 hours and 48 minutes. She responded being  refreshed and endorsed the fatigue severity scale at 12 points only and the Epworth sleepiness score at 4 points down from 15.  Brianna Ross's VPAP shows a residual AHI of 8.3, a respiratory rate of 14 per minute tidal volume of 9 20 mm . inspiratory pressure of 13  centimeter water , expiratory pressure 8 cm water .The  time of use of BiPAP/ VPAP is 6 hours and 51 minutes , with  94% compliance.Her sleep habits are as follows: She goes to bed at 11 Pm , falls asleep promptly and sleeps through until 6 AM . She has an alarm clock set .  She drinks 1 cup of coffee in the morning, and 2 cups of tea in daytime.  She fees well and has been "loving " CPAP sleep, but the mask took some getting used to.   Interval history from 08-11-14, Brianna Ross has a new primary care physician, Dr. Jake Bathe.  Her headaches and her sleep habits  Are stable,  nothing has changed and this is a routine revisit.  In spring  She fought a severe depressive episode , but her sleep normalized by June- July. Epworth sleepiness score is endorsed at 5 points and fatigue severity score at 15 points. CPAP compliance is 80% for over 4 hours of daily use average time of use is 5 hours and 40 minutes during a brief period of depression in spring she was partially noncompliant with her CPAP use but now she has begun using it again. Her AHI is 4.8 which is sufficient and she does have moderate to high air leaks. She received a new mask and gear 3 weeks ago.   Interval history from 08/10/2015, I have the pleasure of seeing Brianna Ross today in a follow-up visit for compliance with her BiPAP-ASV. The patient has 97% compliance for daily use an average of 7 hours and 2 minutes, she has an inspiratory pressure support of 13 expiratory pressure of 8 cm water and a residual AHI of 12.4 which is a little high. The residual apnea seem to be all obstructive in nature I have also noticed a significant amount of new air leaks. She feels also that she has to change interface it would probably be best to  address the leak first before we do any other changes to her settings. She endorsed only 5 points on the Epworth sleepiness score and 10 on the fatigue severity score, so clinically she is doing very well.    Review of Systems: Out of a complete 14 system review, the patient complains of only the following symptoms, and all other reviewed systems are negative.  The patient compliance has exceeded 93% for PAP  Morning headaches have completely resolved sleepiness is much improved attention is improved. She has nor nocturia and on CPAP.   Epworth score 4 , Fatigue severity score 12   , depression score 3    Social History   Social History  . Marital status: Married    Spouse name: Brianna Ross  . Number of children: 0  . Years of education: 18   Occupational History  .   [employer]   Social History Main Topics  . Smoking status: Never Smoker  . Smokeless tobacco: Never Used  . Alcohol use Yes     Comment: rarely  . Drug use: No  . Sexual activity: Yes    Birth control/ protection: Post-menopausal   Other Topics Concern  . Not on file   Social History Narrative   Patient works for [employer].(full-time_    Patient lives at home with her husband Brianna Ross).   Patient does not have any children.   Patient is right-handed.   Patient drinks one cup of coffee everyday and two cups of tea daily.    Family History  Problem Relation Age of Onset  . Glaucoma Mother   . Hyperlipidemia Mother   . Depression Mother   . Osteoporosis Mother   . Heart disease Father   . Hyperlipidemia Father   . Hypertension Father   . Heart defect Sister   . Heart disease Sister 13    congenital heart defect,deceased  . Hyperlipidemia Brother   . Depression Brother   . High Cholesterol Brother   . Cancer Paternal Grandmother   . Stroke Paternal Grandfather   . Heart disease Paternal Grandfather     Past Medical History:  Diagnosis Date  . Abnormal stress electrocardiogram test   . Allergy   . ANA positive   . Atypical nevi   . Depression   . Headache(784.0)   . Hives 2011   x5  . Hyperlipidemia   . Menopause   . OSA (obstructive sleep apnea)   . Right wrist fracture   . Rosacea   . Shingles 03/2012  . Shortness of breath   . Sleep apnea   . Urticaria     Past Surgical History:  Procedure Laterality Date  . ORIF WRIST FRACTURE    . thymic cyst     removal  . WISDOM TOOTH EXTRACTION      Current Outpatient Prescriptions  Medication Sig Dispense Refill  . acetaminophen (TYLENOL) 500 MG tablet Take 500 mg by mouth 2 (two) times daily as needed (pain).    . ARIPiprazole (ABILIFY) 2 MG tablet TAKE 1/2 TABLET BY MOUTH EVERY MORNING X7DAYS, THEN INCREASE TO 1 TABLET EVERY MORNING  2  . aspirin EC 81 MG tablet  Take 81 mg by mouth at bedtime.     Marland Kitchen desvenlafaxine (PRISTIQ) 50 MG 24 hr tablet Take 50 mg by mouth daily.     . fluorometholone (FML) 0.1 % ophthalmic suspension Place 1 drop into the left eye daily.    . hydroxychloroquine (PLAQUENIL) 200 MG tablet  Take 200 mg by mouth daily.     . valACYclovir (VALTREX) 1000 MG tablet Take 1,000 mg by mouth at bedtime.      No current facility-administered medications for this visit.     Allergies as of 08/10/2015  . (No Known Allergies)    Vitals: BP 122/78   Pulse 88   Resp 20   Ht 5\' 5"  (1.651 m)   Wt 146 lb (66.2 kg)   BMI 24.30 kg/m  Last Weight:  Wt Readings from Last 1 Encounters:  08/10/15 146 lb (66.2 kg)       Last Height:   Ht Readings from Last 1 Encounters:  08/10/15 5\' 5"  (1.651 m)    Physical exam:  General: The patient is awake, alert and appears not in acute distress. The patient is well groomed. Head: Normocephalic, atraumatic. Neck is supple. Mallampati 2 ,  neck circumference:14  Nasal airflow unrestricted, TMJ is evident. Retrognathia is not seen.  Cardiovascular:  Regular rate and rhythm , without  murmurs or carotid bruit, and without distended neck veins. Respiratory: Lungs are clear to auscultation. Skin:  Without evidence of edema, or rash Trunk: BMI is elevated and patient  has normal posture.  Neurologic exam : The patient is awake and alert, oriented to place and time.   Memory subjective  described as intact.  There is a normal attention span & concentration ability.  Speech is fluent without dysarthria, dysphonia or aphasia. Mood and affect are appropriate.  Cranial nerves: Pupils are equal and briskly reactive to light. Funduscopic exam without  evidence of pallor or edema.  Extraocular movements  in vertical and horizontal planes intact and without nystagmus. Visual fields by finger perimetry are intact. Hearing to finger rub intact.  Facial sensation intact to fine touch. Facial motor strength is  symmetric and tongue and uvula move midline. Motor exam:   Normal tone ,muscle bulk and symmetric ,strength in all extremities. Sensory:  Fine touch, pinprick and vibration were tested in all extremities. Proprioception is normal. Coordination: Rapid alternating movements in the fingers/hands is normal.  Finger-to-nose maneuver normal without evidence of ataxia, dysmetria or tremor. Gait and station: Patient walks without assistive device . Deep tendon reflexes: in the  upper and lower extremities are symmetric and intact. Babinski maneuver response is  downgoing.   Assessment:  After physical and neurologic examination, review of laboratory studies, imaging, neurophysiology testing and pre-existing records, assessment is  1) OSA, clinically well responding to CPAP. Her [facility] medical [facility], based on the high residual AHI of 12.4 and the very high indicated air leaks I have changed her prescription to an AIR TOUCH F 20  medium size. She tried this mask on in the office and likes the setting. I hope this would with a reduction of the air leaks a reduction in apnea index will follow.   The patient was advised of the nature of the diagnosed sleep disorder , the treatment options and risks for general a health and wellness arising from not treating the condition. Visit duration was 15 minutes.   Plan:  Treatment plan and additional workup : continue CPAP use, compliance established , RV in 12 month with me. Notes to [facility] about new mask order medium size full facemask, air touch by [employer], F 20. Medium size      Asencion Partridge Valente Fosberg MD  08/10/2015

## 2015-11-07 ENCOUNTER — Other Ambulatory Visit: Payer: Self-pay | Admitting: Rheumatology

## 2015-11-07 DIAGNOSIS — Z79899 Other long term (current) drug therapy: Secondary | ICD-10-CM

## 2015-11-07 DIAGNOSIS — D8989 Other specified disorders involving the immune mechanism, not elsewhere classified: Secondary | ICD-10-CM

## 2015-11-07 NOTE — Progress Notes (Signed)
double-stranded DNA, complements, sed rate, beta 2, anticardiolipin, UA with the next labs [facility] CMP

## 2015-11-08 LAB — COMPLETE METABOLIC PANEL WITH GFR
ALBUMIN: 4.5 g/dL (ref 3.6–5.1)
ALK PHOS: 51 U/L (ref 33–130)
ALT: 19 U/L (ref 6–29)
AST: 18 U/L (ref 10–35)
BUN: 10 mg/dL (ref 7–25)
CO2: 30 mmol/L (ref 20–31)
CREATININE: 0.72 mg/dL (ref 0.50–1.05)
Calcium: 9.9 mg/dL (ref 8.6–10.4)
Chloride: 102 mmol/L (ref 98–110)
GFR, Est African American: >89 mL/min (ref >=60)
GFR, Est Non African American: >89 mL/min (ref >=60)
GLUCOSE: 79 mg/dL (ref 65–99)
Potassium: 4 mmol/L (ref 3.5–5.3)
SODIUM: 140 mmol/L (ref 135–146)
Total Bilirubin: 0.5 mg/dL (ref 0.2–1.2)
Total Protein: 6.5 g/dL (ref 6.1–8.1)

## 2015-11-08 LAB — CBC WITH DIFFERENTIAL/PLATELET
BASOS PCT: 0 %
Basophils Absolute: 0 cells/uL (ref 0–200)
EOS PCT: 3 %
Eosinophils Absolute: 138 cells/uL (ref 15–500)
HCT: 41.8 % (ref 35.0–45.0)
HEMOGLOBIN: 14.1 g/dL (ref 11.7–15.5)
LYMPHS ABS: 1242 {cells}/uL (ref 850–3900)
Lymphocytes Relative: 27 %
MCH: 30.7 pg (ref 27.0–33.0)
MCHC: 33.7 g/dL (ref 32.0–36.0)
MCV: 90.9 fL (ref 80.0–100.0)
MONO ABS: 552 {cells}/uL (ref 200–950)
MPV: 9.3 fL (ref 7.5–12.5)
Monocytes Relative: 12 %
Neutro Abs: 2668 cells/uL (ref 1500–7800)
Neutrophils Relative %: 58 %
Platelets: 254 10*3/uL (ref 140–400)
RBC: 4.6 MIL/uL (ref 3.80–5.10)
RDW: 13.3 % (ref 11.0–15.0)
WBC: 4.6 10*3/uL (ref 3.8–10.8)

## 2015-11-08 LAB — URINALYSIS, ROUTINE W REFLEX MICROSCOPIC
BILIRUBIN URINE: NEGATIVE
Glucose, UA: NEGATIVE
Hgb urine dipstick: NEGATIVE
Ketones, ur: NEGATIVE
Leukocytes, UA: NEGATIVE
Nitrite: NEGATIVE
PROTEIN: NEGATIVE
Specific Gravity, Urine: 1.003 (ref 1.001–1.035)
pH: 6.5 (ref 5.0–8.0)

## 2015-11-08 LAB — SEDIMENTATION RATE: Sed Rate: 1 mm/hr (ref 0–30)

## 2015-11-08 LAB — CARDIOLIPIN ANTIBODIES, IGG, IGM, IGA
Anticardiolipin IgA: <11 [APL'U]
Anticardiolipin IgG: <14 [GPL'U]

## 2015-11-08 LAB — BETA-2 GLYCOPROTEIN ANTIBODIES
Beta-2 Glyco I IgG: <9 SGU (ref <=20)
Beta-2-Glycoprotein I IgA: 16 SAU (ref <=20)
Beta-2-Glycoprotein I IgM: <9 SMU (ref <=20)

## 2015-11-09 ENCOUNTER — Telehealth: Payer: Self-pay | Admitting: Radiology

## 2015-11-09 NOTE — Telephone Encounter (Signed)
I have called patient to advise labs are normal  

## 2015-11-09 NOTE — Telephone Encounter (Signed)
-----   Message from Bo Merino, MD sent at 11/08/2015  1:34 PM EST ----- All labs normal

## 2015-11-17 ENCOUNTER — Other Ambulatory Visit (HOSPITAL_COMMUNITY)
Admission: RE | Admit: 2015-11-17 | Discharge: 2015-11-17 | Disposition: A | Discharge status: Home / Self Care | Payer: BLUE CROSS/BLUE SHIELD | Source: Ambulatory Visit | Attending: Internal Medicine | Admitting: Internal Medicine

## 2015-11-17 ENCOUNTER — Other Ambulatory Visit: Payer: Self-pay | Admitting: Internal Medicine

## 2015-11-17 DIAGNOSIS — Z01419 Encounter for gynecological examination (general) (routine) without abnormal findings: Secondary | ICD-10-CM | POA: Diagnosis present

## 2015-11-17 DIAGNOSIS — Z1151 Encounter for screening for human papillomavirus (HPV): Secondary | ICD-10-CM | POA: Diagnosis not present

## 2015-11-22 LAB — CYTOLOGY - PAP
Diagnosis: NEGATIVE
HPV (WINDOPATH): NOT DETECTED

## 2016-02-20 DIAGNOSIS — Z8669 Personal history of other diseases of the nervous system and sense organs: Secondary | ICD-10-CM | POA: Insufficient documentation

## 2016-02-20 DIAGNOSIS — M359 Systemic involvement of connective tissue, unspecified: Secondary | ICD-10-CM | POA: Insufficient documentation

## 2016-02-20 DIAGNOSIS — Z79899 Other long term (current) drug therapy: Secondary | ICD-10-CM | POA: Insufficient documentation

## 2016-02-20 DIAGNOSIS — M3500 Sicca syndrome, unspecified: Secondary | ICD-10-CM | POA: Insufficient documentation

## 2016-02-20 NOTE — Progress Notes (Signed)
Office Visit Note  Patient: Brianna Ross             Date of Birth: Jan 12, 1955           MRN: [id number]             PCP: Kelton Pillar, MD Referring: Lanice Shirts, * Visit Date: 02/28/2016 Occupation: RN    Subjective:  Depression   History of Present Illness: Brianna Ross is a 61 y.o. female with history of autoimmune disease, sicca symptoms and osteoarthritis. According to her she is doing fairly well as regards to autoimmune disease. She denies any joint pain or joint swelling or fatigue. Her sicca symptoms are tolerable. She's not having much discomfort in her hands. Her depression has been more pronounced lately. He is seeing her psychiatrist and change the dosage of her medications. She has not noticed any improvement so far.  Activities of Daily Living:  Patient reports morning stiffness for 0 minute.   Patient Denies nocturnal pain.  Difficulty dressing/grooming: Denies Difficulty climbing stairs: Denies Difficulty getting out of chair: Denies Difficulty using hands for taps, buttons, cutlery, and/or writing: Denies   Review of Systems  Constitutional: Negative for fatigue, night sweats, weight gain, weight loss and weakness.  HENT: Negative for mouth sores, trouble swallowing, trouble swallowing, mouth dryness and nose dryness.   Eyes: Negative for pain, redness, visual disturbance and dryness.  Respiratory: Negative for cough, shortness of breath and difficulty breathing.   Cardiovascular: Negative for chest pain, palpitations, hypertension, irregular heartbeat and swelling in legs/feet.  Gastrointestinal: Negative for blood in stool, constipation and diarrhea.  Endocrine: Negative for increased urination.  Genitourinary: Negative for vaginal dryness.  Musculoskeletal: Negative for arthralgias, joint pain, joint swelling, myalgias, muscle weakness, morning stiffness, muscle tenderness and myalgias.  Skin: Negative for color change, rash, hair loss, skin  tightness, ulcers and sensitivity to sunlight.  Allergic/Immunologic: Negative for susceptible to infections.  Neurological: Negative for dizziness, memory loss and night sweats.  Hematological: Negative for swollen glands.  Psychiatric/Behavioral: Positive for depressed mood and sleep disturbance. The patient is not nervous/anxious.     PMFS History:  Patient Active Problem List   Diagnosis Date Noted  . Primary osteoarthritis of both hands 02/27/2016  . High risk medication use 02/20/2016  . Autoimmune disease ([place]) 02/20/2016  . Sicca syndrome, unspecified ([place]) 02/20/2016  . History of sleep apnea 02/20/2016  . OSA on CPAP 08/11/2014  . Depression with somatization 08/11/2014  . Hearing loss 06/29/2014  . OSA treated with BiPAP 08/06/2013  . Hyperlipidemia 04/29/2013  . Family history of heart disease 12/10/2012  . 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 Positive ANA (antinuclear antibody) 03/06/2012  . Elevated aldolase level 03/06/2012  . Atypical nevi 11/14/2011  . Depression 11/13/2011  . Menopause 11/13/2011  . Rosacea 11/13/2011    Past Medical History:  Diagnosis Date  . Abnormal stress electrocardiogram test   . Allergy   . ANA positive   . Atypical nevi   . Depression   . Headache(784.0)   . Hives 2011   x5  . Hyperlipidemia   . Menopause   . OSA (obstructive sleep apnea)   . Right wrist fracture   . Rosacea   . Shingles 03/2012  . Shortness of breath   . Sleep apnea   . Urticaria     Family History  Problem Relation Age of Onset  . Glaucoma Mother   . Hyperlipidemia Mother   . Depression Mother   .  Osteoporosis Mother   . Heart disease Father   . Hyperlipidemia Father   . Hypertension Father   . Heart defect Sister   . Heart disease Sister 13    congenital heart defect,deceased  . Hyperlipidemia Brother   . Depression Brother   . High Cholesterol Brother   . Cancer Paternal Grandmother   . Stroke Paternal Grandfather   . Heart  disease Paternal Grandfather    Past Surgical History:  Procedure Laterality Date  . ORIF WRIST FRACTURE    . thymic cyst     removal  . WISDOM TOOTH EXTRACTION     Social History   Social History Narrative   Patient works for [employer].(full-time_    Patient lives at home with her husband Brianna Ross).   Patient does not have any children.   Patient is right-handed.   Patient drinks one cup of coffee everyday and two cups of tea daily.     Objective: Vital Signs: BP (!) 133/95   Pulse 97   Resp 14   Ht '5\' 4"'$  (1.626 m)   Wt 143 lb (64.9 kg)   BMI 24.55 kg/m    Physical Exam  Constitutional: She is oriented to person, place, and time. She appears well-developed and well-nourished.  HENT:  Head: Normocephalic and atraumatic.  Eyes: Conjunctivae and EOM are normal.  Neck: Normal range of motion.  Cardiovascular: Normal rate, regular rhythm, normal heart sounds and intact distal pulses.   Pulmonary/Chest: Effort normal and breath sounds normal.  Abdominal: Soft. Bowel sounds are normal.  Lymphadenopathy:    She has no cervical adenopathy.  Neurological: She is alert and oriented to person, place, and time.  Skin: Skin is warm and dry. Capillary refill takes less than 2 seconds.  Psychiatric: She has a normal mood and affect. Her behavior is normal.  Nursing note and vitals reviewed.    Musculoskeletal Exam: C-spine, thoracic, lumbar spine good range of motion she has some limitation in range of motion of her C-spine. Shoulder joints although joints wrist joint MCPs PIPs DIPs with good range of motion with no synovitis. Hip joints knee joints ankles MTPs PIPs DIPs with good range of motion with no synovitis.  CDAI Exam: No CDAI exam completed.    Investigation: Findings:  February 2014:  ANA was 1:160 nucleus homogenous pattern.  CK was 54 and aldolase was 14.4.  ANA in November 2011 was 1:280.  December 2013 comprehensive metabolic panel showed ALT of  38.  TSH and CBC was normal.   May 2017; CBC was normal, comprehensive metabolic panel showed AST of 37, ALT of 79, UA was negative.  Her repeat labs in June showed normal LFTs.  She brought those labs today with her.    Eye exam June 2017 was normal.     No visits with results within 3 Month(s) from this visit.  Latest known visit with results is:  Lab on 11/07/2015  Component Date Value Ref Range Status  . WBC 11/07/2015 4.6  3.8 - 10.8 K/uL Final  . RBC 11/07/2015 4.60  3.80 - 5.10 MIL/uL Final  . Hemoglobin 11/07/2015 14.1  11.7 - 15.5 g/dL Final  . HCT 11/07/2015 41.8  35.0 - 45.0 % Final  . MCV 11/07/2015 90.9  80.0 - 100.0 fL Final  . MCH 11/07/2015 30.7  27.0 - 33.0 pg Final  . MCHC 11/07/2015 33.7  32.0 - 36.0 g/dL Final  . RDW 11/07/2015 13.3  11.0 - 15.0 % Final  .  Platelets 11/07/2015 254  140 - 400 K/uL Final  . MPV 11/07/2015 9.3  7.5 - 12.5 fL Final  . Neutro Abs 11/07/2015 2668  1,500 - 7,800 cells/uL Final  . Lymphs Abs 11/07/2015 1242  850 - 3,900 cells/uL Final  . Monocytes Absolute 11/07/2015 552  200 - 950 cells/uL Final  . Eosinophils Absolute 11/07/2015 138  15 - 500 cells/uL Final  . Basophils Absolute 11/07/2015 0  0 - 200 cells/uL Final  . Neutrophils Relative % 11/07/2015 58  % Final  . Lymphocytes Relative 11/07/2015 27  % Final  . Monocytes Relative 11/07/2015 12  % Final  . Eosinophils Relative 11/07/2015 3  % Final  . Basophils Relative 11/07/2015 0  % Final  . Smear Review 11/07/2015 Criteria for review not met   Final  . Sodium 11/07/2015 140  135 - 146 mmol/L Final  . Potassium 11/07/2015 4.0  3.5 - 5.3 mmol/L Final  . Chloride 11/07/2015 102  98 - 110 mmol/L Final  . CO2 11/07/2015 30  20 - 31 mmol/L Final  . Glucose, Bld 11/07/2015 79  65 - 99 mg/dL Final  . BUN 11/07/2015 10  7 - 25 mg/dL Final  . Creat 11/07/2015 0.72  0.50 - 1.05 mg/dL Final   Comment:   For patients > or = 60 years of age: The upper reference limit for Creatinine is  approximately 13% higher for people identified as African-American.     . Total Bilirubin 11/07/2015 0.5  0.2 - 1.2 mg/dL Final  . Alkaline Phosphatase 11/07/2015 51  33 - 130 U/L Final  . AST 11/07/2015 18  10 - 35 U/L Final  . ALT 11/07/2015 19  6 - 29 U/L Final  . Total Protein 11/07/2015 6.5  6.1 - 8.1 g/dL Final  . Albumin 11/07/2015 4.5  3.6 - 5.1 g/dL Final  . Calcium 11/07/2015 9.9  8.6 - 10.4 mg/dL Final  . GFR, Est African American 11/07/2015 >89  >=60 mL/min Final  . GFR, Est Non African American 11/07/2015 >89  >=60 mL/min Final  . Sed Rate 11/07/2015 1  0 - 30 mm/hr Final  . Beta-2 Glyco I IgG 11/07/2015 <9  <=20 [facility] Final  . Beta-2-Glycoprotein I IgM 11/07/2015 <9  <=20 [facility] Final  . Beta-2-Glycoprotein I IgA 11/07/2015 16  <=20 [facility] Final   Comment:   The Antiphospholipid Antibody Syndrome (APS) is a clinical-pathologic correlation that includes a clinical event (e.g. thrombosis, pregnancy loss, thrombocytopenia) and persistent positive Antiphospholipid Antibodies (IgM or IgG ACA >40 MPL/GPL, IgM or IgG anti-B2GPI antibodies, or a Lupus Anticoagulant). The IgA isotype has been implicated in smaller studies, but have not yet been incorporated into the APS criteria. International consensus guidelines suggest waiting at least 12 weeks before retesting to confirm antibody persistence. Reference J Thromb Haemost 2006: 4; 295   For more information on this test, go to http://education.[facility].com/faq/FAQ109   . Anticardiolipin IgA 11/07/2015 <11  APL Final   Comment:                                    Value      Interpretation                                 -------     --------------                               <  or = 11     Negative                                 12 - 20     Indeterminate                                 21 - 80     Low to Medium Positive                                    > 80     High Positive   . Anticardiolipin IgG 11/07/2015 <14  GPL  Final   Comment:                                    Value      Interpretation                                 -------     --------------                               < or = 14     Negative                                 15 - 20     Indeterminate                                 21 - 80     Low to Medium Positive                                    > 80     High Positive   . Anticardiolipin IgM 11/07/2015 <12  MPL Final   Comment:                                    Value      Interpretation                                 -------     --------------                               < or = 12     Negative                                 13 - 20     Indeterminate                                 21 - 80  Low to Medium Positive                                    > 80     High Positive The antiphospholipid antibody syndrome (APS) is a clinical-pathologic correlation that includes a clinical event (e.g. thrombosis, pregnancy loss, thrombocytopenia) and persistent positive antiphospholipid antibodies (IgM or IgG ACA >40 MPL/GPL, IgM or IgG anti-b2GPI antibodies or a lupus anticoagulant). The IgA isotype has been implicated in smaller studies, but have not yet been incorporated into the APS criteria. International consensus guidelines suggest waiting at least 12 weeks before retesting to confirm antibody persistence. Reference: J Thromb Haemost 2006: 4; 295   . Color, Urine 11/07/2015 YELLOW  YELLOW Final  . APPearance 11/07/2015 CLEAR  CLEAR Final  . Specific Gravity, Urine 11/07/2015 1.003  1.001 - 1.035 Final  . pH 11/07/2015 6.5  5.0 - 8.0 Final  . Glucose, UA 11/07/2015 NEGATIVE  NEGATIVE Final  . Bilirubin Urine 11/07/2015 NEGATIVE  NEGATIVE Final  . Ketones, ur 11/07/2015 NEGATIVE  NEGATIVE Final  . Hgb urine dipstick 11/07/2015 NEGATIVE  NEGATIVE Final  . Protein, ur 11/07/2015 NEGATIVE  NEGATIVE Final  . Nitrite 11/07/2015 NEGATIVE  NEGATIVE Final  . Leukocytes, UA 11/07/2015  NEGATIVE  NEGATIVE Final    Imaging: No results found.  Speciality Comments: No specialty comments available.    Procedures:  No procedures performed Allergies: Patient has no known allergies.   Assessment / Plan:     Visit Diagnoses: Autoimmune disease ([place]) -Patient appears to be doing well with no synovitis on examination she has no active disease. Her most recent labs done in November showed normal sedimentation rate and normal complements. Her anticardiolipin and beta-2 antibodies were negative as well. We had detailed discussion today I have advised her to decrease her Plaquenil 200 mg tablet every other day and aspirin to every other day as well. I'll check her autoimmune profile again in 6 months. Plan: Urinalysis, Routine w reflex microscopic  Positive ANA (antinuclear antibody) - Autoimmune disease with positive ANA, hypocomplementemia, positive beta 2 and positive anticardiolipin antibodies.   High risk medication use - plaquenil 200 mg by mouth daily, aspirin 81 mg by mouth daily - Plan: CBC with Differential/Platelet, COMPLETE METABOLIC PANEL WITH GFR in April 2018.  Sicca syndrome, unspecified ([place]): Better with over-the-counter medications  Primary osteoarthritis of both hands: Joint protection and muscle strengthening discussed.  Herpes zoster keratoconjunctivitis  Depression, unspecified depression type: Patient describes her depression is not improving despite increasing the dose. We had detailed discussion regarding regular exercise.  She is postmenopausal and she is uncertain if she ever had a bone density. I've advised her to discuss with her PCP to see if it's been done if not can be scheduled through her PCP.  Rosacea  OSA on CPAP  Mixed hyperlipidemia    Orders: Orders Placed This Encounter  Procedures  . CBC with Differential/Platelet  . COMPLETE METABOLIC PANEL WITH GFR  . Urinalysis, Routine w reflex microscopic   No orders of the defined types  were placed in this encounter.   Face-to-face time spent with patient was 30 minutes. 50% of time was spent in counseling and coordination of care.  Follow-Up Instructions: Return in about 5 months (around 07/27/2016) for Autoimmune disease.   Bo Merino, MD  Note - This record has been created using Editor, commissioning.  Chart creation errors have been sought, but  may not always  have been located. Such creation errors do not reflect on  the standard of medical care.

## 2016-02-27 DIAGNOSIS — M19042 Primary osteoarthritis, left hand: Secondary | ICD-10-CM | POA: Insufficient documentation

## 2016-02-27 DIAGNOSIS — M19041 Primary osteoarthritis, right hand: Secondary | ICD-10-CM | POA: Insufficient documentation

## 2016-02-28 ENCOUNTER — Ambulatory Visit (INDEPENDENT_AMBULATORY_CARE_PROVIDER_SITE_OTHER): Payer: BLUE CROSS/BLUE SHIELD | Admitting: Rheumatology

## 2016-02-28 ENCOUNTER — Encounter: Payer: Self-pay | Admitting: Rheumatology

## 2016-02-28 VITALS — BP 133/95 | HR 97 | Resp 14 | Ht 64.0 in | Wt 143.0 lb

## 2016-02-28 DIAGNOSIS — L719 Rosacea, unspecified: Secondary | ICD-10-CM

## 2016-02-28 DIAGNOSIS — F329 Major depressive disorder, single episode, unspecified: Secondary | ICD-10-CM

## 2016-02-28 DIAGNOSIS — R768 Other specified abnormal immunological findings in serum: Secondary | ICD-10-CM

## 2016-02-28 DIAGNOSIS — Z79899 Other long term (current) drug therapy: Secondary | ICD-10-CM

## 2016-02-28 DIAGNOSIS — F32A Depression, unspecified: Secondary | ICD-10-CM

## 2016-02-28 DIAGNOSIS — M19042 Primary osteoarthritis, left hand: Secondary | ICD-10-CM | POA: Diagnosis not present

## 2016-02-28 DIAGNOSIS — M19041 Primary osteoarthritis, right hand: Secondary | ICD-10-CM

## 2016-02-28 DIAGNOSIS — B0233 Zoster keratitis: Secondary | ICD-10-CM | POA: Diagnosis not present

## 2016-02-28 DIAGNOSIS — E782 Mixed hyperlipidemia: Secondary | ICD-10-CM

## 2016-02-28 DIAGNOSIS — M3500 Sicca syndrome, unspecified: Secondary | ICD-10-CM

## 2016-02-28 DIAGNOSIS — Z9989 Dependence on other enabling machines and devices: Secondary | ICD-10-CM

## 2016-02-28 DIAGNOSIS — M359 Systemic involvement of connective tissue, unspecified: Secondary | ICD-10-CM | POA: Diagnosis not present

## 2016-02-28 DIAGNOSIS — G4733 Obstructive sleep apnea (adult) (pediatric): Secondary | ICD-10-CM

## 2016-02-28 NOTE — Patient Instructions (Signed)
Labs due April 2018 Please, schedule DXA scan with PCP.

## 2016-03-17 ENCOUNTER — Other Ambulatory Visit: Payer: Self-pay | Admitting: Rheumatology

## 2016-03-19 NOTE — Telephone Encounter (Signed)
ok 

## 2016-03-19 NOTE — Telephone Encounter (Signed)
Last Visit: 02/28/16 Next Visit: 07/26/16 Labs: 11/07/2015 WNL PLQ Eye Exam: 05/2015 WNL  Okay to refill PLQ?

## 2016-04-02 DIAGNOSIS — F332 Major depressive disorder, recurrent severe without psychotic features: Secondary | ICD-10-CM | POA: Diagnosis not present

## 2016-04-03 DIAGNOSIS — F332 Major depressive disorder, recurrent severe without psychotic features: Secondary | ICD-10-CM | POA: Diagnosis not present

## 2016-04-04 DIAGNOSIS — F332 Major depressive disorder, recurrent severe without psychotic features: Secondary | ICD-10-CM | POA: Diagnosis not present

## 2016-04-05 DIAGNOSIS — F332 Major depressive disorder, recurrent severe without psychotic features: Secondary | ICD-10-CM | POA: Diagnosis not present

## 2016-04-17 DIAGNOSIS — F332 Major depressive disorder, recurrent severe without psychotic features: Secondary | ICD-10-CM | POA: Diagnosis not present

## 2016-04-19 DIAGNOSIS — F332 Major depressive disorder, recurrent severe without psychotic features: Secondary | ICD-10-CM | POA: Diagnosis not present

## 2016-04-24 ENCOUNTER — Telehealth: Payer: Self-pay | Admitting: Rheumatology

## 2016-04-24 DIAGNOSIS — F332 Major depressive disorder, recurrent severe without psychotic features: Secondary | ICD-10-CM | POA: Diagnosis not present

## 2016-04-24 NOTE — Telephone Encounter (Signed)
Patient is scheduled to come back 4-27 for repeat labs. Patient will be in the area on 4-25, and would like to know if she could have labs drawn them. Please advise.

## 2016-04-25 ENCOUNTER — Other Ambulatory Visit: Payer: Self-pay | Admitting: Radiology

## 2016-04-25 DIAGNOSIS — Z79899 Other long term (current) drug therapy: Secondary | ICD-10-CM

## 2016-04-25 DIAGNOSIS — M359 Systemic involvement of connective tissue, unspecified: Secondary | ICD-10-CM

## 2016-04-25 LAB — CBC WITH DIFFERENTIAL/PLATELET
BASOS ABS: 0 {cells}/uL (ref 0–200)
Basophils Relative: 0 %
EOS ABS: 0 {cells}/uL — AB (ref 15–500)
Eosinophils Relative: 0 %
HEMATOCRIT: 47 % — AB (ref 35.0–45.0)
HEMOGLOBIN: 16.2 g/dL — AB (ref 11.7–15.5)
LYMPHS ABS: 1209 {cells}/uL (ref 850–3900)
LYMPHS PCT: 13 %
MCH: 31.6 pg (ref 27.0–33.0)
MCHC: 34.5 g/dL (ref 32.0–36.0)
MCV: 91.6 fL (ref 80.0–100.0)
MONO ABS: 744 {cells}/uL (ref 200–950)
MPV: 9.7 fL (ref 7.5–12.5)
Monocytes Relative: 8 %
NEUTROS PCT: 79 %
Neutro Abs: 7347 cells/uL (ref 1500–7800)
Platelets: 322 10*3/uL (ref 140–400)
RBC: 5.13 MIL/uL — ABNORMAL HIGH (ref 3.80–5.10)
RDW: 13.6 % (ref 11.0–15.0)
WBC: 9.3 10*3/uL (ref 3.8–10.8)

## 2016-04-25 NOTE — Telephone Encounter (Signed)
Yes this is fine.

## 2016-04-26 ENCOUNTER — Telehealth: Payer: Self-pay | Admitting: Radiology

## 2016-04-26 DIAGNOSIS — F332 Major depressive disorder, recurrent severe without psychotic features: Secondary | ICD-10-CM | POA: Diagnosis not present

## 2016-04-26 LAB — COMPLETE METABOLIC PANEL WITH GFR
AG Ratio: 2.7 Ratio — ABNORMAL HIGH (ref 1.0–2.5)
ALBUMIN: 4.9 g/dL (ref 3.6–5.1)
ALT: 12 U/L (ref 6–29)
AST: 15 U/L (ref 10–35)
Alkaline Phosphatase: 48 U/L (ref 33–130)
BUN/Creatinine Ratio: 11 Ratio (ref 6–22)
BUN: 8 mg/dL (ref 7–25)
CALCIUM: 10.2 mg/dL (ref 8.6–10.4)
CHLORIDE: 99 mmol/L (ref 98–110)
CO2: 22 mmol/L (ref 20–31)
Creat: 0.73 mg/dL (ref 0.50–1.05)
GFR, Est African American: >89 mL/min (ref >=60)
GLOBULIN: 1.8 g/dL — AB (ref 1.9–3.7)
GLUCOSE: 96 mg/dL (ref 65–99)
POTASSIUM: 3.9 mmol/L (ref 3.5–5.3)
Sodium: 139 mmol/L (ref 135–146)
Total Bilirubin: 0.6 mg/dL (ref 0.2–1.2)
Total Protein: 6.7 g/dL (ref 6.1–8.1)

## 2016-04-26 LAB — URINALYSIS, ROUTINE W REFLEX MICROSCOPIC
BILIRUBIN URINE: NEGATIVE
GLUCOSE, UA: NEGATIVE
Hgb urine dipstick: NEGATIVE
Ketones, ur: NEGATIVE
LEUKOCYTES UA: NEGATIVE
Nitrite: NEGATIVE
PH: 6 (ref 5.0–8.0)
Protein, ur: NEGATIVE
SPECIFIC GRAVITY, URINE: 1.005 (ref 1.001–1.035)

## 2016-04-26 NOTE — Telephone Encounter (Signed)
-----   Message from Bo Merino, MD sent at 04/26/2016  3:04 PM EDT ----- Hb is high. Will monitor for now.

## 2016-04-26 NOTE — Telephone Encounter (Signed)
I have called patient to advise labs are normal except her hemoglobin is slightly elevated. Will discuss with her.

## 2016-04-26 NOTE — Progress Notes (Signed)
Hb is high. Will monitor for now.

## 2016-05-16 ENCOUNTER — Encounter (HOSPITAL_COMMUNITY): Payer: Self-pay | Admitting: Behavioral Health

## 2016-05-16 ENCOUNTER — Ambulatory Visit (HOSPITAL_COMMUNITY)
Admission: RE | Admit: 2016-05-16 | Discharge: 2016-05-16 | Disposition: A | Discharge status: Home / Self Care | Payer: BLUE CROSS/BLUE SHIELD | Attending: Psychiatry | Admitting: Psychiatry

## 2016-05-16 NOTE — BH Assessment (Signed)
Assessment Note  Brianna Ross is a 61 y.o. female who presented to [facility] as a voluntary walk-in with complaint of long-standing depressive symptoms.  Pt was accompanied by husband [name] [place] -- [phone]) and neighbor.  Pt, husband, and neighbor provided history.  Pt reported that she has experienced significant depression for several years, beginning at the time that she had to move her mother into a nursing home (approximately four years ago).  Pt reported that she has been treated with ECT and [place] for depression.  ECT was effective, but she did not want to continue the maintenance sessions because of associated memory loss.   Pt endorsed the following symptoms:  Persistent and unremitting despondency; insomnia (about 3 hours per night); poor appetite (has lost about 30 pounds over the last 2-4 years); feelings of worthlessness; feelings of guilt; anxiety.  Pt denied suicidal ideation and past suicide attempts, homicidal ideation, auditory/visual hallucination, substance use concerns, and self-injurious behavior.  Pt's husband and neighbor requested inpatient placement.  Pt was ambivalent about outcome of assessment.  Pt currently receives outpatient treatment with Deloria Lair, NP and Trey Paula, [facility].  Pt takes Abilify.  She lives with her husband.  Husband stated that he has a firearm, and Pryor Curia asked husband to make sure that weapon was removed or properly secured.  Pt's husband is recovering from bypass surgery.  During assessment, Pt presented as alert and oriented.  She had good eye contact and was cooperative in session.  Pt was dressed in street clothes and appeared appropriately groomed.  Pt's mood was depressed, and affect was mood-congruent.  Pt endorsed long-standing despondency and other depressive symptoms.  She denied suicidal ideation, homicidal ideation, hallucination, and substance use concerns.  Likewise, she denied self-injurious behavior.  Pt's speech was soft and slow.  Thought  processes were within normal range, and thought content was logical and goal-oriented.  Memory was intact.  Concentration was fair.  Impulse control, judgment, and insight was fair.    Consulted with Starleen Arms, NP.  Pt's symptoms are consistent with Major Depressive Disorder.  However, Pt does not meet criteria for inpatient hospitalization.  Recommended Pt seek intensive outpatient services through [facility].  Resources provided.   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 Abnormal stress electrocardiogram test   . Allergy   . ANA positive   . Atypical nevi   . Depression   . Headache(784.0)   . Hives 2011   x5  . Hyperlipidemia   . Menopause   . OSA (obstructive sleep apnea)   . Right wrist fracture   . Rosacea   . Shingles 03/2012  . Shortness of breath   . Sleep apnea   . Urticaria     Past Surgical History:  Procedure Laterality Date  . ORIF WRIST FRACTURE    . thymic cyst     removal  . WISDOM TOOTH EXTRACTION      Family History:  Family History  Problem Relation Age of Onset  . Glaucoma Mother   . Hyperlipidemia Mother   . Depression Mother   . Osteoporosis Mother   . Heart disease Father   . Hyperlipidemia Father   . Hypertension Father   . Heart defect Sister   . Heart disease Sister 13       congenital heart defect,deceased  . Hyperlipidemia Brother   . Depression Brother   . High Cholesterol Brother   .  Cancer Paternal Grandmother   . Stroke Paternal Grandfather   . Heart disease Paternal Grandfather     Social History:  reports that she has never smoked. She has never used smokeless tobacco. She reports that she drinks alcohol. She reports that she does not use drugs.  Additional Social History:     CIWA: CIWA-Ar BP: 121/83 Pulse Rate: (!) 121 COWS:    Allergies: No Known Allergies  Home Medications:  (Not in a hospital  admission)  OB/GYN Status:  No LMP recorded. Patient is postmenopausal.  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 : No Suicidal Intent: No Has patient had any suicidal intent within the past 6 months prior to admission? : No Is patient at risk for suicide?: No Suicidal Plan?: No Has patient had any suicidal plan within the past 6 months prior to admission? : Yes Access to Means: No What has been your use of drugs/alcohol within the last 12 months?: Denied Previous Attempts/Gestures: No Intentional Self Injurious Behavior: None Family Suicide History: No Recent stressful life event(s): Other (Comment) (long-standing stress of putting mother in home) Persecutory voices/beliefs?: No Depression: Yes Depression Symptoms: Despondent, Insomnia, Fatigue, Guilt, Feeling worthless/self pity, Loss of interest in usual pleasures Substance abuse history and/or treatment for substance abuse?: No Suicide prevention information given to non-admitted patients: Not applicable  Risk to  Others within the past 6 months Homicidal Ideation: No Does patient have any lifetime risk of violence toward others beyond the six months prior to admission? : No Thoughts of Harm to Others: No Current Homicidal Intent: No Current Homicidal Plan: No Access to Homicidal Means: No History of harm to others?: No Assessment of Violence: None Noted Does patient have access to weapons?: No Criminal Charges Pending?: No Does patient have a court date: No Is patient on probation?: No  Psychosis Hallucinations: None noted Delusions: None noted  Mental Status Report Appearance/Hygiene: Unremarkable, Other (Comment) (street clothes) Eye Contact: Good Motor Activity: Unremarkable Speech: Unremarkable Level of Consciousness: Alert Mood: Depressed Affect: Depressed Anxiety Level: None Thought Processes: Coherent, Relevant Judgement: Partial Orientation: Person, Place, Time, Situation Obsessive Compulsive Thoughts/Behaviors: None  Cognitive Functioning Concentration: Normal Memory: Recent Intact, Remote Intact IQ: Average Insight: Fair Impulse Control: Fair Appetite: Poor Weight Loss: 30 (Over several years) Sleep: Decreased Total Hours of Sleep: 3 Vegetative Symptoms: None  ADLScreening [facility] Assessment Services) Patient's cognitive ability adequate to safely complete daily activities?: Yes Patient able to express need for assistance with ADLs?: Yes Independently performs ADLs?: Yes (appropriate for developmental age)  Prior Inpatient Therapy Prior Inpatient Therapy: Yes Prior Therapy Facilty/Provider(s): [facility] Reason for Treatment: Depression  Prior Outpatient Therapy Prior Outpatient Therapy: Yes Prior Therapy Dates: Ongoing Prior Therapy Facilty/Provider(s): Rollene Fare  Mozingo, NP Reason for Treatment: Depression Does patient have an ACCT team?: No Does patient have Intensive In-House Services?  : No Does patient have [facility] services? : No Does patient have P4[facility]  services?: No  ADL Screening (condition at time of admission) Patient's cognitive ability adequate to safely complete daily activities?: Yes Is the patient deaf or have difficulty hearing?: No Does the patient have difficulty seeing, even when wearing glasses/contacts?: No Does the patient have difficulty concentrating, remembering, or making decisions?: No Patient able to express need for assistance with ADLs?: Yes Does the patient have difficulty dressing or bathing?: No Independently performs ADLs?: Yes (appropriate for developmental age) Does the patient have difficulty walking or climbing stairs?: No Weakness of Legs: None Weakness of Arms/Hands: None  Home Assistive Devices/Equipment Home Assistive Devices/Equipment: None  Therapy Consults (therapy consults require a physician order) PT Evaluation Needed: No OT Evalulation Needed: No SLP Evaluation Needed: No Abuse/Neglect Assessment (Assessment to be complete while patient is alone) Physical Abuse: Denies Verbal Abuse: Denies Sexual Abuse: Denies Exploitation of patient/patient's resources: Denies Self-Neglect: Denies Values / Beliefs Cultural Requests During Hospitalization: None Spiritual Requests During Hospitalization: None Consults Spiritual Care Consult Needed: No Social Work Consult Needed: No Regulatory affairs officer (For Healthcare) Does Patient Have a Medical Advance Directive?: No    Additional Information 1:1 In Past 12 Months?: No CIRT Risk: No Elopement Risk: No Does patient have medical clearance?: Yes     Disposition:  Disposition Initial Assessment Completed for this Encounter: Yes Disposition of Patient: Outpatient treatment (Per L. Romilda Garret, NP, Pt does not meet inpt criteria) Type of outpatient treatment: Adult  On Site Evaluation by:   Reviewed with Physician:    Laurena Slimmer Emalene Welte 05/16/2016 1:52 PM

## 2016-05-16 NOTE — H&P (Signed)
Behavioral Health Medical Screening Exam  Brianna Ross is an 61 y.o. female.  Total Time spent with patient: 30 minutes  Psychiatric Specialty Exam: Physical Exam  Constitutional: She is oriented to person, place, and time. She appears well-developed and well-nourished.  HENT:  Head: Normocephalic.  Right Ear: External ear normal.  Left Ear: External ear normal.  Cardiovascular: Normal rate.   Respiratory: Effort normal.  Musculoskeletal: Normal range of motion.  Neurological: She is alert and oriented to person, place, and time.  Skin: Skin is warm and dry.    ROS  BP 121/83 (BP Location: Right Arm)   Pulse (!) 121   Temp 98.4 F (36.9 C) (Oral)   Resp 18   SpO2 99%    General Appearance: Casual and Fairly Groomed  Eye Contact:  Good  Speech:  Clear and Coherent and Slow  Volume:  Decreased  Mood:  Depressed and Dysphoric  Affect:  Congruent, Depressed and Flat  Thought Process:  Coherent  Orientation:  Full (Time, Place, and Person)  Thought Content:  Logical  Suicidal Thoughts:  No  Homicidal Thoughts:  No  Memory:  Immediate;   Good Recent;   Good Remote;   Fair  Judgement:  Fair  Insight:  Fair  Psychomotor Activity:  Normal  Concentration: Concentration: Good and Attention Span: Good  Recall:  Good  Fund of Knowledge:Good  Language: Good  Akathisia:  No  Handed:  Right  AIMS (if indicated):     Assets:  Communication Skills Desire for Improvement Financial Resources/Insurance Housing Resilience Social Support Transportation  Sleep:       Musculoskeletal: Strength & Muscle Tone: within normal limits Gait & Station: normal Patient leans: N/A  BP 121/83 (BP Location: Right Arm)   Pulse (!) 121   Temp 98.4 F (36.9 C) (Oral)   Resp 18   SpO2 99%   Recommendations:  Based on my evaluation the patient does not appear to have an emergency medical condition.  Ethelene Hal, NP 05/16/2016, 1:38 PM

## 2016-06-07 ENCOUNTER — Ambulatory Visit (INDEPENDENT_AMBULATORY_CARE_PROVIDER_SITE_OTHER): Payer: BLUE CROSS/BLUE SHIELD | Admitting: Psychiatry

## 2016-06-07 ENCOUNTER — Encounter (HOSPITAL_COMMUNITY): Payer: Self-pay | Admitting: Psychiatry

## 2016-06-07 ENCOUNTER — Encounter (INDEPENDENT_AMBULATORY_CARE_PROVIDER_SITE_OTHER): Payer: Self-pay

## 2016-06-07 VITALS — BP 108/76 | HR 109 | Ht 64.0 in | Wt 121.0 lb

## 2016-06-07 DIAGNOSIS — F332 Major depressive disorder, recurrent severe without psychotic features: Secondary | ICD-10-CM

## 2016-06-07 DIAGNOSIS — Z818 Family history of other mental and behavioral disorders: Secondary | ICD-10-CM | POA: Diagnosis not present

## 2016-06-07 DIAGNOSIS — F061 Catatonic disorder due to known physiological condition: Secondary | ICD-10-CM

## 2016-06-07 MED ORDER — LORAZEPAM 1 MG PO TABS: 1.0000 mg | ORAL_TABLET | Freq: Three times a day (TID) | ORAL | 1 refills | Status: DC

## 2016-06-07 MED ORDER — VENLAFAXINE HCL ER 75 MG PO CP24: 75.0000 mg | ORAL_CAPSULE | Freq: Every day | ORAL | 1 refills | Status: DC

## 2016-06-07 MED ORDER — OLANZAPINE 5 MG PO TABS: ORAL_TABLET | ORAL | 1 refills | Status: DC

## 2016-06-07 NOTE — Patient Instructions (Addendum)
STOP Trintellix   CONTINUE Zyprexa 5 mg at night   START Effexor 75 mg XR, increase to 150 mg XR in 1 week   START Ativan 1 mg 3 times a day   Come back and see me in 1 week

## 2016-06-07 NOTE — Progress Notes (Signed)
Psychiatric Initial Adult Assessment   Patient Identification: Brianna Ross MRN:  [id number] Date of Evaluation:  06/07/2016 Referral Source: PCP, Dr. Coralyn Ross Chief Complaint:  depression Visit Diagnosis:    ICD-10-CM   1. 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 Severe episode of recurrent major depressive disorder, without psychotic features (HCC) F33.2 venlafaxine XR (EFFEXOR XR) 75 MG 24 hr capsule    OLANZapine (ZYPREXA) 5 MG tablet    LORazepam (ATIVAN) 1 MG tablet   History of Present Illness:  Brianna Ross is a 61 year old female with a history of autoimmune illness unspecified, obstructive sleep apnea, hyperlipidemia, osteoarthritis, and severe recurrent depression who presents today for psychiatric intake assessment.  She is previously completed treatment with both ECT (completed 07/2015) and [place], without lasting improvement of her mood symptoms.  She presents as a referral from her primary care physician, for a change in mental health care. She has recently been receiving outpatient psychiatric treatment with nurse practitioner Brianna Ross.  She was previously treated by Dr. Letta Ross, and completed [place] with Dr. Caprice Ross in April 2018.  She reports that this was wholly ineffective.  The interaction with Brianna Ross was striking, in that she presented with fairly clear and classic signs of catatonia. She has significant speech latency, a reduction in blinking, a reduction in the speed of her gait, and stiff movements. She spoke with a soft tone, and made very poor eye contact. She was perseverating on "I want to feel better", and was not able to answer complex questions. She brought her friend with her, who is an Brianna Ross. Her friend reports that Brianna Ross will sit and stare for long periods of time at home, this friend has been watching her this week for the family. She reports that Brianna Ross will also pace for periods of time,  aimlessly.  She reports that Brianna Ross has continued to say the same type of sentences, such as "I want to feel better".  She reports that this has been going on for the past 2-3 months.  Brianna Ross reports that ECT was helpful for a couple months, but then she continued to worsen in terms of her depression.  She is able to provide some history that her depression has been lifelong, but in the past she has been able to be improved and in remission with Zoloft. She reports that the stress of caring for her mother over the past 2-1/2 years has taken quite a toll, and she has felt quite demoralized that she has not been able to work as an Brianna Ross. 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  She reports that the relationship with her mom is complicated by emotional neglect and a difficult childhood. She has no history of physical or sexual abuse.  She denies any alcohol or drug use. She denies any suicidality, and reports that she just "wants to feel better" and doesn't want to continue living like this with depression. She has no intention of harming herself.  She reports that she does not have the energy or motivation to shower, or take care of herself. She is visibly disheveled with unkempt hair.  On neuromuscular exam, Brianna Ross presents with reduced blinking, she has upper motor neuron release signs including bradykinesia and her upper extremities, hyperreflexive biceps tendon, the presence of a Hoffmans reflex and a grasp reflex. I had her nurse friend,  and also be able to observe some of these upper motor neuron release signs.    Notably, if I was not asking Brianna Ross a direct question, she would sit silently and quietly and not resent with any spontaneous speech for 1-2 minutes at a time. Then she would suddenly mention a phrase, or a simple sentence such as, I just want to feel better, or I hope this helps.  I spent time educating the patient and engaging and teach back with  her and her nurse friend. I discussed the diagnosis of catatonia, and that is most often seen in major depressive disorder of a high severity. I discussed my concern that her antidepressant is wholly inadequate, and I would like to start her on Effexor and escalate the dose fairly quickly. I also discussed that she does need to be on lorazepam 3 times daily, and we will start with 1 mg, and potentially increase to 2 mg. I taught her and her friend, who will be staying with her this week, what to expect with lorazepam, in terms of increased wakefulness, ability to do self-care, and increase in spontaneous speech.  Discussed the pathophysiology of catatonia, and the general trajectory with treatment.  Discussed that ECT is also an additional treatment for catatonia, and may potentially need to revisit this treatment in the future.  She has never had any treatment with Ativan or any long-term benzodiazepine use, so we agreed to start with Ativan.  We agreed to keep Zyprexa 5 mg at night.  Ideally I would like to start her on zolpidem as well given the benefits and catatonia and depression, but she was resistant to this.  Notably, I spent time educating them about the dysautonomia seen with catatonia. Her friend reports that they have been worried about her tachycardia, and at previous visits to the psychiatry office she has had significant hypertension.  Discussed the tachycardia is often seen with catatonia, and hypertension can be a sign of escalating symptoms. She is notably tachycardic and her visit today.  She agrees to follow-up with this writer in one week for a sooner follow-up visit.  I discussed the signs and symptoms of requiring potential psychiatric hospitalization, if she was not to have improvements with Ativan, and I expressed my frank recommendation that she seek a psychiatric hospitalization at [facility], if she can be safely taken there if necessary, as they would be expert in treating catatonia,  and had ECT treatment available on site.  Associated Signs/Symptoms: Depression Symptoms:  depressed mood, anhedonia, insomnia, psychomotor retardation, fatigue, difficulty concentrating, impaired memory, (Hypo) Manic Symptoms:  none Anxiety Symptoms:  none Psychotic Symptoms:  none PTSD Symptoms: Negative  Past Psychiatric History: No prior psychiatric hospitalizations. She has a history of outpatient psychiatric treatment, completed ECT in July 2017, with 2-3 months of improvement, and completed [place] in April 2018 with no improvement.  Previous Psychotropic Medications: Yes, zoloft, trintellix, pristiq, remeron  Substance Abuse History in the last 12 months:  No.  Consequences of Substance Abuse: Negative  Past Medical History:  Past Medical History:  Diagnosis Date  . Abnormal stress electrocardiogram test   . Allergy   . ANA positive   . Atypical nevi   . Depression   . Headache(784.0)   . Hives 2011   x5  . Hyperlipidemia   . Menopause   . OSA (obstructive sleep apnea)   . Right wrist fracture   . Rosacea   . Shingles 03/2012  . Shortness of breath   .  Sleep apnea   . Urticaria     Past Surgical History:  Procedure Laterality Date  . ORIF WRIST FRACTURE    . thymic cyst     removal  . WISDOM TOOTH EXTRACTION      Family Psychiatric History: Family history of depression in her brother  Family History:  Family History  Problem Relation Age of Onset  . Glaucoma Mother   . Hyperlipidemia Mother   . Depression Mother   . Osteoporosis Mother   . Heart disease Father   . Hyperlipidemia Father   . Hypertension Father   . Heart defect Sister   . Heart disease Sister 13       congenital heart defect,deceased  . Hyperlipidemia Brother   . Depression Brother   . High Cholesterol Brother   . Cancer Paternal Grandmother   . Stroke Paternal Grandfather   . Heart disease Paternal Grandfather     Social History:   Social History   Social History  .  Marital status: Married    Spouse name: Brianna Ross  . Number of children: 0  . Years of education: 26   Occupational History  .  [employer]   Social History Main Topics  . Smoking status: Never Smoker  . Smokeless tobacco: Never Used  . Alcohol use No     Comment: rarely  . Drug use: No  . Sexual activity: Not Currently    Birth control/ protection: Post-menopausal   Other Topics Concern  . None   Social History Narrative   Patient works for [employer].(full-time_    Patient lives at home with her husband Brianna Ross).   Patient does not have any children.   Patient is right-handed.   Patient drinks one cup of coffee everyday and two cups of tea daily.    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 fluorometholone (FML) 0.1 % ophthalmic suspension Place 1 drop into the left eye daily.    . valACYclovir (VALTREX) 1000 MG tablet Take 1,000 mg by mouth.    Marland Kitchen acetaminophen (TYLENOL) 500 MG tablet Take 500 mg by mouth 2 (two) times daily as needed (pain).    Marland Kitchen aspirin EC 81 MG tablet Take 81 mg by mouth at bedtime.     . hydroxychloroquine (PLAQUENIL) 200 MG tablet TAKE 1 TABLET AT BEDTIME 90 tablet 1  . LORazepam (ATIVAN) 1 MG tablet Take 1 tablet (1 mg total) by mouth every 8 (eight) hours. Take 1 mg 3 times a day for catatonia symptoms 90 tablet 1  . OLANZapine (ZYPREXA) 5 MG tablet 5 mg qhs 90 tablet 1  .  venlafaxine XR (EFFEXOR XR) 75 MG 24 hr capsule Take 1 capsule (75 mg total) by mouth daily. 90 capsule 1   No current facility-administered medications for this visit.     Neurologic: Headache: Negative Seizure: Negative Paresthesias:Negative  Musculoskeletal: Strength & Muscle  Tone: within normal limits Gait & Station: normal Patient leans: N/A  Psychiatric Specialty Exam: Review of Systems  Unable to perform ROS: Mental status change    Blood pressure 108/76, pulse (!) 109, height 5' 4" (1.626 m), weight 121 lb (54.9 kg).Body mass index is 20.77 kg/m.  General Appearance: Casual and Fairly Groomed  Eye Contact:  Poor  Speech:  Slow  Volume:  Decreased  Mood:  Depressed  Affect:  Flat  Thought Process:  Concrete, perseverative   Orientation:  Full (Time, Place, and Person)  Thought Content:  concrete  Suicidal Thoughts:  No  Homicidal Thoughts:  No  Memory:  Immediate;   Poor  Judgement:  Impaired  Insight:  Shallow  Psychomotor Activity:  Psychomotor Retardation and slow gait, upper motor neuron release signs  Concentration:  Concentration: Poor  Recall:  NA  Fund of Knowledge:Fair  Language: Poor  Akathisia:  Negative  Handed:  Right  AIMS (if indicated):  0  Assets:  Desire for Improvement Financial Resources/Insurance Housing Intimacy Social Support Transportation Vocational/Educational  ADL's:  Intact, poor participation due to catatonia  Cognition: impaired 2/2 to catatonia  Sleep:  6 hours, poor quality    Treatment Plan Summary: Cheryll Keisler is a 61 year old female with a long-standing history of major depressive disorder. She has medical attribute errors of autoimmune illness.  She has previously failed ECT and [place] in the past year. She has had an acute worsening of her depression symptoms in the context of caring for her ill mother over the past 2 years.  The past several months have been met with a worsening of her depression and mental status.  She  presents today with symptoms of catatonia in the context of major depressive disorder.  I completed a Narda Rutherford rating scale and she presents with grasp reflex, gegenhalten, posturing, mutism, fixed gaze, dysautonomia of her pulse in office, some perseveration, negativism and withdrawal from by mouth intake.  She has been able to take enough fluid and food with family encouragement, but if she continues to have a worsening of her by mouth intake, or dysautonomia, we will seek inpatient psychiatric hospitalization.   I wonder about any autoimmune contributing factors, given her history of positive ANA and Sicca syndrome.  When we follow up next week we will consider some laboratory studies, if she is not having an adequate response to lorazepam.  Her Narda Rutherford scale today is 26.  I spent time with the patient and her family friend who is a Marine scientist, educating them on catatonia, and if or when to seek psychiatric hospitalization. If the patient does require psychiatric hospitalization, I recommended that they drive to [facility] for a psychiatric ER consult, if this can be safely managed.  We will proceed as below and I will follow up with her in office in 5 days.  1. Catatonia   2. Severe episode of recurrent major depressive disorder, without psychotic features (HCC)    Discontinued Trintellix 10 mg Initiate Effexor 75 mg XR 1 week, then increase to 150 mg XR Initiate Ativan 1 mg 3 times daily for catatonia, and patient is welcome to increase to 2 mg 3 times daily if needed Continue Zyprexa 5 mg nightly for augmentation of antidepressant, I'd like to see her off of this in the coming month as we work our way towards max dose Effexor  If she has trouble sleeping, I suggest we start Zolpidem 5 mg nightly as this will also help with catatonia We  may need to revisit the possibility of ECT Pending her response to Ativan, we will consider any autoimmune work up if needed I will follow up with the patient  on Tuesday in office ( in 5 days)  Aundra Dubin, MD 6/7/20189:58 AM

## 2016-06-12 ENCOUNTER — Ambulatory Visit (INDEPENDENT_AMBULATORY_CARE_PROVIDER_SITE_OTHER): Payer: BLUE CROSS/BLUE SHIELD | Admitting: Psychiatry

## 2016-06-12 ENCOUNTER — Encounter (HOSPITAL_COMMUNITY): Payer: Self-pay | Admitting: Psychiatry

## 2016-06-12 VITALS — BP 118/70 | HR 105 | Ht 64.0 in | Wt 125.8 lb

## 2016-06-12 DIAGNOSIS — F332 Major depressive disorder, recurrent severe without psychotic features: Secondary | ICD-10-CM | POA: Diagnosis not present

## 2016-06-12 DIAGNOSIS — F061 Catatonic disorder due to known physiological condition: Secondary | ICD-10-CM | POA: Diagnosis not present

## 2016-06-12 MED ORDER — LORAZEPAM 1 MG PO TABS: 2.0000 mg | ORAL_TABLET | Freq: Three times a day (TID) | ORAL | 1 refills | Status: DC

## 2016-06-12 NOTE — Progress Notes (Signed)
BH MD/PA/NP OP Progress Note  06/12/2016 12:39 PM Brianna Ross  MRN:  [id number]  Chief Complaint: med check Subjective: Brianna Ross presents today for a follow-up after we recently started Ativan for catatonia. Her friend, who is an Therapist, sports, is with her here for the visit.  There has been an increase in her speech, she is taking in more by mouth food and fluids, and she has less speech latency, decreased time between blinking.  She still continues to be quite depressed and sit silently for periods of time, but Ativan has definitely had an activating effect for her.  She continues to be quite depressed, and understands that we are titrating Effexor to 150 mg this week.  She denies any suicidality or unsafe thoughts.  We discussed increasing Ativan to 2 mg 3 times a day, and discussed that if this was to be excessively sedating, she can use 1-1/2 mg instead.  Reiterated signs and symptoms of needing hospitalization.    Discussing with her friend, who is present today, she reports that the patient was immediately more awake and alert, and more talkative about 1 hour after starting Ativan last week. She reports that they have continued to see some positive activating benefits.    Visit Diagnosis:    ICD-10-CM   1. 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 Severe episode of recurrent major depressive disorder, without psychotic features (HCC) F33.2 LORazepam (ATIVAN) 1 MG tablet    CRP High sensitivity    Sed Rate (ESR)    Vitamin D 1,25 dihydroxy    Past Psychiatric History: See intake H&P for full details. Reviewed, with no updates at this time.   Past Medical History:  Past Medical History:  Diagnosis Date  . Abnormal stress electrocardiogram test   . Allergy   . ANA positive   . Atypical nevi   . Depression   . Headache(784.0)   . Hives 2011   x5  . Hyperlipidemia   . Menopause   . OSA (obstructive sleep  apnea)   . Right wrist fracture   . Rosacea   . Shingles 03/2012  . Shortness of breath   . Sleep apnea   . Urticaria     Past Surgical History:  Procedure Laterality Date  . ORIF WRIST FRACTURE    . thymic cyst     removal  . WISDOM TOOTH EXTRACTION      Family Psychiatric History: See intake H&P for full details. Reviewed, with no updates at this time.   Family History:  Family History  Problem Relation Age of Onset  . Glaucoma Mother   . Hyperlipidemia Mother   . Depression Mother   . Osteoporosis Mother   . Heart disease Father   . Hyperlipidemia Father   . Hypertension Father   . Heart defect Sister   . Heart disease Sister 13       congenital heart defect,deceased  . Hyperlipidemia Brother   . Depression Brother   . High Cholesterol Brother   . Cancer Paternal Grandmother   . Stroke Paternal Grandfather   . Heart disease Paternal Grandfather     Social History:  Social History   Social History  . Marital status: Married    Spouse name: Sonny  . Number of children: 0  . Years of education: 74   Occupational History  .  [employer]  Social History Main Topics  . Smoking status: Never Smoker  . Smokeless tobacco: Never Used  . Alcohol use No     Comment: rarely  . Drug use: No  . Sexual activity: Not Currently    Birth control/ protection: Post-menopausal   Other Topics Concern  . None   Social History Narrative   Patient works for [employer].(full-time_    Patient lives at home with her husband [name]).   Patient does not have any children.   Patient is right-handed.   Patient drinks one cup of coffee everyday and two cups of tea daily.    Allergies: No Known Allergies  Metabolic Disorder Labs: No results found for: HGBA1C, MPG No results found for: PROLACTIN Lab Results  Component Value Date   CHOL 188 11/13/2013   TRIG 82 11/13/2013   HDL 68 11/13/2013   CHOLHDL 2.8 11/13/2013   VLDL 16  11/13/2013   LDLCALC 104 (H) 11/13/2013   LDLCALC 129 (H) 11/03/2012     Current Medications: Current Outpatient Prescriptions  Medication Sig Dispense Refill  . acetaminophen (TYLENOL) 500 MG tablet Take 500 mg by mouth 2 (two) times daily as needed (pain).    Marland Kitchen aspirin EC 81 MG tablet Take 81 mg by mouth at bedtime.     . fluorometholone (FML) 0.1 % ophthalmic suspension Place 1 drop into the left eye daily.    . hydroxychloroquine (PLAQUENIL) 200 MG tablet TAKE 1 TABLET AT BEDTIME 90 tablet 1  . LORazepam (ATIVAN) 1 MG tablet Take 2 tablets (2 mg total) by mouth every 8 (eight) hours. Take 1 mg 3 times a day for catatonia symptoms 90 tablet 1  . OLANZapine (ZYPREXA) 5 MG tablet 5 mg qhs 90 tablet 1  . valACYclovir (VALTREX) 1000 MG tablet Take 1,000 mg by mouth.    . venlafaxine XR (EFFEXOR XR) 75 MG 24 hr capsule Take 1 capsule (75 mg total) by mouth daily. 90 capsule 1   No current facility-administered medications for this visit.     Neurologic: Headache: Negative Seizure: Negative Paresthesias: Negative  Musculoskeletal: Strength & Muscle Tone: within normal limits Gait & Station: normal Patient leans: N/A  Psychiatric Specialty Exam: ROS  Blood pressure 118/70, pulse (!) 105, height 5' 4" (1.626 m), weight 125 lb 12.8 oz (57.1 kg).Body mass index is 21.59 kg/m.  General Appearance: Casual and Fairly Groomed  Eye Contact:  Fair  Speech:  Slow  Volume:  Decreased  Mood:  Hopeless  Affect:  Flat  Thought Process:  Goal Directed  Orientation:  Full (Time, Place, and Person)  Thought Content: Logical   Suicidal Thoughts:  No  Homicidal Thoughts:  No  Memory:  Immediate;   Fair  Judgement:  Intact  Insight:  Fair and Present  Psychomotor Activity:  Psychomotor Retardation  Concentration:  Concentration: Fair  Recall:  [place] of Knowledge: NA  Language: Fair  Akathisia:  Negative  Handed:  Right  AIMS (if indicated):  0  Assets:  Communication  Skills Desire for Improvement Financial Resources/Insurance Housing Intimacy Leisure Time Physical Health Resilience Social Support Talents/Skills Transportation Vocational/Educational  ADL's:  Intact  Cognition: WNL  Sleep:  6-7 hours    Treatment Plan Summary: Brianna Ross with severe major depressive disorder, presents today for follow-up of catatonia symptoms.  She continues to present with staring, sitting in mobile for periods of time, some slight speech latency, and flattened affect. She has definitely had some activating effects from Ativan, and  has increased her by mouth intake over the past 5-6 days, gaining some much needed weight. I believe she would benefit from a further increase Ativan to 2 mg 3 times a day.  We are also titrating her Effexor to 150 mg this week. She remains with some tachycardia, secondary to catatonia. I will see her back here in office in 2 weeks, or sooner if needed.  She is very opposed to hospitalization, but we reiterated what the signs and symptoms of worsening catatonia might be, and she has excellent social support to help her get there, if hospitalization is needed.    1. Catatonia   2. Severe episode of recurrent major depressive disorder, without psychotic features ([place])    Increase Ativan to 2 mg 3 times daily; if patient continues to have catatonia symptoms, okay to increase to 3 mg TID Effexor increased to 150 mg XR daily Continue olanzapine 5 mg nightly Follow-up in 2 weeks or sooner if needed We will consider ECT if needed Labs as below  Orders Placed This Encounter  Procedures  . CRP High sensitivity  . Sed Rate (ESR)  . Vitamin D 1,25 dihydroxy     Aundra Dubin, MD 06/12/2016, 12:39 PM

## 2016-06-12 NOTE — Patient Instructions (Signed)
CONTINUE Zyprexa at 5 mg nightly  INCREASE Ativan to 2 mg three times a day (around 8 AM, lunchtime, and dinner)  INCREASE Effexor to 150 mg XR daily on Thursday morning  Come back to see me on 06/25/16 at 12 noon

## 2016-06-13 ENCOUNTER — Other Ambulatory Visit (HOSPITAL_COMMUNITY): Payer: Self-pay | Admitting: Psychiatry

## 2016-06-18 LAB — VITAMIN D 1,25 DIHYDROXY
VITAMIN D3 1, 25 (OH): 50 pg/mL
Vitamin D 1, 25 (OH)2 Total: 50 pg/mL
Vitamin D2 1, 25 (OH)2: <10 pg/mL

## 2016-06-18 LAB — SEDIMENTATION RATE: Sed Rate: 2 mm/hr (ref 0–40)

## 2016-06-18 LAB — HIGH SENSITIVITY CRP: CRP HIGH SENSITIVITY: 0.46 mg/L (ref 0.00–3.00)

## 2016-06-25 ENCOUNTER — Ambulatory Visit (INDEPENDENT_AMBULATORY_CARE_PROVIDER_SITE_OTHER): Payer: BLUE CROSS/BLUE SHIELD | Admitting: Psychiatry

## 2016-06-25 DIAGNOSIS — F332 Major depressive disorder, recurrent severe without psychotic features: Secondary | ICD-10-CM | POA: Diagnosis not present

## 2016-06-25 DIAGNOSIS — Z818 Family history of other mental and behavioral disorders: Secondary | ICD-10-CM | POA: Diagnosis not present

## 2016-06-25 DIAGNOSIS — F061 Catatonic disorder due to known physiological condition: Secondary | ICD-10-CM | POA: Diagnosis not present

## 2016-06-25 MED ORDER — VENLAFAXINE HCL ER 75 MG PO CP24: 225.0000 mg | ORAL_CAPSULE | Freq: Every day | ORAL | 0 refills | Status: DC

## 2016-06-25 NOTE — Progress Notes (Signed)
BH MD/PA/NP OP Progress Note  06/25/2016 12:17 PM Brianna Ross  MRN:  [id number]  Chief Complaint: med check Subjective: Brianna Ross presents today for follow-up med management for catatonia and depression. She presents with her friend who is a Marine scientist. The patient has noted increase in spontaneous speech, she is smiling more. She shares that she was able to go to a birthday party this weekend with her husband. She reports that she is eating more, and reports that she is showering more often. Her friend reports that Brianna Ross does talk more spontaneously, without people directly asking her questions.  She has not had any suicidal thoughts or unsafe thoughts. No psychotic symptoms. She has received positive feedback from her husband.  She continues to struggle with apathy, anhedonia, and psychomotor slowing throughout the day. Unless she has activities arranged by her husband or by her friend, she will be perfectly happy to sit quietly and watch the golf channel. This is again very different than her baseline where she was quite active last year. She does blink more often. She does not stare as often.  Discussed increasing Effexor to 225 mg daily, and continuing her other medications as they are. She is taking Ativan 1 mg twice a day, in addition to 2 mg at night. She is sleeping very well, and reports that this is a welcomed improvement.  Visit Diagnosis:    ICD-10-CM   1. 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 Severe episode of recurrent major depressive disorder, without psychotic features (HCC) F33.2 venlafaxine XR (EFFEXOR XR) 75 MG 24 hr capsule    Past Psychiatric History: See intake H&P for full details. Reviewed, with no updates at this time.   Past Medical History:  Past Medical History:  Diagnosis Date  . Abnormal stress electrocardiogram test   . Allergy   . ANA positive   . Atypical nevi   . Depression   . Headache(784.0)   . Hives 2011   x5  .  Hyperlipidemia   . Menopause   . OSA (obstructive sleep apnea)   . Right wrist fracture   . Rosacea   . Shingles 03/2012  . Shortness of breath   . Sleep apnea   . Urticaria     Past Surgical History:  Procedure Laterality Date  . ORIF WRIST FRACTURE    . thymic cyst     removal  . WISDOM TOOTH EXTRACTION      Family Psychiatric History: See intake H&P for full details. Reviewed, with no updates at this time.   Family History:  Family History  Problem Relation Age of Onset  . Glaucoma Mother   . Hyperlipidemia Mother   . Depression Mother   . Osteoporosis Mother   . Heart disease Father   . Hyperlipidemia Father   . Hypertension Father   . Heart defect Sister   . Heart disease Sister 13       congenital heart defect,deceased  . Hyperlipidemia Brother   . Depression Brother   . High Cholesterol Brother   . Cancer Paternal Grandmother   . Stroke Paternal Grandfather   . Heart disease Paternal Grandfather     Social History:  Social History   Social History  . Marital status: Married    Spouse name: Sonny  . Number of children: 0  . Years of education: 59   Occupational History  .  [employer]   Social History Main Topics  .  Smoking status: Never Smoker  . Smokeless tobacco: Never Used  . Alcohol use No     Comment: rarely  . Drug use: No  . Sexual activity: Not Currently    Birth control/ protection: Post-menopausal   Other Topics Concern  . Not on file   Social History Narrative   Patient works for [employer].(full-time_    Patient lives at home with her husband Brianna Ross).   Patient does not have any children.   Patient is right-handed.   Patient drinks one cup of coffee everyday and two cups of tea daily.    Allergies: No Known Allergies  Metabolic Disorder Labs: No results found for: HGBA1C, MPG No results found for: PROLACTIN Lab Results  Component Value Date   CHOL 188 11/13/2013   TRIG 82  11/13/2013   HDL 68 11/13/2013   CHOLHDL 2.8 11/13/2013   VLDL 16 11/13/2013   LDLCALC 104 (H) 11/13/2013   LDLCALC 129 (H) 11/03/2012     Current Medications: Current Outpatient Prescriptions  Medication Sig Dispense Refill  . acetaminophen (TYLENOL) 500 MG tablet Take 500 mg by mouth 2 (two) times daily as needed (pain).    Marland Kitchen aspirin EC 81 MG tablet Take 81 mg by mouth at bedtime.     . fluorometholone (FML) 0.1 % ophthalmic suspension Place 1 drop into the left eye daily.    . hydroxychloroquine (PLAQUENIL) 200 MG tablet TAKE 1 TABLET AT BEDTIME 90 tablet 1  . LORazepam (ATIVAN) 1 MG tablet Take 2 tablets (2 mg total) by mouth every 8 (eight) hours. Take 1 mg 3 times a day for catatonia symptoms 90 tablet 1  . OLANZapine (ZYPREXA) 5 MG tablet 5 mg qhs 90 tablet 1  . valACYclovir (VALTREX) 1000 MG tablet Take 1,000 mg by mouth.    . venlafaxine XR (EFFEXOR XR) 75 MG 24 hr capsule Take 3 capsules (225 mg total) by mouth daily. 270 capsule 0   No current facility-administered medications for this visit.     Neurologic: Headache: Negative Seizure: Negative Paresthesias: Negative  Musculoskeletal: Strength & Muscle Tone: within normal limits Gait & Station: normal Patient leans: N/A  Psychiatric Specialty Exam: ROS  There were no vitals taken for this visit.There is no height or weight on file to calculate BMI.  General Appearance: Casual and Fairly Groomed  Eye Contact:  Good  Speech:  Slow but improving  Volume:  Decreased improving volume  Mood:  Depressed improving  Affect:  Flat  Thought Process:  Goal Directed  Orientation:  Full (Time, Place, and Person)  Thought Content: Logical   Suicidal Thoughts:  No  Homicidal Thoughts:  No  Memory:  Immediate;   Fair  Judgement:  Intact  Insight:  Fair and Present  Psychomotor Activity:  Psychomotor Retardation  Concentration:  Concentration: Fair  Recall:  [place] of Knowledge: NA  Language: Fair  Akathisia:   Negative  Handed:  Right  AIMS (if indicated):  0  Assets:  Communication Skills Desire for Improvement Financial Resources/Insurance Housing Intimacy Leisure Time Physical Health Resilience Social Support Talents/Skills Transportation Vocational/Educational  ADL's:  Intact  Cognition: WNL  Sleep:  8 hours    Treatment Plan Summary: Brianna Ross with severe major depressive disorder With catatonia.  She has had a positive response to lorazepam, now taking 4 mg in a day. We are titrating Effexor as below, and she will follow up closely with writer as we work to get her symptoms under control. She  does not present with any autonomic instability, or any acute safety issues requiring psychiatric hospitalization.  1. Catatonia   2. Severe episode of recurrent major depressive disorder, without psychotic features (HCC)    Continue ativan 1 mg BID and 2 mg QHS Effexor increased to 225 mg XR daily Continue olanzapine 5 mg nightly Follow-up in 2-3 weeks or sooner if needed We will consider ECT if needed; patient wishes to avoid this if possible Labs reviewed and normal   Aundra Dubin, MD 06/25/2016, 12:17 PM

## 2016-06-25 NOTE — Patient Instructions (Addendum)
Increase effexor to 3 tablets (225 mg) daily  Continue ativan 1 mg in AM, 1 mg at noon, and 2 mg at night  Continue Olanzapine at night

## 2016-06-27 ENCOUNTER — Encounter (HOSPITAL_COMMUNITY): Payer: Self-pay | Admitting: Psychiatry

## 2016-06-27 ENCOUNTER — Ambulatory Visit: Payer: BLUE CROSS/BLUE SHIELD | Admitting: Psychology

## 2016-06-27 ENCOUNTER — Telehealth (HOSPITAL_COMMUNITY): Payer: Self-pay

## 2016-06-27 NOTE — Telephone Encounter (Signed)
I spoke with Brianna Ross and updated her on recommendations, things to watch for for hospitalization.  I also spoke directly with Brianna Ross and she is certainly in shock, but is able to converse. She denies any thoughts to want to die or harm herself, she shares that she has a lot of family support and is staying with her brother.  There are no guns in the home.  Her brother works at home so she has someone there 24/7.  Discussed that she can take ativan 2 mg TID, and if she feeels her mood sliding back, feels unsafe, or her heart rate/blood pressure go up (they will measure daily at home) then she should come to the ER.  I offered for her to come in at lunchtime anytime this week or next week if she feels like she needs to.  She said she wants to be with her family but will call us if she needs to come in at lunch to check in.

## 2016-06-27 NOTE — Telephone Encounter (Signed)
Patients friend called today and wants you to know that patients husband shot and killed himself this morning. Patient will be staying with her brother for now. She is not responding to anyone, her friend believes that she is most likely in shock right now. She would like a call back, her name is Otis Peak and the nimber is [phone]

## 2016-07-03 ENCOUNTER — Ambulatory Visit (INDEPENDENT_AMBULATORY_CARE_PROVIDER_SITE_OTHER): Payer: BLUE CROSS/BLUE SHIELD | Admitting: Psychology

## 2016-07-03 DIAGNOSIS — F332 Major depressive disorder, recurrent severe without psychotic features: Secondary | ICD-10-CM | POA: Diagnosis not present

## 2016-07-10 ENCOUNTER — Ambulatory Visit (INDEPENDENT_AMBULATORY_CARE_PROVIDER_SITE_OTHER): Payer: BLUE CROSS/BLUE SHIELD | Admitting: Psychology

## 2016-07-10 DIAGNOSIS — F332 Major depressive disorder, recurrent severe without psychotic features: Secondary | ICD-10-CM | POA: Diagnosis not present

## 2016-07-13 ENCOUNTER — Ambulatory Visit (INDEPENDENT_AMBULATORY_CARE_PROVIDER_SITE_OTHER): Payer: BLUE CROSS/BLUE SHIELD | Admitting: Psychiatry

## 2016-07-13 ENCOUNTER — Encounter (HOSPITAL_COMMUNITY): Payer: Self-pay | Admitting: Psychiatry

## 2016-07-13 DIAGNOSIS — F332 Major depressive disorder, recurrent severe without psychotic features: Secondary | ICD-10-CM

## 2016-07-13 DIAGNOSIS — Z818 Family history of other mental and behavioral disorders: Secondary | ICD-10-CM

## 2016-07-13 DIAGNOSIS — F061 Catatonic disorder due to known physiological condition: Secondary | ICD-10-CM | POA: Diagnosis not present

## 2016-07-13 MED ORDER — VENLAFAXINE HCL ER 150 MG PO CP24: 300.0000 mg | ORAL_CAPSULE | Freq: Every day | ORAL | 1 refills | Status: DC

## 2016-07-13 NOTE — Progress Notes (Signed)
BH MD/PA/NP OP Progress Note  07/13/2016 10:36 AM Ardean Simonich  MRN:  [id number]  Chief Complaint: med check, follow-up  Subjective:  Easton Sivertson is a 61 year old female who presents today for med management follow-up.Marland Kitchen Spent time with the patient discussing her grief related to her husband's suicide. The patient continues to be quite stunned, but feels that her depression has still managed to improve. She feels grateful for significant family support. I spent time with the patient sharing my grief and sadness for her loss, and learning more about her husband [name], and the type of man he was, and about their long-term relationship which was filled with support and love.    The patient's friend, Al Corpus, who is a Marine scientist, is present today as well. She reports that pt has been staying with her brother, and Graciella Belton usually spends the day with pt taking care of some of the paperwork and odds and ends related to her husbands death.  She has also noted that Mrs. Abril has remained quite strong and resilient, and despite this tragedy, she has continued to improve in her depressive symptoms. She is eating well, she has increased her showers and self-care. She smiles more often in response to humor. More spontaneous speech and speaks more quickly which is near her baseline.    The patient reports that she has no thoughts to end her life. She has faith that her husband is in heaven, and she is sad that he is not with her. The guns and firearms have been removed from the home by the police department.  She is likely going to put the home up for rent or put the home for sale given that it's difficult for her to be there.    She continues on Ativan 2 mg 3 times a day.  We discussed increasing Effexor to 300 mg for max dose, and she was agreeable to this. She continues on Zyprexa 5 mg nightly.  Discussed that I'd like her to come back in a few weeks so that we can think about tapering Zyprexa. Discussed that also she can  start to taper Ativan if she notices particular times of the day that she needs 1 mg instead of 2 mg.  She was receptive to this, and is hopeful to get off of Ativan soon.  Visit Diagnosis:    ICD-10-CM   1. 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 Severe episode of recurrent major depressive disorder, without psychotic features ([place]) F33.2 venlafaxine XR (EFFEXOR-XR) 150 MG 24 hr capsule    Past Psychiatric History: See intake H&P for full details. Reviewed, with no updates at this time.   Past Medical History:  Past Medical History:  Diagnosis Date  . Abnormal stress electrocardiogram test   . Allergy   . ANA positive   . Atypical nevi   . Depression   . Headache(784.0)   . Hives 2011   x5  . Hyperlipidemia   . Menopause   . OSA (obstructive sleep apnea)   . Right wrist fracture   . Rosacea   . Shingles 03/2012  . Shortness of breath   . Sleep apnea   . Urticaria     Past Surgical History:  Procedure Laterality Date  . ORIF WRIST FRACTURE    . thymic cyst     removal  . WISDOM TOOTH EXTRACTION      Family Psychiatric History: See intake H&P for full details. Reviewed, with no updates at  this time.   Family History:  Family History  Problem Relation Age of Onset  . Glaucoma Mother   . Hyperlipidemia Mother   . Depression Mother   . Osteoporosis Mother   . Heart disease Father   . Hyperlipidemia Father   . Hypertension Father   . Heart defect Sister   . Heart disease Sister 13       congenital heart defect,deceased  . Hyperlipidemia Brother   . Depression Brother   . High Cholesterol Brother   . Cancer Paternal Grandmother   . Stroke Paternal Grandfather   . Heart disease Paternal Grandfather     Social History:  Social History   Social History  . Marital status: Married    Spouse name: Sonny  . Number of children: 0  . Years of education: 2   Occupational History  .  [employer]   Social  History Main Topics  . Smoking status: Never Smoker  . Smokeless tobacco: Never Used  . Alcohol use No     Comment: rarely  . Drug use: No  . Sexual activity: Not Currently    Birth control/ protection: Post-menopausal   Other Topics Concern  . None   Social History Narrative   Patient works for [employer].(full-time_    Patient lives at home with her husband [name]).   Patient does not have any children.   Patient is right-handed.   Patient drinks one cup of coffee everyday and two cups of tea daily.    Allergies: No Known Allergies  Metabolic Disorder Labs: No results found for: HGBA1C, MPG No results found for: PROLACTIN Lab Results  Component Value Date   CHOL 188 11/13/2013   TRIG 82 11/13/2013   HDL 68 11/13/2013   CHOLHDL 2.8 11/13/2013   VLDL 16 11/13/2013   LDLCALC 104 (H) 11/13/2013   LDLCALC 129 (H) 11/03/2012     Current Medications: Current Outpatient Prescriptions  Medication Sig Dispense Refill  . acetaminophen (TYLENOL) 500 MG tablet Take 500 mg by mouth 2 (two) times daily as needed (pain).    Marland Kitchen aspirin EC 81 MG tablet Take 81 mg by mouth at bedtime.     . fluorometholone (FML) 0.1 % ophthalmic suspension Place 1 drop into the left eye daily.    . hydroxychloroquine (PLAQUENIL) 200 MG tablet TAKE 1 TABLET AT BEDTIME 90 tablet 1  . LORazepam (ATIVAN) 1 MG tablet Take 2 tablets (2 mg total) by mouth every 8 (eight) hours. Take 1 mg 3 times a day for catatonia symptoms 90 tablet 1  . OLANZapine (ZYPREXA) 5 MG tablet 5 mg qhs 90 tablet 1  . valACYclovir (VALTREX) 1000 MG tablet Take 1,000 mg by mouth.    . venlafaxine XR (EFFEXOR-XR) 150 MG 24 hr capsule Take 2 capsules (300 mg total) by mouth daily. 180 capsule 1   No current facility-administered medications for this visit.     Neurologic: Headache: Negative Seizure: Negative Paresthesias: Negative  Musculoskeletal: Strength & Muscle Tone: within normal limits Gait & Station:  normal Patient leans: N/A  Psychiatric Specialty Exam: ROS  Blood pressure 126/74, pulse 92, height 5\' 5"  (1.651 m), weight 125 lb 9.6 oz (57 kg).Body mass index is 20.9 kg/m.  General Appearance: Casual and Fairly Groomed  Eye Contact:  Good  Speech:  Clear and Coherent  Volume:  Normal  Mood:  Dysphoric and Euthymic  Affect:  Appropriate and Congruent  Thought Process:  Goal Directed  Orientation:  Full (  Time, Place, and Person)  Thought Content: Logical   Suicidal Thoughts:  No  Homicidal Thoughts:  No  Memory:  Immediate;   Good  Judgement:  Good  Insight:  Good  Psychomotor Activity:  Normal  Concentration:  Concentration: Fair  Recall:  Good  Fund of Knowledge: Good  Language: Good  Akathisia:  Negative  Handed:  Right  AIMS (if indicated):  0  Assets:  Communication Skills Desire for Improvement Financial Resources/Insurance Resilience Social Support Transportation Vocational/Educational  ADL's:  Intact  Cognition: WNL  Sleep:  7-9 hours nightly, good quality    Treatment Plan Summary: Benisha Hadaway is a 61 year old female with a history of autoimmune illness, and major depressive disorder with catatonic features. She presents today for psychiatric follow-up.  She has suffered a catastrophic loss, her husband Sonny died by suicide about 2 weeks ago. I had spoken with the patient and family over the phone, and she has remained safe with herself, and has not had any thoughts to harm herself. She continues to grieve and to be quite stunned regarding this loss. She has demonstrated impressive resilience, and has actually continued to have improvements in her depressive symptoms, improvements in her eating, improvements in her speech and psychomotor activity, and continues to respond well to the medication regimen as below.  We will continue to titrate Effexor and follow-up closely. She has also established therapeutic care with Trey Paula and reports that this is been a  tremendous support.    1. Catatonia   2. Severe episode of recurrent major depressive disorder, without psychotic features ([place])    Effexor increased to 300 mg XR Continue Ativan 2 mg 3 times daily for catatonia Zyprexa 5 mg nightly Follow-up with therapy provider, Trey Paula Follow-up with writer in 3 weeks Patient and family are aware on how to Secondary school teacher or clinic if any acute issues should arise  Aundra Dubin, MD 07/13/2016, 10:36 AM

## 2016-07-19 ENCOUNTER — Ambulatory Visit (INDEPENDENT_AMBULATORY_CARE_PROVIDER_SITE_OTHER): Payer: BLUE CROSS/BLUE SHIELD | Admitting: Psychology

## 2016-07-19 DIAGNOSIS — F332 Major depressive disorder, recurrent severe without psychotic features: Secondary | ICD-10-CM | POA: Diagnosis not present

## 2016-07-20 NOTE — Progress Notes (Signed)
Office Visit Note  Patient: Brianna Ross             Date of Birth: 1955/04/17           MRN: [id number]             PCP: Lanice Shirts, MD Referring: Lanice Shirts, * Visit Date: 07/26/2016 Occupation: @GUAROCC @    Subjective:  Follow-up (Doing good physically, husband committed suicide x 1 month, depression)   History of Present Illness: Brianna Ross is a 61 y.o. female with history of autoimmune disease and osteoarthritis. Her autoimmune disease has been stable. She has reduced her Plaquenil to every other day and also aspirin every other day after the last visit. She has been doing well without any increased joint pain or joint swelling. Sicca symptoms are tolerable.  Activities of Daily Living:  Patient reports morning stiffness for 0 minute.   Patient Denies nocturnal pain.  Difficulty dressing/grooming: Denies Difficulty climbing stairs: Denies Difficulty getting out of chair: Denies Difficulty using hands for taps, buttons, cutlery, and/or writing: Denies   Review of Systems  Constitutional: Negative for fatigue, night sweats, weight gain, weight loss and weakness.  HENT: Negative for mouth sores, trouble swallowing, trouble swallowing, mouth dryness and nose dryness.   Eyes: Negative for pain, redness, visual disturbance and dryness.  Respiratory: Negative for cough, shortness of breath and difficulty breathing.   Cardiovascular: Negative for chest pain, palpitations, hypertension, irregular heartbeat and swelling in legs/feet.  Gastrointestinal: Negative for blood in stool, constipation and diarrhea.  Endocrine: Negative for increased urination.  Genitourinary: Negative for vaginal dryness.  Musculoskeletal: Negative for arthralgias, joint pain, joint swelling, myalgias, muscle weakness, morning stiffness, muscle tenderness and myalgias.  Skin: Negative for color change, rash, hair loss, skin tightness, ulcers and sensitivity to sunlight.    Allergic/Immunologic: Negative for susceptible to infections.  Neurological: Negative for dizziness, memory loss and night sweats.  Hematological: Negative for swollen glands.  Psychiatric/Behavioral: Positive for depressed mood. Negative for sleep disturbance. The patient is not nervous/anxious.     PMFS History:  Patient Active Problem List   Diagnosis Date Noted  . Primary osteoarthritis of both hands 02/27/2016  . High risk medication use 02/20/2016  . Autoimmune disease (HCC)positive ANA, hypocomplementemia, positive beta 2 and positive anticardiolipin antibodies.  02/20/2016  . Sicca syndrome, unspecified ([place]) 02/20/2016  . History of sleep apnea 02/20/2016  . Severe major depression without psychotic features ([place]) 02/24/2015  . OSA on CPAP 08/11/2014  . Hearing loss 06/29/2014  . OSA treated with BiPAP 08/06/2013  . Hyperlipidemia 04/29/2013  . 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 Family history of heart disease 12/10/2012  . 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 Positive ANA (antinuclear antibody) 03/06/2012  . Elevated aldolase level 03/06/2012  . Atypical nevi 11/14/2011  . Menopause 11/13/2011  . Rosacea 11/13/2011    Past Medical History:  Diagnosis Date  . Abnormal stress electrocardiogram test   . Allergy   . ANA positive   . Atypical nevi   . Depression   . Headache(784.0)   . Hives 2011   x5  . Hyperlipidemia   . Menopause   . OSA (obstructive sleep apnea)   . Right wrist fracture   . Rosacea   . Shingles 03/2012  . Shortness of breath   . Sleep apnea   . Urticaria     Family History  Problem Relation Age of Onset  . Glaucoma Mother   .  Hyperlipidemia Mother   . Depression Mother   . Osteoporosis Mother   . Heart disease Father   . Hyperlipidemia Father   . Hypertension Father   . Heart defect Sister   . Heart disease Sister 13       congenital heart defect,deceased  . Hyperlipidemia Brother   .  Depression Brother   . High Cholesterol Brother   . Cancer Paternal Grandmother   . Stroke Paternal Grandfather   . Heart disease Paternal Grandfather    Past Surgical History:  Procedure Laterality Date  . ORIF WRIST FRACTURE    . thymic cyst     removal  . WISDOM TOOTH EXTRACTION     Social History   Social History Narrative   Patient works for [employer].(full-time_    Patient lives at home with her husband [name]).   Patient does not have any children.   Patient is right-handed.   Patient drinks one cup of coffee everyday and two cups of tea daily.     Objective: Vital Signs: BP 92/60 (BP Location: Left Arm, Patient Position: Sitting, Cuff Size: Normal)   Pulse 98   Resp 16   Ht 5' 5"  (1.651 m)   Wt 125 lb (56.7 kg)   BMI 20.80 kg/m    Physical Exam  Constitutional: She is oriented to person, place, and time. She appears well-developed and well-nourished.  HENT:  Head: Normocephalic and atraumatic.  Eyes: Conjunctivae and EOM are normal.  Neck: Normal range of motion.  Cardiovascular: Normal rate, regular rhythm, normal heart sounds and intact distal pulses.   Pulmonary/Chest: Effort normal and breath sounds normal.  Abdominal: Soft. Bowel sounds are normal.  Lymphadenopathy:    She has no cervical adenopathy.  Neurological: She is alert and oriented to person, place, and time.  Skin: Skin is warm and dry. Capillary refill takes less than 2 seconds.  Psychiatric: She has a normal mood and affect. Her behavior is normal.  Nursing note and vitals reviewed.    Musculoskeletal Exam: Spine and thoracic lumbar spine good range of motion. Shoulder joints elbow joints wrist joint MCPs PIPs DIPs are good range of motion. No synovitis was noted. Hip joints knee joints ankles MTPs PIPs DIPs are good range of motion with no synovitis. CDAI Exam: CDAI Homunculus Exam:   Joint Counts:  CDAI Tender Joint count: 0 CDAI Swollen Joint count: 0  Global  Assessments:  Patient Global Assessment: 0 Provider Global Assessment: 0    Investigation: No additional findings. CBC Latest Ref Rng & Units 04/25/2016 11/07/2015 11/13/2013  WBC 3.8 - 10.8 K/uL 9.3 4.6 4.3  Hemoglobin 11.7 - 15.5 g/dL 16.2(H) 14.1 14.1  Hematocrit 35.0 - 45.0 % 47.0(H) 41.8 41.6  Platelets 140 - 400 K/uL 322 254 239    CMP Latest Ref Rng & Units 04/25/2016 11/07/2015 11/13/2013  Glucose 65 - 99 mg/dL 96 79 81  BUN 7 - 25 mg/dL 8 10 11   Creatinine 0.50 - 1.05 mg/dL 0.73 0.72 0.75  Sodium 135 - 146 mmol/L 139 140 138  Potassium 3.5 - 5.3 mmol/L 3.9 4.0 4.0  Chloride 98 - 110 mmol/L 99 102 104  CO2 20 - 31 mmol/L 22 30 25   Calcium 8.6 - 10.4 mg/dL 10.2 9.9 9.6  Total Protein 6.1 - 8.1 g/dL 6.7 6.5 6.4  Total Bilirubin 0.2 - 1.2 mg/dL 0.6 0.5 0.5  Alkaline Phos 33 - 130 U/L 48 51 49  AST 10 - 35 U/L 15 18 18  ALT 6 - 29 U/L 12 19 24    04/25/2016 UA negative, 06/13/2016 vitamin D 50, ESR 2  Imaging: No results found.  Speciality Comments: No specialty comments available.    Procedures:  No procedures performed Allergies: Patient has no known allergies.   Assessment / Plan:     Visit Diagnoses: Autoimmune disease ([place]) history of positive ANA, hypocomplementemia, positive beta 2 and positive anticardiolipin antibodies.  -Clinically she is doing well with no synovitis on examination her sicca symptoms have improved as well. I will obtain following labs and few months. If her labs are stable we may even try to taper her off Plaquenil. Her anticardiolipin beta-2 antibodies were negative recently. - Plan: Urinalysis, Routine w reflex microscopic, Sedimentation rate, Beta-2 glycoprotein antibodies, Cardiolipin antibodies, IgG, IgM, IgA, C3 and C4, Anti-DNA antibody, double-stranded  High risk medication use - Plaquenil 200 mg by mouth every other day, aspirin 81 mg by mouth every other day - Plan: CBC with Differential/Platelet, COMPLETE METABOLIC PANEL WITH GFR. She  will get her eye exam this year.  Sicca syndrome [facility]): Over-the-counter products were discussed.  Primary osteoarthritis of both hands: The pain is manageable.  Severe major depression without psychotic features ([place]) : Increased depression as her husband committed suicide a month ago.  Her vitamin D was normal in June. I've advised her to schedule a DEXA through her PCP.  Her other medical problems are listed as follows:  Rosacea  History of sleep apnea  History of hyperlipidemia  History of herpes zoster keratoconjunctivitis     Orders: Orders Placed This Encounter  Procedures  . CBC with Differential/Platelet  . COMPLETE METABOLIC PANEL WITH GFR  . Urinalysis, Routine w reflex microscopic  . Sedimentation rate  . Beta-2 glycoprotein antibodies  . Cardiolipin antibodies, IgG, IgM, IgA  . C3 and C4  . Anti-DNA antibody, double-stranded   No orders of the defined types were placed in this encounter.   Face-to-face time spent with patient was 20 minutes. 50% of time was spent in counseling and coordination of care.  Follow-Up Instructions: Return in about 6 months (around 01/26/2017) for Autoimmune disease.   Bo Merino, MD  Note - This record has been created using Editor, commissioning.  Chart creation errors have been sought, but may not always  have been located. Such creation errors do not reflect on  the standard of medical care.

## 2016-07-26 ENCOUNTER — Ambulatory Visit (INDEPENDENT_AMBULATORY_CARE_PROVIDER_SITE_OTHER): Payer: BLUE CROSS/BLUE SHIELD | Admitting: Rheumatology

## 2016-07-26 ENCOUNTER — Encounter: Payer: Self-pay | Admitting: Rheumatology

## 2016-07-26 VITALS — BP 92/60 | HR 98 | Resp 16 | Ht 65.0 in | Wt 125.0 lb

## 2016-07-26 DIAGNOSIS — D8989 Other specified disorders involving the immune mechanism, not elsewhere classified: Secondary | ICD-10-CM

## 2016-07-26 DIAGNOSIS — Z8669 Personal history of other diseases of the nervous system and sense organs: Secondary | ICD-10-CM | POA: Diagnosis not present

## 2016-07-26 DIAGNOSIS — M359 Systemic involvement of connective tissue, unspecified: Secondary | ICD-10-CM

## 2016-07-26 DIAGNOSIS — L719 Rosacea, unspecified: Secondary | ICD-10-CM

## 2016-07-26 DIAGNOSIS — M19041 Primary osteoarthritis, right hand: Secondary | ICD-10-CM | POA: Diagnosis not present

## 2016-07-26 DIAGNOSIS — M3500 Sicca syndrome, unspecified: Secondary | ICD-10-CM

## 2016-07-26 DIAGNOSIS — Z79899 Other long term (current) drug therapy: Secondary | ICD-10-CM | POA: Diagnosis not present

## 2016-07-26 DIAGNOSIS — Z8619 Personal history of other infectious and parasitic diseases: Secondary | ICD-10-CM

## 2016-07-26 DIAGNOSIS — F322 Major depressive disorder, single episode, severe without psychotic features: Secondary | ICD-10-CM

## 2016-07-26 DIAGNOSIS — Z8639 Personal history of other endocrine, nutritional and metabolic disease: Secondary | ICD-10-CM

## 2016-07-26 DIAGNOSIS — M19042 Primary osteoarthritis, left hand: Secondary | ICD-10-CM

## 2016-07-26 NOTE — Progress Notes (Signed)
Rheumatology Medication Review by a Pharmacist Does the patient feel that his/her medications are working for him/her?  Yes Has the patient been experiencing any side effects to the medications prescribed?  No Does the patient have any problems obtaining medications?  No  Issues to address at subsequent visits: Hydroxychloroquine eye exam   Pharmacist comments:  Brianna Ross is a pleasant 61 yo F who presents for follow up of autoimmune disease.  She is currently taking hydroxychloroquine 200 mg every other day (dose reduced in February 2018).  Ross recent standing labs were on 04/25/16.  Ross recent hydroxychloroquine eye exam was normal on 06/06/15.  Patient reports she has an eye exam scheduled for October.  She reports this is the soonest she can be seen.  She will try to get on their cancellation list for a sooner appointment.  Patient denies any questions or concerns regarding her medications at this time.   Brianna Ross, Pharm.D., BCPS, CPP Clinical Pharmacist Pager: [phone] Phone: [phone] 07/26/2016 10:01 AM

## 2016-07-26 NOTE — Patient Instructions (Signed)
Standing Labs We placed an order today for your standing lab work.    Please come back and get your standing labs in September and every 5 months (ordered additional labs in September)  We have open lab Monday through Friday from 8:30-11:30 AM and 1:30-4 PM at the office of Dr. Bo Merino.   The office is located at [address], [place], [place], [place] [address] No appointment is necessary.   Labs are drawn by [facility].  You may receive a bill from [facility] for your lab work. If you have any questions regarding directions or hours of operation,  please call [phone].

## 2016-08-02 ENCOUNTER — Ambulatory Visit (INDEPENDENT_AMBULATORY_CARE_PROVIDER_SITE_OTHER): Payer: BLUE CROSS/BLUE SHIELD | Admitting: Psychology

## 2016-08-02 DIAGNOSIS — F332 Major depressive disorder, recurrent severe without psychotic features: Secondary | ICD-10-CM

## 2016-08-07 ENCOUNTER — Telehealth: Payer: Self-pay | Admitting: Neurology

## 2016-08-07 NOTE — Telephone Encounter (Signed)
Called to speak with pt  About her appointement scheduled on Thursday afternoon at 3:30 pm. Dr Brett Fairy will be out of the office and I am needing to try and reschedule the pt. I have 14th at 3:30 and 4 or we can make a spot for 20th at 4pm or 21st at 3:30. Please let me know if any of these will work for her and I will get her scheduled. LVM for pt to return call.

## 2016-08-08 ENCOUNTER — Ambulatory Visit (INDEPENDENT_AMBULATORY_CARE_PROVIDER_SITE_OTHER): Payer: BLUE CROSS/BLUE SHIELD | Admitting: Psychiatry

## 2016-08-08 ENCOUNTER — Encounter (HOSPITAL_COMMUNITY): Payer: Self-pay | Admitting: Psychiatry

## 2016-08-08 DIAGNOSIS — F332 Major depressive disorder, recurrent severe without psychotic features: Secondary | ICD-10-CM | POA: Diagnosis not present

## 2016-08-08 DIAGNOSIS — F061 Catatonic disorder due to known physiological condition: Secondary | ICD-10-CM

## 2016-08-08 MED ORDER — LORAZEPAM 1 MG PO TABS: 1.0000 mg | ORAL_TABLET | Freq: Three times a day (TID) | ORAL | 1 refills | Status: DC

## 2016-08-08 NOTE — Progress Notes (Signed)
BH MD/PA/NP OP Progress Note  08/08/2016 1:13 PM Brianna Ross  MRN:  [id number]  Chief Complaint: med check, follow-up  Subjective:  Brianna Ross is a 61 year old female who presents today for med management follow-up. She continues with severe depression symptoms, but has some subjective improvements, reporting that she has been able to enjoy some activities with her family more, she has also started to drive once again. She has spent some time visiting with her cat at her home, but continues to live with her brother full-time. She continues to blame herself and feel quite guilty about her husband's suicide, feeling that her depression contributed to his. I spent time with the patient sharing in her grief, and expressing my sadness for her loss. She denies any suicidal thoughts and reports that she has been able to stay strong because of her excellent family support.  She continues with some signs of catatonia, and reports that she decreased her Ativan to 1 mg at night and 1 mg in the morning, and then recently decreased it to 1 mg at night only. She's noticed more staring, her friend is in office and corroborates this. She increased Ativan back to 1 mg twice a day, and I encouraged her to use it 3 times a day as needed.  Spent time with the patient discussing some strategies for continuing on this trajectory of mood improvements. She overall feels like she is improving gradually, despite the tragic circumstances of her husband's recent suicide. She agrees to follow-up with writer in 4 weeks or sooner if needed.  We agreed to continue the current medication regimen, and she may up titrate Ativan to 1 mg 3 times a day as needed. I encouraged her to make gradual changes of decreasing Ativan, only if she is feeling sedated from it, and to decrease by half milligram doses.  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 Severe episode of recurrent major depressive  disorder, without psychotic features (Brianna Ross) F33.2 LORazepam (ATIVAN) 1 MG tablet    Past Psychiatric History: See intake H&P for full details. Reviewed, with no updates at this time.   Past Medical History:  Past Medical History:  Diagnosis Date  . Abnormal stress electrocardiogram test   . Allergy   . ANA positive   . Atypical nevi   . Depression   . Headache(784.0)   . Hives 2011   x5  . Hyperlipidemia   . Menopause   . OSA (obstructive sleep apnea)   . Right wrist fracture   . Rosacea   . Shingles 03/2012  . Shortness of breath   . Sleep apnea   . Urticaria     Past Surgical History:  Procedure Laterality Date  . ORIF WRIST FRACTURE    . thymic cyst     removal  . WISDOM TOOTH EXTRACTION      Family Psychiatric History: See intake H&P for full details. Reviewed, with no updates at this time.   Family History:  Family History  Problem Relation Age of Onset  . Glaucoma Mother   . Hyperlipidemia Mother   . Depression Mother   . Osteoporosis Mother   . Heart disease Father   . Hyperlipidemia Father   . Hypertension Father   . Heart defect Sister   . Heart disease Sister 13       congenital heart defect,deceased  . Hyperlipidemia Brother   . Depression Brother   . High Cholesterol  Brother   . Cancer Paternal Grandmother   . Stroke Paternal Grandfather   . Heart disease Paternal Grandfather     Social History:  Social History   Social History  . Marital status: Married    Spouse name: Brianna Ross  . Number of children: 0  . Years of education: 59   Occupational History  .  [employer]   Social History Main Topics  . Smoking status: Never Smoker  . Smokeless tobacco: Never Used  . Alcohol use No     Comment: rarely  . Drug use: No  . Sexual activity: Not Currently    Birth control/ protection: Post-menopausal   Other Topics Concern  . None   Social History Narrative   Patient works for [employer].(full-time_    Patient lives at home with her husband Brianna Ross).   Patient does not have any children.   Patient is right-handed.   Patient drinks one cup of coffee everyday and two cups of tea daily.    Allergies: No Known Allergies  Metabolic Disorder Labs: No results found for: HGBA1C, MPG No results found for: PROLACTIN Lab Results  Component Value Date   CHOL 188 11/13/2013   TRIG 82 11/13/2013   HDL 68 11/13/2013   CHOLHDL 2.8 11/13/2013   VLDL 16 11/13/2013   LDLCALC 104 (H) 11/13/2013   LDLCALC 129 (H) 11/03/2012     Current Medications: Current Outpatient Prescriptions  Medication Sig Dispense Refill  . acetaminophen (TYLENOL) 500 MG tablet Take 500 mg by mouth 2 (two) times daily as needed (pain).    Marland Kitchen aspirin EC 81 MG tablet Take 81 mg by mouth every other day.     . fluorometholone (FML) 0.1 % ophthalmic suspension Place 1 drop into the left eye daily.    . hydroxychloroquine (PLAQUENIL) 200 MG tablet TAKE 1 TABLET AT BEDTIME (Patient taking differently: TAKE 1 TABLET BY MOUTH EVERY OTHER DAY) 90 tablet 1  . LORazepam (ATIVAN) 1 MG tablet Take 1 tablet (1 mg total) by mouth every 8 (eight) hours. Take 1 mg 3 times a day for catatonia symptoms 90 tablet 1  . OLANZapine (ZYPREXA) 5 MG tablet 5 mg qhs 90 tablet 1  . valACYclovir (VALTREX) 1000 MG tablet Take 1,000 mg by mouth.    . venlafaxine XR (EFFEXOR-XR) 150 MG 24 hr capsule Take 2 capsules (300 mg total) by mouth daily. 180 capsule 1   No current facility-administered medications for this visit.     Neurologic: Headache: Negative Seizure: Negative Paresthesias: Negative  Musculoskeletal: Strength & Muscle Tone: within normal limits Gait & Station: normal Patient leans: N/A  Psychiatric Specialty Exam: ROS  Blood pressure 111/76, pulse 92, height 5\' 5"  (1.651 m), weight 127 lb 6.4 oz (57.8 kg).Body mass index is 21.2 kg/m.  General Appearance: Casual and Fairly Groomed  Eye Contact:   Periodic staring, fair eye contact  Speech:  Clear and Coherent improved inflections   Volume:  Normal  Mood:  Dysphoric and Flattened affect  Affect:  Congruent and Flat  Thought Process:  Goal Directed  Orientation:  Full (Time, Place, and Person)  Thought Content: Logical   Suicidal Thoughts:  No  Homicidal Thoughts:  No  Memory:  Immediate;   Good  Judgement:  Good  Insight:  Good  Psychomotor Activity:  Normal  Concentration:  Concentration: Fair  Recall:  Good  Fund of Knowledge: Good  Language: Good  Akathisia:  Negative  Handed:  Right  AIMS (  if indicated):  0  Assets:  Communication Skills Desire for Improvement Financial Resources/Insurance Resilience Social Support Transportation Vocational/Educational  ADL's:  Intact  Cognition: WNL  Sleep:  7-9 hours nightly, good quality    Treatment Plan Summary: Brianna Ross is a 61 year old female with a history of autoimmune illness, and major depressive disorder with catatonic features. She has previously received [place] and ECT without full resolution of her symptoms. ECT was more effective for her than her experience with Brianna Ross.    She presents today for psychiatric follow-up.  She has suffered a catastrophic loss, her husband Brianna Ross died by suicide about 6 weeks ago. She has had a good response to Effexor, titrated to 300 mg daily. She continues to make interval improvements in her mood symptoms, but given the severity of her recent losses and the severity of her symptoms, she will likely require disability support for the foreseeable future. She has hopes to return to work, which may be feasible next year once her living and family situation is more stable.  We continue medication management as below and will follow up in 4 weeks.  1. Catatonia   2. Severe episode of recurrent major depressive disorder, without psychotic features (Brianna Ross)    Effexor increased to 300 mg XR Continue Ativan 1 mg 3 times daily for catatonia Zyprexa 5  mg nightly Follow-up with therapy provider, Brianna Ross Follow-up with writer in 4 weeks Patient and family are aware on how to Secondary school teacher or clinic if any acute issues should arise  Aundra Dubin, MD 08/08/2016, 1:13 PM

## 2016-08-09 ENCOUNTER — Ambulatory Visit: Payer: BLUE CROSS/BLUE SHIELD | Admitting: Neurology

## 2016-08-13 ENCOUNTER — Encounter: Payer: Self-pay | Admitting: Neurology

## 2016-08-14 ENCOUNTER — Encounter: Payer: Self-pay | Admitting: Neurology

## 2016-08-14 ENCOUNTER — Ambulatory Visit (INDEPENDENT_AMBULATORY_CARE_PROVIDER_SITE_OTHER): Payer: BLUE CROSS/BLUE SHIELD | Admitting: Neurology

## 2016-08-14 VITALS — BP 107/77 | HR 95 | Ht 65.0 in | Wt 127.0 lb

## 2016-08-14 DIAGNOSIS — F332 Major depressive disorder, recurrent severe without psychotic features: Secondary | ICD-10-CM | POA: Diagnosis not present

## 2016-08-14 DIAGNOSIS — G4731 Primary central sleep apnea: Secondary | ICD-10-CM

## 2016-08-14 NOTE — Progress Notes (Signed)
[facility]   Provider:  Larey Seat, M D  Referring Provider: Lanice Shirts, * Primary Care Physician:  Lanice Shirts, MD  Chief Complaint  Patient presents with  . Follow-up    pt is alone. pt is dealing with depression. pt hasnt used bipap the whole year until aug 1st. pt stopped using it cause it was a hassle and pt states she felt like she slept better without it and didnt feel sleepy or tired during the day.     HPI:  Brianna Ross is a 61 y.o. female , who  is seen here as a revisit  from Dr. Coralyn Mark for BiPAP compliance.   Interval history from 08/14/2016. Brianna Ross reports today that she just restarted using her BiPAP / VPAPon July 31. She had suffered from severe depression and lost her husband in June to suicide. She therefore did not use the machine, feeling too depressed. Her settings have been the same for 2 years now. As she resumed CPAP just 14 days ago she has been 80% compliant for these 14 days with an average user time of 6 hours and 40 minutes, inspiratory pressure 13 cm water expiratory pressure is set at 8 cm water she has high air leaks probably due to the age of the mask which gets brittle. She hasn't had a fresh mask in a year. She has a residual AHI of 6 which probably has to be corrected for air leak related errors.  I will ask her to restart using with a fresh mask- FFM medium,        I first encountered Brianna Ross after she endorsed excessive daytime sleepiness in the setting of some depression for over 30 years.  She had initially endorsed the  Epworth sleepiness score at 15 points, a very high reading.  She underwent a polysomnography  Was diagnsoed with OSA and then return for a titration study on 3-2 -14 . The  patient had insomnia but also became evident that she needed to be on CPAP  Therapy, as her AHI was 57.9 per hour of sleep.  The patient then started on BiPAP with 13 cm water over 8 cm water. During the titration study  she was able to sleep 67 minutes of which 10 minutes were rapid eye movement sleep. Her last download from 06-19-12 showed a  user time of 6 hours and 48 minutes. She responded being  refreshed and endorsed the fatigue severity scale at 12 points only and the Epworth sleepiness score at 4 points down from 15. Brianna Ross's VPAP shows a residual AHI of 8.3, a respiratory rate of 14 per minute tidal volume of 9 20 mm . inspiratory pressure of 13  centimeter water , expiratory pressure 8 cm water .The  time of use of BiPAP/ VPAP is 6 hours and 51 minutes , with  94% compliance.Her sleep habits are as follows: She goes to bed at 11 Pm , falls asleep promptly and sleeps through until 6 AM . She has an alarm clock set .  She drinks 1 cup of coffee in the morning, and 2 cups of tea in daytime.  She fees well and has been "loving " CPAP sleep, but the mask took some getting used to.   Interval history from 08-11-14, Brianna Ross has a new primary care physician, Dr. Jake Bathe.  Her headaches and her sleep habits  Are stable, nothing has changed and this is a routine revisit.  In spring  She fought a severe depressive episode , but her sleep normalized by June- July. Epworth sleepiness score is endorsed at 5 points and fatigue severity score at 15 points. CPAP compliance is 80% for over 4 hours of daily use average time of use is 5 hours and 40 minutes during a brief period of depression in spring she was partially noncompliant with her CPAP use but now she has begun using it again. Her AHI is 4.8 which is sufficient and she does have moderate to high air leaks. She received a new mask and gear 3 weeks ago.   Interval history from 08/10/2015, I have the pleasure of seeing Brianna Ross today in a follow-up visit for compliance with her BiPAP-ASV. The patient has 97% compliance for daily use an average of 7 hours and 2 minutes, she has an inspiratory pressure support of 13 expiratory pressure of 8 cm water and a residual  AHI of 12.4 which is a little high. The residual apnea seem to be all obstructive in nature I have also noticed a significant amount of new air leaks. She feels also that she has to change interface it would probably be best to address the leak first before we do any other changes to her settings. She endorsed only 5 points on the Epworth sleepiness score and 10 on the fatigue severity score, so clinically she is doing very well.    Review of Systems: Out of a complete 14 system review, the patient complains of only the following symptoms, and all other reviewed systems are negative.  The patient compliance has exceeded 79%  for PAP but only for 14 out of 90 days.  Morning headaches have completely resolved sleepiness is much improved attention is improved. She has nor nocturia and on CPAP. She endorsed the Epworth sleepiness score today at 7 points, fatigue severity at 35 points and the geriatric depression scale at 30 out of 15 points. She lost weight.      Social History   Social History  . Marital status: Married    Spouse name: Sonny  . Number of children: 0  . Years of education: 66   Occupational History  .  [employer]   Social History Main Topics  . Smoking status: Never Smoker  . Smokeless tobacco: Never Used  . Alcohol use No     Comment: rarely  . Drug use: No  . Sexual activity: Not Currently    Birth control/ protection: Post-menopausal   Other Topics Concern  . Not on file   Social History Narrative   Patient works for [employer].(full-time_    Patient lives at home with her husband Brianna Ross).   Patient does not have any children.   Patient is right-handed.   Patient drinks one cup of coffee everyday and two cups of tea daily.    Family History  Problem Relation Age of Onset  . Glaucoma Mother   . Hyperlipidemia Mother   . Depression Mother   . Osteoporosis Mother   . Heart disease Father   . Hyperlipidemia Father    . Hypertension Father   . Heart defect Sister   . Heart disease Sister 13       congenital heart defect,deceased  . Hyperlipidemia Brother   . Depression Brother   . High Cholesterol Brother   . Cancer Paternal Grandmother   . Stroke Paternal Grandfather   . Heart disease Paternal Grandfather     Past Medical History:  Diagnosis  Date  . Abnormal stress electrocardiogram test   . Allergy   . ANA positive   . Atypical nevi   . Depression   . Headache(784.0)   . Hives 2011   x5  . Hyperlipidemia   . Menopause   . OSA (obstructive sleep apnea)   . Right wrist fracture   . Rosacea   . Shingles 03/2012  . Shortness of breath   . Sleep apnea   . Urticaria     Past Surgical History:  Procedure Laterality Date  . ORIF WRIST FRACTURE    . thymic cyst     removal  . WISDOM TOOTH EXTRACTION      Current Outpatient Prescriptions  Medication Sig Dispense Refill  . acetaminophen (TYLENOL) 500 MG tablet Take 500 mg by mouth 2 (two) times daily as needed (pain).    Marland Kitchen aspirin EC 81 MG tablet Take 81 mg by mouth every other day.     . fluorometholone (FML) 0.1 % ophthalmic suspension Place 1 drop into the left eye daily.    . hydroxychloroquine (PLAQUENIL) 200 MG tablet TAKE 1 TABLET AT BEDTIME (Patient taking differently: TAKE 1 TABLET BY MOUTH EVERY OTHER DAY) 90 tablet 1  . LORazepam (ATIVAN) 1 MG tablet Take 1 tablet (1 mg total) by mouth every 8 (eight) hours. Take 1 mg 3 times a day for catatonia symptoms 90 tablet 1  . OLANZapine (ZYPREXA) 5 MG tablet 5 mg qhs 90 tablet 1  . valACYclovir (VALTREX) 1000 MG tablet Take 1,000 mg by mouth.    . venlafaxine XR (EFFEXOR-XR) 150 MG 24 hr capsule Take 2 capsules (300 mg total) by mouth daily. 180 capsule 1   No current facility-administered medications for this visit.     Allergies as of 08/14/2016  . (No Known Allergies)    Vitals: BP 107/77   Pulse 95   Ht 5\' 5"  (1.651 m)   Wt 127 lb (57.6 kg)   BMI 21.13 kg/m  Last  Weight:  Wt Readings from Last 1 Encounters:  08/14/16 127 lb (57.6 kg)       Last Height:   Ht Readings from Last 1 Encounters:  08/14/16 5\' 5"  (1.651 m)    Assessment:  1) complex OSA, clinically well responding to  VPAP- I have changed her prescription to an AIR TOUCH F 20  medium size.   The patient was advised of the nature of the diagnosed sleep disorder , the treatment options and risks for general a health and wellness arising from not treating the condition. Visit duration was 15 minutes.   Plan:  Treatment plan and additional workup :  re-start and continue BipAP use, RV in 12 month with me.  Notes to [facility] about new mask order medium size full facemask, air touch by [employer], F 20. Medium size      Asencion Partridge Zyion Doxtater MD  08/14/2016

## 2016-08-15 ENCOUNTER — Encounter (HOSPITAL_COMMUNITY): Payer: Self-pay | Admitting: Psychiatry

## 2016-08-16 ENCOUNTER — Other Ambulatory Visit (HOSPITAL_COMMUNITY): Payer: Self-pay | Admitting: Psychiatry

## 2016-08-16 DIAGNOSIS — F061 Catatonic disorder due to known physiological condition: Secondary | ICD-10-CM

## 2016-08-16 DIAGNOSIS — F332 Major depressive disorder, recurrent severe without psychotic features: Secondary | ICD-10-CM

## 2016-08-20 ENCOUNTER — Ambulatory Visit (INDEPENDENT_AMBULATORY_CARE_PROVIDER_SITE_OTHER): Payer: BLUE CROSS/BLUE SHIELD | Admitting: Psychology

## 2016-08-20 DIAGNOSIS — F332 Major depressive disorder, recurrent severe without psychotic features: Secondary | ICD-10-CM | POA: Diagnosis not present

## 2016-09-05 ENCOUNTER — Ambulatory Visit (INDEPENDENT_AMBULATORY_CARE_PROVIDER_SITE_OTHER): Payer: BLUE CROSS/BLUE SHIELD | Admitting: Psychology

## 2016-09-05 DIAGNOSIS — F332 Major depressive disorder, recurrent severe without psychotic features: Secondary | ICD-10-CM

## 2016-09-07 ENCOUNTER — Encounter (HOSPITAL_COMMUNITY): Payer: Self-pay | Admitting: Psychiatry

## 2016-09-07 ENCOUNTER — Ambulatory Visit (INDEPENDENT_AMBULATORY_CARE_PROVIDER_SITE_OTHER): Payer: BLUE CROSS/BLUE SHIELD | Admitting: Psychiatry

## 2016-09-07 VITALS — BP 101/70 | HR 102 | Ht 65.0 in | Wt 128.8 lb

## 2016-09-07 DIAGNOSIS — F061 Catatonic disorder due to known physiological condition: Secondary | ICD-10-CM | POA: Diagnosis not present

## 2016-09-07 DIAGNOSIS — Z818 Family history of other mental and behavioral disorders: Secondary | ICD-10-CM

## 2016-09-07 DIAGNOSIS — F332 Major depressive disorder, recurrent severe without psychotic features: Secondary | ICD-10-CM | POA: Diagnosis not present

## 2016-09-07 NOTE — Progress Notes (Signed)
BH MD/PA/NP OP Progress Note  09/07/2016 11:37 AM Brianna Ross  MRN:  [id number]  Chief Complaint: med management, depression  HPI: Brianna Ross presents for med management for depression. She continues to have some flattening of affect, and much of our discussion today was spent in processing her grief. With her having taking care of the aftermath of the death of her husband, in terms of managing the estate, lawyers offices, paperwork related to his death, moving into her brother's house, she has now finally slowed down enough for the grief and the reality of this tragedy to set in. She is going to be joining a grief group that will meet for the next 12 weeks every week.  She denies any safety issues, but continues to miss her husband greatly, and wishes he was here.  She reports that she is taking Ativan 2 mg in the morning, 1 mg in the afternoon, and 2 mg at night.  I encouraged her to go ahead and increase to 2 mg 3 times daily, given some of her flattening of affect, decreased blinking. She reports that she continues to try to work on being active. We agreed to come up some strategies, and she was agreeable to incorporating walking, journaling, playing with her cat. She also is trying to work on getting used to her BiPAP machine once again. I educated her on how this could contribute to treatment resistance in depression, and she was very receptive to this.  Spent time validating and normalizing her sorrow and grief, and sitting with her in this difficult time. She agrees to follow-up in one month, and continues to follow-up with her individual therapist, Dr. Melburn Hake.  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 Severe episode of recurrent major depressive disorder, without psychotic features ([place]) F33.2     Past Psychiatric History: See intake H&P for full details. Reviewed, with no updates at this time.   Past Medical History:  Past Medical History:  Diagnosis Date  . Abnormal stress  electrocardiogram test   . Allergy   . ANA positive   . Atypical nevi   . Depression   . Headache(784.0)   . Hives 2011   x5  . Hyperlipidemia   . Menopause   . OSA (obstructive sleep apnea)   . Right wrist fracture   . Rosacea   . Shingles 03/2012  . Shortness of breath   . Sleep apnea   . Urticaria     Past Surgical History:  Procedure Laterality Date  . ORIF WRIST FRACTURE    . thymic cyst     removal  . WISDOM TOOTH EXTRACTION      Family Psychiatric History: See intake H&P for full details. Reviewed, with no updates at this time.   Family History:  Family History  Problem Relation Age of Onset  . Glaucoma Mother   . Hyperlipidemia Mother   . Depression Mother   . Osteoporosis Mother   . Heart disease Father   . Hyperlipidemia Father   . Hypertension Father   . Heart defect Sister   . Heart disease Sister 13       congenital heart defect,deceased  . Hyperlipidemia Brother   . Depression Brother   . High Cholesterol Brother   . Cancer Paternal Grandmother   . Stroke Paternal Grandfather   . Heart disease Paternal Grandfather     Social History:  Social History   Social History  . Marital status:  Married    Spouse name: [name]  . Number of children: 0  . Years of education: 55   Occupational History  .  [employer]   Social History Main Topics  . Smoking status: Never Smoker  . Smokeless tobacco: Never Used  . Alcohol use No     Comment: rarely  . Drug use: No  . Sexual activity: Not Currently    Birth control/ protection: Post-menopausal   Other Topics Concern  . None   Social History Narrative   Patient works for [employer].(full-time_    Patient lives at home with her husband [name]).   Patient does not have any children.   Patient is right-handed.   Patient drinks one cup of coffee everyday and two cups of tea daily.    Allergies: No Known Allergies  Metabolic Disorder Labs: No results  found for: HGBA1C, MPG No results found for: PROLACTIN Lab Results  Component Value Date   CHOL 188 11/13/2013   TRIG 82 11/13/2013   HDL 68 11/13/2013   CHOLHDL 2.8 11/13/2013   VLDL 16 11/13/2013   LDLCALC 104 (H) 11/13/2013   LDLCALC 129 (H) 11/03/2012   Lab Results  Component Value Date   TSH 1.684 11/13/2013   TSH 1.092 11/03/2012    Therapeutic Level Labs: No results found for: LITHIUM No results found for: VALPROATE No components found for:  CBMZ  Current Medications: Current Outpatient Prescriptions  Medication Sig Dispense Refill  . acetaminophen (TYLENOL) 500 MG tablet Take 500 mg by mouth 2 (two) times daily as needed (pain).    Marland Kitchen aspirin EC 81 MG tablet Take 81 mg by mouth every other day.     . fluorometholone (FML) 0.1 % ophthalmic suspension Place 1 drop into the left eye daily.    . hydroxychloroquine (PLAQUENIL) 200 MG tablet TAKE 1 TABLET AT BEDTIME (Patient taking differently: TAKE 1 TABLET BY MOUTH EVERY OTHER DAY) 90 tablet 1  . LORazepam (ATIVAN) 1 MG tablet Take 1 tablet (1 mg total) by mouth every 8 (eight) hours. Take 1 mg 3 times a day for catatonia symptoms 90 tablet 1  . OLANZapine (ZYPREXA) 5 MG tablet 5 mg qhs 90 tablet 1  . valACYclovir (VALTREX) 1000 MG tablet Take 1,000 mg by mouth.    . venlafaxine XR (EFFEXOR-XR) 150 MG 24 hr capsule Take 2 capsules (300 mg total) by mouth daily. 180 capsule 1   No current facility-administered medications for this visit.      Musculoskeletal: Strength & Muscle Tone: within normal limits Gait & Station: normal Patient leans: N/A  Psychiatric Specialty Exam: ROS  Blood pressure 101/70, pulse (!) 102, height 5\' 5"  (1.651 m), weight 128 lb 12.8 oz (58.4 kg).Body mass index is 21.43 kg/m.  General Appearance: Casual and Fairly Groomed  Eye Contact:  Good  Speech:  Clear and Coherent and Normal Rate  Volume:  Normal  Mood:  Dysphoric  Affect:  Congruent and Flat  Thought Process:  Goal Directed   Orientation:  Full (Time, Place, and Person)  Thought Content: Logical   Suicidal Thoughts:  No  Homicidal Thoughts:  No  Memory:  Immediate;   Good  Judgement:  Good  Insight:  Good  Psychomotor Activity:  Normal  Concentration:  Concentration: Good  Recall:  Good  Fund of Knowledge: Good  Language: Fair  Akathisia:  Negative  Handed:  Right  AIMS (if indicated): not done  Assets:  Communication Skills Desire for Improvement Financial  Resources/Insurance Housing Resilience Social Support Talents/Skills Transportation Vocational/Educational  ADL's:  Intact  Cognition: WNL  Sleep:  Good   Screenings:   Assessment and Plan: Brianna Ross is a 60 year old female with MDD with catatonic features, who presents today for med management follow-up. She has had substantial stressors given the recent suicide of her husband, but she has had tremendous family support, and has shown significant resilience in the face of this tragedy.  She has slowed down in terms of busy work related to the death, and now the grief and sadness and sorrow of this tragedy are starting to hit her. She presents today with some psychomotor slowing, flattening of affect, so we agreed to increase Ativan. She does not have any acute safety issues, and has tremendous support, we will follow-up in 1 month.  1. Catatonia   2. Severe episode of recurrent major depressive disorder, without psychotic features ([facility])    Effexor 300 mg XR daily Ativan 2 mg 3 times daily for catatonia Zyprexa 5 mg nightly Ongoing individual therapy Return to clinic in 1 month  Aundra Dubin, MD 09/07/2016, 11:37 AM

## 2016-09-15 ENCOUNTER — Other Ambulatory Visit: Payer: Self-pay | Admitting: Rheumatology

## 2016-09-17 NOTE — Telephone Encounter (Signed)
ok 

## 2016-09-17 NOTE — Telephone Encounter (Signed)
Last Visit: 07/26/16 Next Visit: 01/25/17 Labs: 04/25/16 Hb is high. Will monitor for now. PLQ Eye Exam: 06/06/15 WNL Patient states she has it scheduled for 10/2016.   Okay to refill PLQ?

## 2016-09-18 ENCOUNTER — Ambulatory Visit (INDEPENDENT_AMBULATORY_CARE_PROVIDER_SITE_OTHER): Payer: BLUE CROSS/BLUE SHIELD | Admitting: Psychology

## 2016-09-18 DIAGNOSIS — F332 Major depressive disorder, recurrent severe without psychotic features: Secondary | ICD-10-CM

## 2016-09-25 ENCOUNTER — Other Ambulatory Visit: Payer: Self-pay | Admitting: *Deleted

## 2016-09-25 DIAGNOSIS — M359 Systemic involvement of connective tissue, unspecified: Secondary | ICD-10-CM

## 2016-09-25 DIAGNOSIS — Z79899 Other long term (current) drug therapy: Secondary | ICD-10-CM

## 2016-09-26 LAB — COMPLETE METABOLIC PANEL WITH GFR
AG RATIO: 2.5 (calc) (ref 1.0–2.5)
ALBUMIN MSPROF: 4.2 g/dL (ref 3.6–5.1)
ALT: 26 U/L (ref 6–29)
AST: 18 U/L (ref 10–35)
Alkaline phosphatase (APISO): 53 U/L (ref 33–130)
BUN: 14 mg/dL (ref 7–25)
CALCIUM: 9.2 mg/dL (ref 8.6–10.4)
CO2: 30 mmol/L (ref 20–32)
Chloride: 104 mmol/L (ref 98–110)
Creat: 0.67 mg/dL (ref 0.50–0.99)
GFR, EST AFRICAN AMERICAN: 111 mL/min/{1.73_m2} (ref >=60)
GFR, EST NON AFRICAN AMERICAN: 96 mL/min/{1.73_m2} (ref >=60)
GLOBULIN: 1.7 g/dL — AB (ref 1.9–3.7)
Glucose, Bld: 86 mg/dL (ref 65–99)
POTASSIUM: 4 mmol/L (ref 3.5–5.3)
SODIUM: 140 mmol/L (ref 135–146)
TOTAL PROTEIN: 5.9 g/dL — AB (ref 6.1–8.1)
Total Bilirubin: 0.4 mg/dL (ref 0.2–1.2)

## 2016-09-26 LAB — CBC WITH DIFFERENTIAL/PLATELET
BASOS ABS: 41 {cells}/uL (ref 0–200)
BASOS PCT: 0.7 %
EOS ABS: 139 {cells}/uL (ref 15–500)
Eosinophils Relative: 2.4 %
HEMATOCRIT: 40 % (ref 35.0–45.0)
HEMOGLOBIN: 13.8 g/dL (ref 11.7–15.5)
LYMPHS ABS: 1508 {cells}/uL (ref 850–3900)
MCH: 31 pg (ref 27.0–33.0)
MCHC: 34.5 g/dL (ref 32.0–36.0)
MCV: 89.9 fL (ref 80.0–100.0)
MPV: 9.8 fL (ref 7.5–12.5)
Monocytes Relative: 10.2 %
NEUTROS ABS: 3521 {cells}/uL (ref 1500–7800)
Neutrophils Relative %: 60.7 %
Platelets: 239 10*3/uL (ref 140–400)
RBC: 4.45 10*6/uL (ref 3.80–5.10)
RDW: 12.5 % (ref 11.0–15.0)
Total Lymphocyte: 26 %
WBC: 5.8 10*3/uL (ref 3.8–10.8)
WBCMIX: 592 {cells}/uL (ref 200–950)

## 2016-09-26 LAB — URINALYSIS, ROUTINE W REFLEX MICROSCOPIC
BILIRUBIN URINE: NEGATIVE
GLUCOSE, UA: NEGATIVE
Hgb urine dipstick: NEGATIVE
Ketones, ur: NEGATIVE
LEUKOCYTES UA: NEGATIVE
Nitrite: NEGATIVE
PROTEIN: NEGATIVE
Specific Gravity, Urine: 1.008 (ref 1.001–1.03)
pH: 7 (ref 5.0–8.0)

## 2016-09-26 LAB — CARDIOLIPIN ANTIBODIES, IGG, IGM, IGA: ANTICARDIOLIPIN IGG: 23 [GPL'U] — AB

## 2016-09-26 LAB — BETA-2 GLYCOPROTEIN ANTIBODIES
Beta-2 Glyco 1 IgA: 20 SAU (ref <=20)
Beta-2 Glyco 1 IgM: 15 SMU (ref <=20)
Beta-2 Glyco I IgG: 21 SGU — ABNORMAL HIGH (ref <=20)

## 2016-09-26 LAB — SEDIMENTATION RATE: SED RATE: 2 mm/h (ref 0–30)

## 2016-09-26 LAB — ANTI-DNA ANTIBODY, DOUBLE-STRANDED: DS DNA AB: 1 [IU]/mL

## 2016-09-26 LAB — C3 AND C4
C3 Complement: 86 mg/dL (ref 83–193)
C4 Complement: 14 mg/dL — ABNORMAL LOW (ref 15–57)

## 2016-09-27 NOTE — Progress Notes (Signed)
Labs are stable.

## 2016-09-28 NOTE — Progress Notes (Signed)
She has low positive anticardiolipin antibodies . She may reduce Plaquenil to one tablet every other day and aspirin to one tablet every other day.

## 2016-10-01 ENCOUNTER — Other Ambulatory Visit (HOSPITAL_COMMUNITY): Payer: Self-pay | Admitting: Psychiatry

## 2016-10-01 DIAGNOSIS — F061 Catatonic disorder due to known physiological condition: Secondary | ICD-10-CM

## 2016-10-01 DIAGNOSIS — F332 Major depressive disorder, recurrent severe without psychotic features: Secondary | ICD-10-CM

## 2016-10-05 ENCOUNTER — Ambulatory Visit (INDEPENDENT_AMBULATORY_CARE_PROVIDER_SITE_OTHER): Payer: BLUE CROSS/BLUE SHIELD | Admitting: Psychology

## 2016-10-05 DIAGNOSIS — F332 Major depressive disorder, recurrent severe without psychotic features: Secondary | ICD-10-CM

## 2016-10-17 ENCOUNTER — Ambulatory Visit (INDEPENDENT_AMBULATORY_CARE_PROVIDER_SITE_OTHER): Payer: BLUE CROSS/BLUE SHIELD | Admitting: Psychology

## 2016-10-17 DIAGNOSIS — F332 Major depressive disorder, recurrent severe without psychotic features: Secondary | ICD-10-CM | POA: Diagnosis not present

## 2016-10-22 ENCOUNTER — Ambulatory Visit (INDEPENDENT_AMBULATORY_CARE_PROVIDER_SITE_OTHER): Payer: BLUE CROSS/BLUE SHIELD | Admitting: Psychiatry

## 2016-10-22 DIAGNOSIS — F332 Major depressive disorder, recurrent severe without psychotic features: Secondary | ICD-10-CM | POA: Diagnosis not present

## 2016-10-22 DIAGNOSIS — F061 Catatonic disorder due to known physiological condition: Secondary | ICD-10-CM | POA: Diagnosis not present

## 2016-10-22 DIAGNOSIS — Z634 Disappearance and death of family member: Secondary | ICD-10-CM

## 2016-10-22 DIAGNOSIS — Z818 Family history of other mental and behavioral disorders: Secondary | ICD-10-CM | POA: Diagnosis not present

## 2016-10-22 MED ORDER — VENLAFAXINE HCL ER 150 MG PO CP24: 300.0000 mg | ORAL_CAPSULE | Freq: Every day | ORAL | 1 refills | Status: DC

## 2016-10-22 MED ORDER — OLANZAPINE 5 MG PO TABS: ORAL_TABLET | ORAL | 1 refills | Status: DC

## 2016-10-22 NOTE — Progress Notes (Signed)
[facility] MD/PA/NP OP Progress Note  10/22/2016 4:09 PM Brianna Ross  MRN:  [id number]  Chief Complaint: med check HPI: Brianna Ross reports that things are going well with her mood, in terms of stability.  She continues to live with her brother, and has been actively starting to think about cleaning her house to put it for sale in the spring.  She reports that she goes to check on her cat every day and it has become a little bit more easy to be at the house for short periods of time.  She is participating in a grief group, and this has been somewhat helpful.  She continues to struggle with her faith.  She continues to spend time with [facility] and reports that this is a positive support.  She reports that over the past couple weeks, she has gone camping overnight with her nephew, and reports this was a positive experience.  She reports that she is able to enjoy some of the activities she does, and her appetite is been slightly better.  She denies any suicidal thoughts.  She has started to cry more when she thinks about Brianna Ross, and feels this is positive because she is letting out some of her grief.  She denies any substance use or alcohol use.  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  She still remains with some hyperactivity and periods of staring.  She is taking Ativan 2 mg 3 times a day.   We completed an aims today.  We agreed to maintain the current regimen, given that the holidays will be difficult since this is her first Thanksgiving and Christmas without her husband.  Visit Diagnosis:    ICD-10-CM   1. Severe episode of recurrent major depressive disorder, without psychotic features (HCC) F33.2 OLANZapine (ZYPREXA) 5 MG tablet    venlafaxine XR (EFFEXOR-XR) 150 MG 24 hr capsule  2. 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 Reviewed, with no updates at this time.   Past Medical History:  Past Medical History:  Diagnosis Date  . Abnormal stress electrocardiogram test   . Allergy   . ANA positive   . Atypical nevi   . Depression   . Headache(784.0)   . Hives 2011   x5  . Hyperlipidemia   . Menopause   . OSA (obstructive sleep apnea)   . Right wrist fracture   . Rosacea   . Shingles 03/2012  . Shortness of breath   . Sleep apnea   . Urticaria     Past Surgical History:  Procedure Laterality Date  . ORIF WRIST FRACTURE    . thymic cyst     removal  . WISDOM TOOTH EXTRACTION      Family Psychiatric History: See intake H&P for full details. Reviewed, with no updates at this time.    Family History:  Family History  Problem Relation Age of Onset  . Glaucoma Mother   . Hyperlipidemia Mother   . Depression Mother   . Osteoporosis Mother   . Heart disease Father   . Hyperlipidemia Father   . Hypertension Father   . Heart defect Sister   . Heart disease Sister 13       congenital heart defect,deceased  . Hyperlipidemia Brother   . Depression Brother   .  High Cholesterol Brother   . Cancer Paternal Grandmother   . Stroke Paternal Grandfather   . Heart disease Paternal Grandfather     Social History:  Social History   Social History  . Marital status: Married    Spouse name: Brianna Ross  . Number of children: 0  . Years of education: 69   Occupational History  .  [employer]   Social History Main Topics  . Smoking status: Never Smoker  . Smokeless tobacco: Never Used  . Alcohol use No     Comment: rarely  . Drug use: No  . Sexual activity: Not Currently    Birth control/ protection: Post-menopausal   Other Topics Concern  . Not on file   Social History Narrative   Patient works for [employer].(full-time_    Patient lives at home with her husband Brianna Ross).   Patient does not have any children.    Patient is right-handed.   Patient drinks one cup of coffee everyday and two cups of tea daily.    Allergies: No Known Allergies  Metabolic Disorder Labs: No results found for: HGBA1C, MPG No results found for: PROLACTIN Lab Results  Component Value Date   CHOL 188 11/13/2013   TRIG 82 11/13/2013   HDL 68 11/13/2013   CHOLHDL 2.8 11/13/2013   VLDL 16 11/13/2013   LDLCALC 104 (H) 11/13/2013   LDLCALC 129 (H) 11/03/2012   Lab Results  Component Value Date   TSH 1.684 11/13/2013   TSH 1.092 11/03/2012    Therapeutic Level Labs: No results found for: LITHIUM No results found for: VALPROATE No components found for:  CBMZ  Current Medications: Current Outpatient Prescriptions  Medication Sig Dispense Refill  . acetaminophen (TYLENOL) 500 MG tablet Take 500 mg by mouth 2 (two) times daily as needed (pain).    Marland Kitchen aspirin EC 81 MG tablet Take 81 mg by mouth every other day.     . fluorometholone (FML) 0.1 % ophthalmic suspension Place 1 drop into the left eye daily.    . hydroxychloroquine (PLAQUENIL) 200 MG tablet TAKE 1 TABLET AT BEDTIME 90 tablet 1  . LORazepam (ATIVAN) 1 MG tablet TAKE 2 TABLETS BY MOUTH 3 TIMES A DAY AS NEEDED 180 tablet 0  . OLANZapine (ZYPREXA) 5 MG tablet 5 mg qhs 90 tablet 1  . valACYclovir (VALTREX) 1000 MG tablet Take 1,000 mg by mouth.    . venlafaxine XR (EFFEXOR-XR) 150 MG 24 hr capsule Take 2 capsules (300 mg total) by mouth daily. 180 capsule 1   No current facility-administered medications for this visit.      Musculoskeletal: Strength & Muscle Tone: within normal limits Gait & Station: normal Patient leans: N/A  Wt Readings from Last 3 Encounters:  10/22/16 133 lb 6.4 oz (60.5 kg)  09/07/16 128 lb 12.8 oz (58.4 kg)  08/14/16 127 lb (57.6 kg)    Psychiatric Specialty Exam: ROS  Blood pressure 122/74, pulse 83, height 5\' 5"  (1.651 m), weight 133 lb 6.4 oz (60.5 kg).Body mass index is 22.2 kg/m.  General Appearance: Casual and Well  Groomed  Eye Contact:  Fair  Speech:  Clear and Coherent  Volume:  Normal  Mood:  Dysphoric and Euthymic  Affect:  Appropriate and Congruent  Thought Process:  Goal Directed and Descriptions of Associations: Intact  Orientation:  Full (Time, Place, and Person)  Thought Content: Logical   Suicidal Thoughts:  No  Homicidal Thoughts:  No  Memory:  Immediate;   Good  Judgement:  Good  Insight:  Good  Psychomotor Activity:  Psychomotor Retardation  Concentration:  Concentration: Fair  Recall:  [place] of Knowledge: Good  Language: Good  Akathisia:  Negative  Handed:  Right  AIMS (if indicated): done - 0  Assets:  Communication Skills Desire for Improvement Financial Resources/Insurance Housing Resilience Social Support Talents/Skills Transportation Vocational/Educational  ADL's:  Intact  Cognition: WNL  Sleep:  Good   Screenings:   Assessment and Plan:  Lyris Hitchman is a 61 year old female with major depressive disorder with catatonic features, symptoms currently stable.  She remains with some ongoing psychomotor depression and slowing, and given the severity of her catatonia, and the upcoming holidays, I would like to maintain the current regimen over the next few months before we begin tapering.  I completed an aims today, and she scored a 0.  I spent time educating her on the risk of TD and the deleterious effects of antipsychotics on cholesterol and weight with long-term use.  Overall, the patient has had a gradual improvement of her depressive symptoms and complex grief.  She has had a tremendously supportive social system, and continues to live with her brother for now.  She is been participating in more social activities, and p.o. intake has been better, as evidenced by the improved weight gain over the past 3 months.  1. Severe episode of recurrent major depressive disorder, without psychotic features ([place])   2. Catatonia     Status of current problems: gradually  improving  Labs Ordered: No orders of the defined types were placed in this encounter.   Labs Reviewed: NA  Collateral Obtained/Records Reviewed: N/A  Plan:  Continue Effexor 300 mg daily Continue olanzapine 5 mg nightly Continue Ativan 2 mg 3 times a day Return to clinic in 6-8 weeks Continue in individual and group therapy Encouraged patient to consider volunteer activities  I spent 30 minutes with the patient in direct face-to-face clinical care.  Greater than 50% of this time was spent in counseling and coordination of care with the patient.    Aundra Dubin, MD 10/22/2016, 4:09 PM

## 2016-10-25 ENCOUNTER — Encounter (HOSPITAL_COMMUNITY): Payer: Self-pay | Admitting: Psychiatry

## 2016-10-30 NOTE — Telephone Encounter (Signed)
Hey donna, can you put Miss Inza in on Thursday at 12 noon. Thank you!

## 2016-11-01 ENCOUNTER — Ambulatory Visit (INDEPENDENT_AMBULATORY_CARE_PROVIDER_SITE_OTHER): Payer: BLUE CROSS/BLUE SHIELD | Admitting: Psychiatry

## 2016-11-01 DIAGNOSIS — F332 Major depressive disorder, recurrent severe without psychotic features: Secondary | ICD-10-CM | POA: Diagnosis not present

## 2016-11-01 DIAGNOSIS — F061 Catatonic disorder due to known physiological condition: Secondary | ICD-10-CM | POA: Diagnosis not present

## 2016-11-01 NOTE — Progress Notes (Signed)
[facility] MD/PA/NP OP Progress Note  11/01/2016 12:28 PM Brianna Ross  MRN:  [id number]  Chief Complaint: Brief check-in, recent stressors HPI: Patient presents with her friend today, given concerns about some of the recent stressors.  Patient has moved out of her brother's house, due to brother very kindly and gently sharing with the patient that her staying with them for the past 4 months needs to come to an end.  It has added strain to their family system.  The patient was very understanding about this and is now living in a independent retirement community in [place], about 20 minutes from her brother.  The first 1 or 2 days, the patient had some increased staring episodes and was a little bit less verbal.  They wanted to check in today to make sure things had resolved.  This change happened last week.  The patient's friend, Otis Peak, is here in office.  Brianna Ross reports that she is feeling much better, she was in a bit of shock after her brother told her that, but completely understands where he is coming from.  She is settling into her new living situation nicely, and has a 54-month contract, with an option to renew monthly.  She denies any suicidal thoughts, and has not had any psychotic symptoms.  She did have some speech latency and reduced blinking but this has resolved.  She continues on the current medications as discussed at the last visit.  We spent time discussing interventions including increased individual therapy weekly, but for a short period during this transition, and also introducing a UV light in the mornings to help with energy and depressive symptoms.  The patient is quite opposed to revisiting the idea of [place] or ECT, but is open to reintroducing Remeron if these other interventions do not work.  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 Severe episode of recurrent major depressive disorder, without psychotic features ([place]) F33.2     Past Psychiatric History: See  intake H&P for full details. Reviewed, with no updates at this time.   Past Medical History:  Past Medical History:  Diagnosis Date  . Abnormal stress electrocardiogram test   . Allergy   . ANA positive   . Atypical nevi   . Depression   . Headache(784.0)   . Hives 2011   x5  . Hyperlipidemia   . Menopause   . OSA (obstructive sleep apnea)   . Right wrist fracture   . Rosacea   . Shingles 03/2012  . Shortness of breath   . Sleep apnea   . Urticaria     Past Surgical History:  Procedure Laterality Date  . ORIF WRIST FRACTURE    . thymic cyst     removal  . WISDOM TOOTH EXTRACTION      Family Psychiatric History: See intake H&P for full details. Reviewed, with no updates at this time.   Family History:  Family History  Problem Relation Age of Onset  . Glaucoma Mother   . Hyperlipidemia Mother   . Depression Mother   . Osteoporosis Mother   . Heart disease Father   . Hyperlipidemia Father   . Hypertension Father   . Heart defect Sister   . Heart disease Sister 13       congenital heart defect,deceased  . Hyperlipidemia Brother   . Depression Brother   . High Cholesterol Brother   . Cancer Paternal Grandmother   . Stroke Paternal Grandfather   .  Heart disease Paternal Grandfather     Social History:  Social History   Social History  . Marital status: Married    Spouse name: Sonny  . Number of children: 0  . Years of education: 60   Occupational History  .  [employer]   Social History Main Topics  . Smoking status: Never Smoker  . Smokeless tobacco: Never Used  . Alcohol use No     Comment: rarely  . Drug use: No  . Sexual activity: Not Currently    Birth control/ protection: Post-menopausal   Other Topics Concern  . Not on file   Social History Narrative   Patient works for [employer].(full-time_    Patient lives at home with her husband [name]).   Patient does not have any children.   Patient is  right-handed.   Patient drinks one cup of coffee everyday and two cups of tea daily.    Allergies: No Known Allergies  Metabolic Disorder Labs: No results found for: HGBA1C, MPG No results found for: PROLACTIN Lab Results  Component Value Date   CHOL 188 11/13/2013   TRIG 82 11/13/2013   HDL 68 11/13/2013   CHOLHDL 2.8 11/13/2013   VLDL 16 11/13/2013   LDLCALC 104 (H) 11/13/2013   LDLCALC 129 (H) 11/03/2012   Lab Results  Component Value Date   TSH 1.684 11/13/2013   TSH 1.092 11/03/2012    Therapeutic Level Labs: No results found for: LITHIUM No results found for: VALPROATE No components found for:  CBMZ  Current Medications: Current Outpatient Prescriptions  Medication Sig Dispense Refill  . acetaminophen (TYLENOL) 500 MG tablet Take 500 mg by mouth 2 (two) times daily as needed (pain).    Marland Kitchen aspirin EC 81 MG tablet Take 81 mg by mouth every other day.     . fluorometholone (FML) 0.1 % ophthalmic suspension Place 1 drop into the left eye daily.    . hydroxychloroquine (PLAQUENIL) 200 MG tablet TAKE 1 TABLET AT BEDTIME 90 tablet 1  . LORazepam (ATIVAN) 1 MG tablet TAKE 2 TABLETS BY MOUTH 3 TIMES A DAY AS NEEDED 180 tablet 0  . OLANZapine (ZYPREXA) 5 MG tablet 5 mg qhs 90 tablet 1  . valACYclovir (VALTREX) 1000 MG tablet Take 1,000 mg by mouth.    . venlafaxine XR (EFFEXOR-XR) 150 MG 24 hr capsule Take 2 capsules (300 mg total) by mouth daily. 180 capsule 1   No current facility-administered medications for this visit.      Musculoskeletal: Strength & Muscle Tone: within normal limits Gait & Station: normal Patient leans: N/A  Psychiatric Specialty Exam: ROS  There were no vitals taken for this visit.There is no height or weight on file to calculate BMI.  General Appearance: Casual and Fairly Groomed  Eye Contact:  Fair  Speech:  Clear and Coherent  Volume:  Normal  Mood:  Dysphoric  Affect:  Congruent and Flat  Thought Process:  Goal Directed and  Descriptions of Associations: Intact  Orientation:  Full (Time, Place, and Person)  Thought Content: Logical   Suicidal Thoughts:  No  Homicidal Thoughts:  No  Memory:  Immediate;   Good  Judgement:  Good  Insight:  Good  Psychomotor Activity:  Normal  Concentration:  Concentration: Good  Recall:  Good  Fund of Knowledge: Good  Language: Good  Akathisia:  Negative  Handed:  Right  AIMS (if indicated): not done  Assets:  Communication Skills Desire for Improvement Financial Resources/Insurance Housing  ADL's:  Intact  Cognition: WNL  Sleep:  Good   Screenings:   Assessment and Plan:  Brianna Ross is a 61 year old female with severe major depressive disorder with catatonia.  Her symptoms appear to be generally well controlled, and she did have a minor flare in catatonia symptoms last week in the setting of external stressors as above.  Overall, her antidepressant regimen appears to be fairly robust in preventing deep returns into depression.  She continues on her medication regimen as below, and is actively engaged in individual therapy.  To assist with some of the recent stressors, we have discussed with behavioral strategies and therapy strategies, in addition to the potential medication options for augmentation.  1. Catatonia   2. Severe episode of recurrent major depressive disorder, without psychotic features ([place])     Status of current problems: stable  Labs Ordered: No orders of the defined types were placed in this encounter.   Labs Reviewed: NA  Collateral Obtained/Records Reviewed: N/A  Plan:  Continue Effexor 300 mg daily Continue olanzapine 5 mg nightly If patient's symptoms worsen, introduce Remeron 15 mg nightly Continue Ativan 2 mg 3 times a day Return to clinic in 6-8 weeks Continue in individual and group therapy; recommend increase in individual therapy visits Recommend initiation of daily UV light exposure in the morning Encouraged patient to  consider volunteer activities  I spent 25 minutes with the patient in direct face-to-face clinical care.  Greater than 50% of this time was spent in counseling and coordination of care with the patient.    Aundra Dubin, MD 11/01/2016, 12:28 PM

## 2016-11-02 ENCOUNTER — Ambulatory Visit (INDEPENDENT_AMBULATORY_CARE_PROVIDER_SITE_OTHER): Payer: BLUE CROSS/BLUE SHIELD | Admitting: Psychology

## 2016-11-02 DIAGNOSIS — F332 Major depressive disorder, recurrent severe without psychotic features: Secondary | ICD-10-CM | POA: Diagnosis not present

## 2016-11-26 ENCOUNTER — Ambulatory Visit (INDEPENDENT_AMBULATORY_CARE_PROVIDER_SITE_OTHER): Payer: BLUE CROSS/BLUE SHIELD | Admitting: Psychology

## 2016-11-26 DIAGNOSIS — H11823 Conjunctivochalasis, bilateral: Secondary | ICD-10-CM | POA: Diagnosis not present

## 2016-11-26 DIAGNOSIS — F332 Major depressive disorder, recurrent severe without psychotic features: Secondary | ICD-10-CM | POA: Diagnosis not present

## 2016-11-26 DIAGNOSIS — Z8669 Personal history of other diseases of the nervous system and sense organs: Secondary | ICD-10-CM | POA: Diagnosis not present

## 2016-11-26 DIAGNOSIS — H2513 Age-related nuclear cataract, bilateral: Secondary | ICD-10-CM | POA: Diagnosis not present

## 2016-11-26 DIAGNOSIS — B0239 Other herpes zoster eye disease: Secondary | ICD-10-CM | POA: Diagnosis not present

## 2016-12-05 ENCOUNTER — Other Ambulatory Visit (HOSPITAL_COMMUNITY): Payer: Self-pay

## 2016-12-05 DIAGNOSIS — F061 Catatonic disorder due to known physiological condition: Secondary | ICD-10-CM

## 2016-12-05 DIAGNOSIS — F332 Major depressive disorder, recurrent severe without psychotic features: Secondary | ICD-10-CM

## 2016-12-05 MED ORDER — LORAZEPAM 1 MG PO TABS: ORAL_TABLET | ORAL | 0 refills | Status: DC

## 2016-12-10 ENCOUNTER — Ambulatory Visit: Payer: BLUE CROSS/BLUE SHIELD | Admitting: Psychology

## 2016-12-12 ENCOUNTER — Encounter (HOSPITAL_COMMUNITY): Payer: Self-pay | Admitting: Psychiatry

## 2016-12-14 ENCOUNTER — Ambulatory Visit (HOSPITAL_COMMUNITY): Payer: BLUE CROSS/BLUE SHIELD | Admitting: Psychiatry

## 2016-12-14 ENCOUNTER — Encounter (HOSPITAL_COMMUNITY): Payer: Self-pay | Admitting: Psychiatry

## 2016-12-14 DIAGNOSIS — F061 Catatonic disorder due to known physiological condition: Secondary | ICD-10-CM | POA: Diagnosis not present

## 2016-12-14 DIAGNOSIS — F332 Major depressive disorder, recurrent severe without psychotic features: Secondary | ICD-10-CM | POA: Diagnosis not present

## 2016-12-14 DIAGNOSIS — Z818 Family history of other mental and behavioral disorders: Secondary | ICD-10-CM | POA: Diagnosis not present

## 2016-12-14 MED ORDER — LORAZEPAM 1 MG PO TABS: ORAL_TABLET | ORAL | 2 refills | Status: DC

## 2016-12-14 NOTE — Progress Notes (Signed)
BH MD/PA/NP OP Progress Note  12/14/2016 11:25 AM Brianna Ross  MRN:  [id number]  Chief Complaint: Doing much better  HPI: Patient presents today for medication management for chronic catatonia and major depressive disorder.  She presents with fairly fluid movements, some latency and blinking, speech is far more fluid and consistent.  She reports that she is actually doing better than last visit and is adjusting to the retirement community well.  She has made some friends, but admits that she is a bit frustrated that many people there are older than her.  She spent Thanksgiving with her mom and with her brother And sister-in-law.  She reports that she is sleeping well every night.  She denies any suicidal thoughts.  She spent time sharing some of the books she is reading, and reflecting on her hope to be able to return to work in the coming year.  She and I agreed on a plan to begin tapering her Ativan in the spring when she follows up with this Probation officer and, as long as her mood has remained stable and there are no additional stressors.  She continues to follow up with Trey Paula for individual therapy and is completing a 13-week grief group next week, she will be rejoining the grief group in January to repeat the entire series.  She reports that many people have said that they gain even more for him at the second time around.  She continues to grieve in her own time and has waves of sadness, agrees that time is the most relevant ingredient.  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 Severe episode of recurrent major depressive disorder, without psychotic features ([place]) F33.2 LORazepam (ATIVAN) 1 MG tablet    Past Psychiatric History: See intake H&P for full details. Reviewed, with no updates at this time.   Past Medical History:  Past Medical History:  Diagnosis Date  . Abnormal stress electrocardiogram test   . Allergy   . ANA positive   . Atypical  nevi   . Depression   . Headache(784.0)   . Hives 2011   x5  . Hyperlipidemia   . Menopause   . OSA (obstructive sleep apnea)   . Right wrist fracture   . Rosacea   . Shingles 03/2012  . Shortness of breath   . Sleep apnea   . Urticaria     Past Surgical History:  Procedure Laterality Date  . ORIF WRIST FRACTURE    . thymic cyst     removal  . WISDOM TOOTH EXTRACTION      Family Psychiatric History: See intake H&P for full details. Reviewed, with no updates at this time.   Family History:  Family History  Problem Relation Age of Onset  . Glaucoma Mother   . Hyperlipidemia Mother   . Depression Mother   . Osteoporosis Mother   . Heart disease Father   . Hyperlipidemia Father   . Hypertension Father   . Heart defect Sister   . Heart disease Sister 13       congenital heart defect,deceased  . Hyperlipidemia Brother   . Depression Brother   . High Cholesterol Brother   . Cancer Paternal Grandmother   . Stroke Paternal Grandfather   . Heart disease Paternal Grandfather     Social History:  Social History   Socioeconomic History  . Marital status: Married    Spouse name: Sonny  . Number of  children: 0  . Years of education: 24  . Highest education level: Not on file  Social Needs  . Financial resource strain: Not on file  . Food insecurity - worry: Not on file  . Food insecurity - inability: Not on file  . Transportation needs - medical: Not on file  . Transportation needs - non-medical: Not on file  Occupational History    Employer: [employer]  Tobacco Use  . Smoking status: Never Smoker  . Smokeless tobacco: Never Used  Substance and Sexual Activity  . Alcohol use: No    Comment: rarely  . Drug use: No  . Sexual activity: Not Currently    Birth control/protection: Post-menopausal  Other Topics Concern  . Not on file  Social History Narrative   Patient works for [employer].(full-time_    Patient lives at  home with her husband [name]).   Patient does not have any children.   Patient is right-handed.   Patient drinks one cup of coffee everyday and two cups of tea daily.    Allergies: No Known Allergies  Metabolic Disorder Labs: No results found for: HGBA1C, MPG No results found for: PROLACTIN Lab Results  Component Value Date   CHOL 188 11/13/2013   TRIG 82 11/13/2013   HDL 68 11/13/2013   CHOLHDL 2.8 11/13/2013   VLDL 16 11/13/2013   LDLCALC 104 (H) 11/13/2013   LDLCALC 129 (H) 11/03/2012   Lab Results  Component Value Date   TSH 1.684 11/13/2013   TSH 1.092 11/03/2012    Therapeutic Level Labs: No results found for: LITHIUM No results found for: VALPROATE No components found for:  CBMZ  Current Medications: Current Outpatient Medications  Medication Sig Dispense Refill  . acetaminophen (TYLENOL) 500 MG tablet Take 500 mg by mouth 2 (two) times daily as needed (pain).    Marland Kitchen aspirin EC 81 MG tablet Take 81 mg by mouth every other day.     . fluorometholone (FML) 0.1 % ophthalmic suspension Place 1 drop into the left eye daily.    . hydroxychloroquine (PLAQUENIL) 200 MG tablet TAKE 1 TABLET AT BEDTIME 90 tablet 1  . LORazepam (ATIVAN) 1 MG tablet TAKE 2 TABLETS BY MOUTH 3 TIMES A DAY AS NEEDED 180 tablet 2  . OLANZapine (ZYPREXA) 5 MG tablet 5 mg qhs 90 tablet 1  . valACYclovir (VALTREX) 1000 MG tablet Take 1,000 mg by mouth.    . venlafaxine XR (EFFEXOR-XR) 150 MG 24 hr capsule Take 2 capsules (300 mg total) by mouth daily. 180 capsule 1   No current facility-administered medications for this visit.      Musculoskeletal: Strength & Muscle Tone: within normal limits Gait & Station: normal Patient leans: N/A  Psychiatric Specialty Exam: ROS  There were no vitals taken for this visit.There is no height or weight on file to calculate BMI.  General Appearance: Casual and Fairly Groomed  Eye Contact:  Good  Speech:  Clear and Coherent  Volume:  Normal  Mood:   Euthymic  Affect:  Appropriate and Congruent  Thought Process:  Goal Directed and Descriptions of Associations: Intact  Orientation:  Full (Time, Place, and Person)  Thought Content: Logical   Suicidal Thoughts:  No  Homicidal Thoughts:  No  Memory:  Immediate;   Fair  Judgement:  Good  Insight:  Good  Psychomotor Activity:  slow, improved from last visit  Concentration:  Concentration: Good  Recall:  [place] of Knowledge: Good  Language: Good  Akathisia:  Negative  Handed:  Right  AIMS (if indicated): not done  Assets:  Communication Skills Desire for Improvement Financial Resources/Insurance Housing Physical Health Resilience Social Support Transportation  ADL's:  Intact  Cognition: WNL  Sleep:  Good   Screenings:   Assessment and Plan:  Edeline Greening presents with improved mood feeling more stable as compared to our last visit.  She continues on lorazepam 2 mg 3 times a day, Zyprexa, Effexor.  She has incorporated the UV sunlight into her daily regimen for about an hour every morning and does feel that this is also helping prevent seasonal worsening of her depression.  She is adjusting to her new living situation in the retirement community and recognizes that this is likely a temporary move so she can figure out what to do with her home in terms of selling it and/or purchasing a different home.  She continues to participate in therapy and grief processing, and her tremendous resilience has proved quite protective for her, in addition to the wonderful support system she continues to have.  1. Catatonia   2. Severe episode of recurrent major depressive disorder, without psychotic features ([place])     Status of current problems: stable  Labs Ordered: No orders of the defined types were placed in this encounter.   Labs Reviewed: NA  Collateral Obtained/Records Reviewed: N/A  Plan:  Continue Lorazepam 2 mg 3 times a day Continue Zyprexa 5 mg nightly Continue Effexor  300 mg XR daily RTC 10 weeks  I spent 20 minutes with the patient in direct face-to-face clinical care.  Greater than 50% of this time was spent in counseling and coordination of care with the patient.    Aundra Dubin, MD 12/14/2016, 11:25 AM

## 2016-12-18 ENCOUNTER — Ambulatory Visit (INDEPENDENT_AMBULATORY_CARE_PROVIDER_SITE_OTHER): Payer: BLUE CROSS/BLUE SHIELD | Admitting: Psychology

## 2016-12-18 DIAGNOSIS — F332 Major depressive disorder, recurrent severe without psychotic features: Secondary | ICD-10-CM

## 2016-12-27 ENCOUNTER — Ambulatory Visit: Payer: BLUE CROSS/BLUE SHIELD | Admitting: Psychology

## 2017-01-03 DIAGNOSIS — H2513 Age-related nuclear cataract, bilateral: Secondary | ICD-10-CM | POA: Diagnosis not present

## 2017-01-03 DIAGNOSIS — H25042 Posterior subcapsular polar age-related cataract, left eye: Secondary | ICD-10-CM | POA: Diagnosis not present

## 2017-01-03 DIAGNOSIS — H5213 Myopia, bilateral: Secondary | ICD-10-CM | POA: Diagnosis not present

## 2017-01-03 DIAGNOSIS — Z79899 Other long term (current) drug therapy: Secondary | ICD-10-CM | POA: Diagnosis not present

## 2017-01-13 NOTE — Progress Notes (Signed)
Office Visit Note  Patient: Brianna Ross             Date of Birth: [birth year]           MRN: [id number]             PCP: Lanice Shirts, MD Referring: Lanice Shirts, * Visit Date: 01/25/2017 Occupation: @GUAROCC @    Subjective:  Other (doing good )   History of Present Illness: Brianna Ross is a 62 y.o. female with history of autoimmune disease. She states she has been doing well. She denies any sicca symptoms. She does not have any joint pain currently. She's been taking Plaquenil one tablet every other day along with aspirin every other day. Her depression persists. She states she's been seeing a psychiatrist.  Activities of Daily Living:  Patient reports morning stiffness for 0 minute.   Patient Denies nocturnal pain.  Difficulty dressing/grooming: Denies Difficulty climbing stairs: Denies Difficulty getting out of chair: Denies Difficulty using hands for taps, buttons, cutlery, and/or writing: Denies   Review of Systems  Constitutional: Negative for fatigue, night sweats, weight gain, weight loss and weakness.  HENT: Negative for mouth sores, trouble swallowing, trouble swallowing, mouth dryness and nose dryness.   Eyes: Negative for pain, redness, visual disturbance and dryness.  Respiratory: Negative for cough, shortness of breath and difficulty breathing.   Cardiovascular: Negative for chest pain, palpitations, hypertension, irregular heartbeat and swelling in legs/feet.  Gastrointestinal: Negative for blood in stool, constipation and diarrhea.  Endocrine: Negative for increased urination.  Genitourinary: Negative for vaginal dryness.  Musculoskeletal: Negative for arthralgias, joint pain, joint swelling, myalgias, muscle weakness, morning stiffness, muscle tenderness and myalgias.  Skin: Negative for color change, rash, hair loss, skin tightness, ulcers and sensitivity to sunlight.  Allergic/Immunologic: Negative for susceptible to infections.  Neurological:  Negative for dizziness, memory loss and night sweats.  Hematological: Negative for swollen glands.  Psychiatric/Behavioral: Positive for depressed mood. Negative for sleep disturbance. The patient is not nervous/anxious.     PMFS History:  Patient Active Problem List   Diagnosis Date Noted  . Primary osteoarthritis of both hands 02/27/2016  . High risk medication use 02/20/2016  . Autoimmune disease (HCC)positive ANA, hypocomplementemia, positive beta 2 and positive anticardiolipin antibodies.  02/20/2016  . Sicca syndrome, unspecified ([place]) 02/20/2016  . History of sleep apnea 02/20/2016  . Severe major depression without psychotic features ([place]) 02/24/2015  . OSA on CPAP 08/11/2014  . Hearing loss 06/29/2014  . OSA treated with BiPAP 08/06/2013  . Hyperlipidemia 04/29/2013  . 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 Family history of heart disease 12/10/2012  . 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 Positive ANA (antinuclear antibody) 03/06/2012  . Elevated aldolase level 03/06/2012  . Atypical nevi 11/14/2011  . Menopause 11/13/2011  . Rosacea 11/13/2011    Past Medical History:  Diagnosis Date  . Abnormal stress electrocardiogram test   . Allergy   . ANA positive   . Atypical nevi   . Depression   . Headache(784.0)   . Hives 2011   x5  . Hyperlipidemia   . Menopause   . OSA (obstructive sleep apnea)   . Right wrist fracture   . Rosacea   . Shingles 03/2012  . Shortness of breath   . Sleep apnea   . Urticaria     Family History  Problem Relation Age of Onset  . Glaucoma Mother   . Hyperlipidemia Mother   . Depression Mother   .  Osteoporosis Mother   . Heart disease Father   . Hyperlipidemia Father   . Hypertension Father   . Heart defect Sister   . Heart disease Sister 13       congenital heart defect,deceased  . Hyperlipidemia Brother   . Depression Brother   . High Cholesterol Brother   . Cancer Paternal Grandmother     . Stroke Paternal Grandfather   . Heart disease Paternal Grandfather    Past Surgical History:  Procedure Laterality Date  . ORIF WRIST FRACTURE    . thymic cyst     removal  . WISDOM TOOTH EXTRACTION     Social History   Social History Narrative   Patient works for [employer].(full-time_    Patient lives at home with her husband Brianna Ross).   Patient does not have any children.   Patient is right-handed.   Patient drinks one cup of coffee everyday and two cups of tea daily.     Objective: Vital Signs: BP 123/85 (BP Location: Left Arm, Patient Position: Sitting, Cuff Size: Normal)   Pulse 100   Resp 16   Ht 5' 5"  (1.651 m)   Wt 151 lb (68.5 kg)   BMI 25.13 kg/m    Physical Exam  Constitutional: She is oriented to person, place, and time. She appears well-developed and well-nourished.  HENT:  Head: Normocephalic and atraumatic.  Eyes: Conjunctivae and EOM are normal.  Neck: Normal range of motion.  Cardiovascular: Normal rate, regular rhythm, normal heart sounds and intact distal pulses.  Pulmonary/Chest: Effort normal and breath sounds normal.  Abdominal: Soft. Bowel sounds are normal.  Lymphadenopathy:    She has no cervical adenopathy.  Neurological: She is alert and oriented to person, place, and time.  Skin: Skin is warm and dry. Capillary refill takes less than 2 seconds.  Psychiatric: She has a normal mood and affect. Her behavior is normal.  Nursing note and vitals reviewed.    Musculoskeletal Exam: C-spine and thoracic lumbar spine good range of motion. Shoulder joints elbow joints wrist joint MCPs PIPs DIPs with good range of motion. She has some DIP PIP thickening in her hands consistent with osteoarthritis. Hip joints knee joints ankles MTPs PIPs with good range of motion with no synovitis.  CDAI Exam: No CDAI exam completed.    Investigation: No additional findings.PLQ eye exam: 01/03/2017 CBC Latest Ref Rng & Units 09/25/2016  04/25/2016 11/07/2015  WBC 3.8 - 10.8 Thousand/uL 5.8 9.3 4.6  Hemoglobin 11.7 - 15.5 g/dL 13.8 16.2(H) 14.1  Hematocrit 35.0 - 45.0 % 40.0 47.0(H) 41.8  Platelets 140 - 400 Thousand/uL 239 322 254   CMP Latest Ref Rng & Units 09/25/2016 04/25/2016 11/07/2015  Glucose 65 - 99 mg/dL 86 96 79  BUN 7 - 25 mg/dL 14 8 10   Creatinine 0.50 - 0.99 mg/dL 0.67 0.73 0.72  Sodium 135 - 146 mmol/L 140 139 140  Potassium 3.5 - 5.3 mmol/L 4.0 3.9 4.0  Chloride 98 - 110 mmol/L 104 99 102  CO2 20 - 32 mmol/L 30 22 30   Calcium 8.6 - 10.4 mg/dL 9.2 10.2 9.9  Total Protein 6.1 - 8.1 g/dL 5.9(L) 6.7 6.5  Total Bilirubin 0.2 - 1.2 mg/dL 0.4 0.6 0.5  Alkaline Phos 33 - 130 U/L - 48 51  AST 10 - 35 U/L 18 15 18   ALT 6 - 29 U/L 26 12 19   UA negative, ESR 2, C4 low at 14, beta-2 GP 121, anticardiolipin IgG 23, DS DNA  negative  Imaging: No results found.  Speciality Comments: PLQ Eye Exam: 01/03/17 WNL @ [facility]    Procedures:  No procedures performed Allergies: Patient has no known allergies.   Assessment / Plan:     Visit Diagnoses: Autoimmune disease (HCC)positive ANA, hypocomplementemia, positive beta 2 and positive anticardiolipin antibodies. She is clinically doing well without any problems. She denies any sicca symptoms currently.  High risk medication use - PLQ 200 mg by mouth every other day. An aspirin every other day. eye exam: 01/03/2017. We had detailed discussion regarding Plaquenil. She would like to come off the medication. I was in agreement she will continue aspirin and will discontinue Plaquenil. I will check her autoimmune labs when she comes back for follow-up visit.  Sicca syndrome, unspecified [facility]): Doing better  Primary osteoarthritis of both hands: Joint protection discussed.  Severe major depression without psychotic features [facility]): She continues to have significant depression and has been seen by psychiatrist.  History of sleep apnea  Rosacea  History of herpes  zoster keratoconjunctivitis  History of hyperlipidemia    Orders: No orders of the defined types were placed in this encounter.  No orders of the defined types were placed in this encounter.   Follow-Up Instructions: Return in about 6 months (around 07/25/2017) for Autoimmune disease.   Bo Merino, MD  Note - This record has been created using Editor, commissioning.  Chart creation errors have been sought, but may not always  have been located. Such creation errors do not reflect on  the standard of medical care.

## 2017-01-17 ENCOUNTER — Ambulatory Visit (INDEPENDENT_AMBULATORY_CARE_PROVIDER_SITE_OTHER): Payer: BLUE CROSS/BLUE SHIELD | Admitting: Psychology

## 2017-01-17 DIAGNOSIS — F332 Major depressive disorder, recurrent severe without psychotic features: Secondary | ICD-10-CM | POA: Diagnosis not present

## 2017-01-25 ENCOUNTER — Encounter: Payer: Self-pay | Admitting: Rheumatology

## 2017-01-25 ENCOUNTER — Ambulatory Visit: Payer: BLUE CROSS/BLUE SHIELD | Admitting: Rheumatology

## 2017-01-25 VITALS — BP 123/85 | HR 100 | Resp 16 | Ht 65.0 in | Wt 151.0 lb

## 2017-01-25 DIAGNOSIS — D8989 Other specified disorders involving the immune mechanism, not elsewhere classified: Secondary | ICD-10-CM | POA: Diagnosis not present

## 2017-01-25 DIAGNOSIS — F322 Major depressive disorder, single episode, severe without psychotic features: Secondary | ICD-10-CM

## 2017-01-25 DIAGNOSIS — Z79899 Other long term (current) drug therapy: Secondary | ICD-10-CM | POA: Diagnosis not present

## 2017-01-25 DIAGNOSIS — L719 Rosacea, unspecified: Secondary | ICD-10-CM | POA: Diagnosis not present

## 2017-01-25 DIAGNOSIS — Z8619 Personal history of other infectious and parasitic diseases: Secondary | ICD-10-CM

## 2017-01-25 DIAGNOSIS — Z8639 Personal history of other endocrine, nutritional and metabolic disease: Secondary | ICD-10-CM

## 2017-01-25 DIAGNOSIS — M359 Systemic involvement of connective tissue, unspecified: Secondary | ICD-10-CM

## 2017-01-25 DIAGNOSIS — Z8669 Personal history of other diseases of the nervous system and sense organs: Secondary | ICD-10-CM

## 2017-01-25 DIAGNOSIS — M19041 Primary osteoarthritis, right hand: Secondary | ICD-10-CM | POA: Diagnosis not present

## 2017-01-25 DIAGNOSIS — M3500 Sicca syndrome, unspecified: Secondary | ICD-10-CM

## 2017-01-25 DIAGNOSIS — M19042 Primary osteoarthritis, left hand: Secondary | ICD-10-CM | POA: Diagnosis not present

## 2017-01-29 ENCOUNTER — Ambulatory Visit: Payer: BLUE CROSS/BLUE SHIELD | Admitting: Psychology

## 2017-01-29 DIAGNOSIS — F332 Major depressive disorder, recurrent severe without psychotic features: Secondary | ICD-10-CM | POA: Diagnosis not present

## 2017-02-19 ENCOUNTER — Ambulatory Visit: Payer: BLUE CROSS/BLUE SHIELD | Admitting: Psychology

## 2017-02-22 ENCOUNTER — Ambulatory Visit (INDEPENDENT_AMBULATORY_CARE_PROVIDER_SITE_OTHER): Payer: BLUE CROSS/BLUE SHIELD | Admitting: Psychiatry

## 2017-02-22 ENCOUNTER — Encounter (HOSPITAL_COMMUNITY): Payer: Self-pay | Admitting: Psychiatry

## 2017-02-22 DIAGNOSIS — Z818 Family history of other mental and behavioral disorders: Secondary | ICD-10-CM

## 2017-02-22 DIAGNOSIS — F332 Major depressive disorder, recurrent severe without psychotic features: Secondary | ICD-10-CM | POA: Diagnosis not present

## 2017-02-22 DIAGNOSIS — F061 Catatonic disorder due to known physiological condition: Secondary | ICD-10-CM | POA: Diagnosis not present

## 2017-02-22 DIAGNOSIS — Z803 Family history of malignant neoplasm of breast: Secondary | ICD-10-CM | POA: Diagnosis not present

## 2017-02-22 DIAGNOSIS — Z79899 Other long term (current) drug therapy: Secondary | ICD-10-CM | POA: Diagnosis not present

## 2017-02-22 DIAGNOSIS — Z1231 Encounter for screening mammogram for malignant neoplasm of breast: Secondary | ICD-10-CM | POA: Diagnosis not present

## 2017-02-22 MED ORDER — VENLAFAXINE HCL ER 150 MG PO CP24: 300.0000 mg | ORAL_CAPSULE | Freq: Every day | ORAL | 1 refills | Status: DC

## 2017-02-22 MED ORDER — OLANZAPINE 2.5 MG PO TABS: 2.5000 mg | ORAL_TABLET | Freq: Every day | ORAL | 0 refills | Status: DC

## 2017-02-22 MED ORDER — LORAZEPAM 1 MG PO TABS: ORAL_TABLET | ORAL | 2 refills | Status: DC

## 2017-02-22 NOTE — Patient Instructions (Signed)
Decrease zyprexa to 1/2 tablet for 4 weeks, then stop  If you have trouble with sleep, call us and we can send something in to help with sleep  Continue ativan and effexor as they are

## 2017-02-22 NOTE — Progress Notes (Signed)
BH MD/PA/NP OP Progress Note  02/22/2017 11:35 AM Brianna Ross  MRN:  [id number]  Chief Complaint:  doing pretty good  HPI: Brianna Ross has continued to show interval improvement. Has maintained good appetite and self care. Attending groups and individual therapy regularly. No si/hi/avh. Continues to live in retirement community. She feels like she is in a good place, we discussed decreasing zyprexa and follow-up in 5 weeks.  Discussed risks/benefits and consider TCA if needed for augmentation. Discussed we can begin taper ativan at follow-up.  She presents with small periods of starting but overall she is not grossly catatonic.   Visit Diagnosis:    ICD-10-CM   1. Severe episode of recurrent major depressive disorder, without psychotic features (HCC) F33.2 OLANZapine (ZYPREXA) 2.5 MG tablet    venlafaxine XR (EFFEXOR-XR) 150 MG 24 hr capsule    LORazepam (ATIVAN) 1 MG tablet  2. 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 Reviewed, with no updates at this time.   Past Medical History:  Past Medical History:  Diagnosis Date  . Abnormal stress electrocardiogram test   . Allergy   . ANA positive   . Atypical nevi   . Depression   . Headache(784.0)   . Hives 2011   x5  . Hyperlipidemia   . Menopause   . OSA (obstructive sleep apnea)   . Right wrist fracture   . Rosacea   . Shingles 03/2012  . Shortness of breath   . Sleep apnea   . Urticaria     Past Surgical History:  Procedure Laterality Date  . ORIF WRIST FRACTURE    . thymic cyst     removal  . WISDOM TOOTH EXTRACTION      Family Psychiatric History: See intake H&P for full details. Reviewed, with no updates at this time.   Family History:  Family History  Problem Relation Age of Onset  . Glaucoma Mother   . Hyperlipidemia Mother   . Depression Mother   . Osteoporosis Mother   . Heart disease  Father   . Hyperlipidemia Father   . Hypertension Father   . Heart defect Sister   . Heart disease Sister 13       congenital heart defect,deceased  . Hyperlipidemia Brother   . Depression Brother   . High Cholesterol Brother   . Cancer Paternal Grandmother   . Stroke Paternal Grandfather   . Heart disease Paternal Grandfather     Social History:  Social History   Socioeconomic History  . Marital status: Married    Spouse name: Sonny  . Number of children: 0  . Years of education: 59  . Highest education level: Not on file  Social Needs  . Financial resource strain: Not on file  . Food insecurity - worry: Not on file  . Food insecurity - inability: Not on file  . Transportation needs - medical: Not on file  . Transportation needs - non-medical: Not on file  Occupational History    Employer: [employer]  Tobacco Use  . Smoking status: Never Smoker  . Smokeless tobacco: Never Used  Substance and Sexual Activity  . Alcohol use: No    Comment: rarely  . Drug use: No  . Sexual activity: Not Currently    Birth control/protection: Post-menopausal  Other Topics Concern  . Not on file  Social History Narrative  Patient works for [employer].(full-time_    Patient lives at home with her husband [name]).   Patient does not have any children.   Patient is right-handed.   Patient drinks one cup of coffee everyday and two cups of tea daily.    Allergies: No Known Allergies  Metabolic Disorder Labs: No results found for: HGBA1C, MPG No results found for: PROLACTIN Lab Results  Component Value Date   CHOL 188 11/13/2013   TRIG 82 11/13/2013   HDL 68 11/13/2013   CHOLHDL 2.8 11/13/2013   VLDL 16 11/13/2013   LDLCALC 104 (H) 11/13/2013   LDLCALC 129 (H) 11/03/2012   Lab Results  Component Value Date   TSH 1.684 11/13/2013   TSH 1.092 11/03/2012    Therapeutic Level Labs: No results found for: LITHIUM No results found for:  VALPROATE No components found for:  CBMZ  Current Medications: Current Outpatient Medications  Medication Sig Dispense Refill  . acetaminophen (TYLENOL) 500 MG tablet Take 500 mg by mouth 2 (two) times daily as needed (pain).    Marland Kitchen aspirin EC 81 MG tablet Take 81 mg by mouth every other day.     . fluorometholone (FML) 0.1 % ophthalmic suspension Place 1 drop into the left eye daily.    . hydroxychloroquine (PLAQUENIL) 200 MG tablet TAKE 1 TABLET AT BEDTIME (Patient taking differently: TAKE 1 TABLET AT BEDTIME, every other day) 90 tablet 1  . LORazepam (ATIVAN) 1 MG tablet TAKE 2 TABLETS BY MOUTH 3 TIMES A DAY AS NEEDED 180 tablet 2  . OLANZapine (ZYPREXA) 2.5 MG tablet Take 1 tablet (2.5 mg total) by mouth at bedtime for 30 doses. 5 mg qhs 30 tablet 0  . valACYclovir (VALTREX) 1000 MG tablet Take 1,000 mg by mouth.    . venlafaxine XR (EFFEXOR-XR) 150 MG 24 hr capsule Take 2 capsules (300 mg total) by mouth daily. 180 capsule 1   No current facility-administered medications for this visit.      Musculoskeletal: Strength & Muscle Tone: within normal limits Gait & Station: normal Patient leans: N/A  Psychiatric Specialty Exam: ROS  There were no vitals taken for this visit.There is no height or weight on file to calculate BMI.  General Appearance: Casual and Fairly Groomed  Eye Contact:  Good  Speech:  Clear and Coherent and Normal Rate  Volume:  Normal  Mood:  Euthymic  Affect:  Appropriate and Congruent  Thought Process:  Goal Directed and Descriptions of Associations: Intact  Orientation:  Full (Time, Place, and Person)  Thought Content: Logical   Suicidal Thoughts:  No  Homicidal Thoughts:  No  Memory:  Recent;   Good  Judgement:  Good  Insight:  Good  Psychomotor Activity:  Normal  Concentration:  Concentration: Good  Recall:  Good  Fund of Knowledge: Good  Language: Good  Akathisia:  Negative  Handed:  Right  AIMS (if indicated): not done  Assets:  Communication  Skills Desire for Improvement Financial Resources/Insurance Housing Resilience Social Support Talents/Skills Transportation Vocational/Educational  ADL's:  Intact  Cognition: WNL  Sleep:  Good   Screenings:   Assessment and Plan:  [name] continues to present with stable improvement in symptoms.  Will begin tapering zyprexa given very deleterious metabolic profile.  Will follow-up in 5 weeks and consider TCA if needed for augmentation.  No gross catatonia symptoms, and we can begin to consider gradual taper down of ativan.  1. Severe episode of recurrent major depressive disorder, without psychotic features (  [place])   2. Catatonia     Status of current problems: stable  Labs Ordered: No orders of the defined types were placed in this encounter.   Labs Reviewed: n/a  Collateral Obtained/Records Reviewed: n/a  Plan:  Continue effexor and lorazepam as above Decrease zyprexa to 2.5 mg x 4 weeks then stop Rtc 5 weeks  I spent 20 minutes with the patient in direct face-to-face clinical care.  Greater than 50% of this time was spent in counseling and coordination of care with the patient.    Aundra Dubin, MD 02/22/2017, 11:35 AM

## 2017-02-25 IMAGING — CR DG ABDOMEN 1V
1 series · 1 of 1 positions shown · non-contrast
Comparison: None.

CLINICAL DATA: 59-year-old presenting with absence of bowel
movements for approximately 9 days.

EXAM:
ABDOMEN - 1 VIEW

[t abdomen supine]
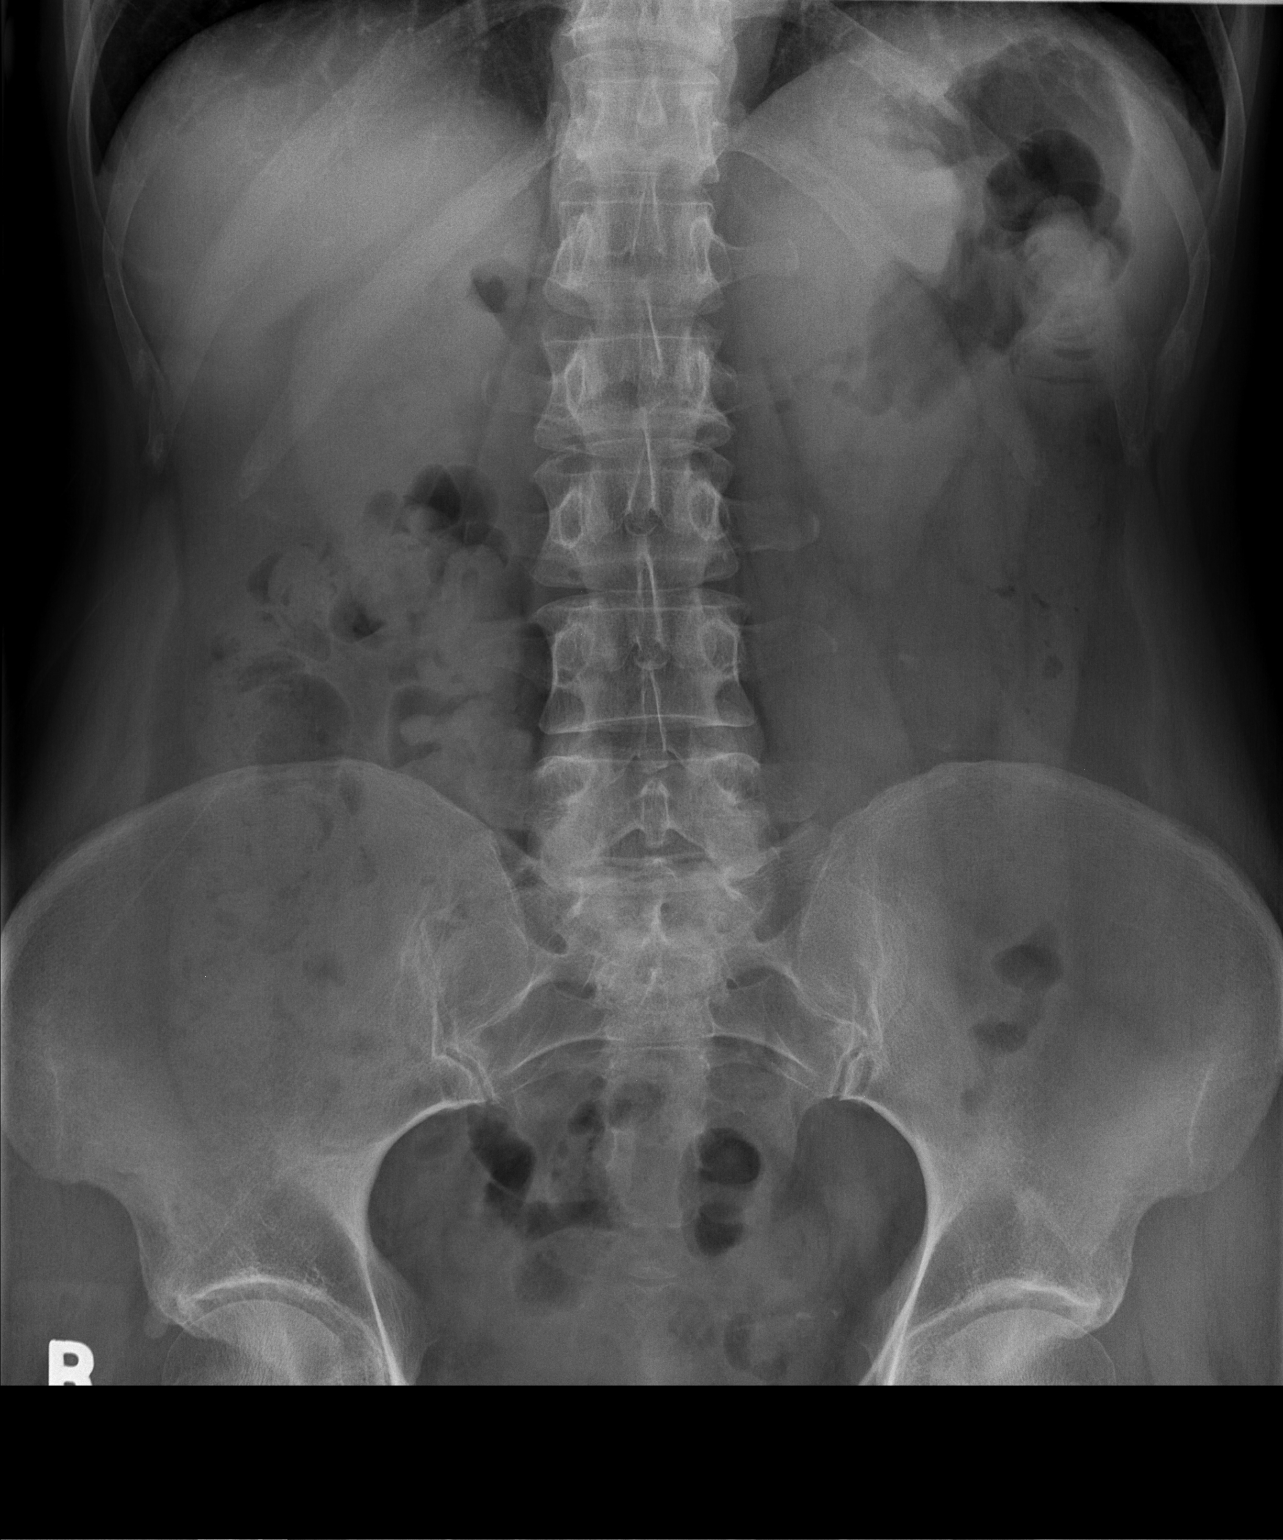

[1 of 1 positions shown; findings below may reference images not displayed]

FINDINGS: Bowel gas pattern unremarkable without evidence of obstruction or
significant ileus. Moderate stool burden in the colon. No visible
opaque urinary tract calculi. Calcifications in the left mid abdomen
are below the left kidney and may be phleboliths or calcified lymph
nodes. Regional skeleton intact.
IMPRESSION: No acute abdominal abnormality.  Moderate colonic stool burden.

## 2017-03-02 ENCOUNTER — Encounter (HOSPITAL_COMMUNITY): Payer: Self-pay | Admitting: Psychiatry

## 2017-03-04 ENCOUNTER — Other Ambulatory Visit (HOSPITAL_COMMUNITY): Payer: Self-pay | Admitting: Psychiatry

## 2017-03-04 DIAGNOSIS — F332 Major depressive disorder, recurrent severe without psychotic features: Secondary | ICD-10-CM

## 2017-03-04 MED ORDER — OLANZAPINE 2.5 MG PO TABS: 2.5000 mg | ORAL_TABLET | Freq: Every day | ORAL | 0 refills | Status: DC

## 2017-03-07 ENCOUNTER — Encounter (HOSPITAL_COMMUNITY): Payer: Self-pay | Admitting: Psychiatry

## 2017-03-13 ENCOUNTER — Ambulatory Visit (INDEPENDENT_AMBULATORY_CARE_PROVIDER_SITE_OTHER): Payer: BLUE CROSS/BLUE SHIELD | Admitting: Psychology

## 2017-03-13 DIAGNOSIS — F332 Major depressive disorder, recurrent severe without psychotic features: Secondary | ICD-10-CM | POA: Diagnosis not present

## 2017-03-16 ENCOUNTER — Encounter (HOSPITAL_COMMUNITY): Payer: Self-pay | Admitting: Psychiatry

## 2017-03-28 DIAGNOSIS — E785 Hyperlipidemia, unspecified: Secondary | ICD-10-CM | POA: Diagnosis not present

## 2017-03-28 DIAGNOSIS — R768 Other specified abnormal immunological findings in serum: Secondary | ICD-10-CM | POA: Diagnosis not present

## 2017-03-28 DIAGNOSIS — F329 Major depressive disorder, single episode, unspecified: Secondary | ICD-10-CM | POA: Diagnosis not present

## 2017-03-28 DIAGNOSIS — Z0000 Encounter for general adult medical examination without abnormal findings: Secondary | ICD-10-CM | POA: Diagnosis not present

## 2017-03-29 ENCOUNTER — Encounter (HOSPITAL_COMMUNITY): Payer: Self-pay | Admitting: Psychiatry

## 2017-03-29 ENCOUNTER — Ambulatory Visit (INDEPENDENT_AMBULATORY_CARE_PROVIDER_SITE_OTHER): Payer: BLUE CROSS/BLUE SHIELD | Admitting: Psychiatry

## 2017-03-29 ENCOUNTER — Ambulatory Visit (INDEPENDENT_AMBULATORY_CARE_PROVIDER_SITE_OTHER): Payer: BLUE CROSS/BLUE SHIELD | Admitting: Psychology

## 2017-03-29 DIAGNOSIS — Z818 Family history of other mental and behavioral disorders: Secondary | ICD-10-CM

## 2017-03-29 DIAGNOSIS — F332 Major depressive disorder, recurrent severe without psychotic features: Secondary | ICD-10-CM

## 2017-03-29 DIAGNOSIS — F061 Catatonic disorder due to known physiological condition: Secondary | ICD-10-CM

## 2017-03-29 MED ORDER — OLANZAPINE 5 MG PO TABS: 5.0000 mg | ORAL_TABLET | Freq: Every day | ORAL | 1 refills | Status: DC

## 2017-03-29 MED ORDER — LORAZEPAM 1 MG PO TABS: ORAL_TABLET | ORAL | 2 refills | Status: DC

## 2017-03-29 NOTE — Progress Notes (Signed)
BH MD/PA/NP OP Progress Note  03/29/2017 10:29 AM Brianna Ross  MRN:  [id number]  Chief Complaint: Doing okay HPI: Brianna Ross reports that she attempted to reduce the Zyprexa to 2.5 mg and go off of it for 3 weeks, but her depression seems to be a bit worse, she showed good insight and restarted the 5 mg dose.  She continues on Effexor and Zyprexa as prescribed.  She continues Ativan 2 mg 3 times a day, and tends to use less if she does not feel particularly anxious.  She does continue to present with chronic psychomotor slowing, and flattening of affect, but has made gradual improvements over the past year.  She is currently in the process of cleaning out her home, and giving her husband's belongings to charity and putting away some of the more valuable sentimental things.  This has been incredibly difficult for her, and she has good support from her family.  She will probably be putting her home up for sale in the market in the spring.  She denies any acute safety issues and continues in therapy with Mirna Mires, and will follow-up with this writer in 8 weeks.  Visit Diagnosis:    ICD-10-CM   1. Severe episode of recurrent major depressive disorder, without psychotic features (HCC) F33.2 OLANZapine (ZYPREXA) 5 MG tablet    LORazepam (ATIVAN) 1 MG tablet  2. 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 Reviewed, with no updates at this time.   Past Medical History:  Past Medical History:  Diagnosis Date  . Abnormal stress electrocardiogram test   . Allergy   . ANA positive   . Atypical nevi   . Depression   . Headache(784.0)   . Hives 2011   x5  . Hyperlipidemia   . Menopause   . OSA (obstructive sleep apnea)   . Right wrist fracture   . Rosacea   . Shingles 03/2012  . Shortness of breath   . Sleep apnea   . Urticaria     Past Surgical History:  Procedure Laterality Date  . ORIF WRIST FRACTURE    . thymic cyst      removal  . WISDOM TOOTH EXTRACTION      Family Psychiatric History: See intake H&P for full details. Reviewed, with no updates at this time.   Family History:  Family History  Problem Relation Age of Onset  . Glaucoma Mother   . Hyperlipidemia Mother   . Depression Mother   . Osteoporosis Mother   . Heart disease Father   . Hyperlipidemia Father   . Hypertension Father   . Heart defect Sister   . Heart disease Sister 13       congenital heart defect,deceased  . Hyperlipidemia Brother   . Depression Brother   . High Cholesterol Brother   . Cancer Paternal Grandmother   . Stroke Paternal Grandfather   . Heart disease Paternal Grandfather     Social History:  Social History   Socioeconomic History  . Marital status: Married    Spouse name: Sonny  . Number of children: 0  . Years of education: 18  . Highest education level: Not on file  Occupational History    Employer: [employer]  Social Needs  . Financial resource strain: Not on file  . Food insecurity:    Worry: Not on file    Inability: Not on file  . Transportation needs:  Medical: Not on file    Non-medical: Not on file  Tobacco Use  . Smoking status: Never Smoker  . Smokeless tobacco: Never Used  Substance and Sexual Activity  . Alcohol use: No    Comment: rarely  . Drug use: No  . Sexual activity: Not Currently    Birth control/protection: Post-menopausal  Lifestyle  . Physical activity:    Days per week: Not on file    Minutes per session: Not on file  . Stress: Not on file  Relationships  . Social connections:    Talks on phone: Not on file    Gets together: Not on file    Attends religious service: Not on file    Active member of club or organization: Not on file    Attends meetings of clubs or organizations: Not on file    Relationship status: Not on file  Other Topics Concern  . Not on file  Social History Narrative   Patient works for [employer].(full-time_    Patient lives at home with her husband [name]).   Patient does not have any children.   Patient is right-handed.   Patient drinks one cup of coffee everyday and two cups of tea daily.    Allergies: No Known Allergies  Metabolic Disorder Labs: No results found for: HGBA1C, MPG No results found for: PROLACTIN Lab Results  Component Value Date   CHOL 188 11/13/2013   TRIG 82 11/13/2013   HDL 68 11/13/2013   CHOLHDL 2.8 11/13/2013   VLDL 16 11/13/2013   LDLCALC 104 (H) 11/13/2013   LDLCALC 129 (H) 11/03/2012   Lab Results  Component Value Date   TSH 1.684 11/13/2013   TSH 1.092 11/03/2012    Therapeutic Level Labs: No results found for: LITHIUM No results found for: VALPROATE No components found for:  CBMZ  Current Medications: Current Outpatient Medications  Medication Sig Dispense Refill  . acetaminophen (TYLENOL) 500 MG tablet Take 500 mg by mouth 2 (two) times daily as needed (pain).    Marland Kitchen aspirin EC 81 MG tablet Take 81 mg by mouth every other day.     . fluorometholone (FML) 0.1 % ophthalmic suspension Place 1 drop into the left eye daily.    . hydroxychloroquine (PLAQUENIL) 200 MG tablet TAKE 1 TABLET AT BEDTIME (Patient taking differently: TAKE 1 TABLET AT BEDTIME, every other day) 90 tablet 1  . LORazepam (ATIVAN) 1 MG tablet TAKE 2 TABLETS BY MOUTH 3 TIMES A DAY AS NEEDED 180 tablet 2  . OLANZapine (ZYPREXA) 5 MG tablet Take 1 tablet (5 mg total) by mouth at bedtime for 30 doses. 90 tablet 1  . valACYclovir (VALTREX) 1000 MG tablet Take 1,000 mg by mouth.    . venlafaxine XR (EFFEXOR-XR) 150 MG 24 hr capsule Take 2 capsules (300 mg total) by mouth daily. 180 capsule 1   No current facility-administered medications for this visit.      Musculoskeletal: Strength & Muscle Tone: within normal limits Gait & Station: normal Patient leans: N/A  Psychiatric Specialty Exam: ROS  Blood pressure 126/80, pulse (!) 103, height 5\' 4"  (1.626 m),  weight 151 lb (68.5 kg).Body mass index is 25.92 kg/m.  General Appearance: Casual and Fairly Groomed  Eye Contact:  Good  Speech:  Clear and Coherent and Normal Rate  Volume:  Normal  Mood:  Dysphoric and Euthymic  Affect:  Blunt and Congruent  Thought Process:  Goal Directed and Descriptions of Associations: Intact  Orientation:  Full (Time, Place, and Person)  Thought Content: Logical   Suicidal Thoughts:  No  Homicidal Thoughts:  No  Memory:  Immediate;   Good  Judgement:  Good  Insight:  Good  Psychomotor Activity:  Normal  Concentration:  Concentration: Good  Recall:  Good  Fund of Knowledge: Good  Language: Good  Akathisia:  Negative  Handed:  Right  AIMS (if indicated): not done  Assets:  Communication Skills Desire for Improvement Financial Resources/Insurance Housing Resilience Social Support Transportation  ADL's:  Intact  Cognition: WNL  Sleep:  Good   Screenings:   Assessment and Plan:  Taziyah Iannuzzi presents with ongoing mild catatonia symptoms which have been gradually improving over the past 8 months.  She has previously failed treatment with TMS and ECT, and has had the most robust response with the current medication below.  She is contending with some recent stressors including putting her home for sale and cleaning out her husband's belongings.  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  She has shown incredible resiliency in the face of multiple stressors, we will continue the medication regimen as below and follow-up in a 6-8 weeks.  Her primary care provider has recently gotten fasting labs yesterday, for routine medication monitoring.  1. Severe episode of recurrent major depressive disorder, without psychotic features ([place])   2. Catatonia     Status of current problems: gradually improving  Labs Ordered: No orders of the defined types were placed in this  encounter.   Labs Reviewed: n/a  Collateral Obtained/Records Reviewed: n/a  Plan:  Continue Effexor 300 mg XR and Zyprexa 5 mg nightly Ativan 2 mg 3 times daily for catatonia We may consider repeat trial of ECT if the patient is open to this, she continues to be opposed to ECT and [place] for now  I spent 20 minutes with the patient in direct face-to-face clinical care.  Greater than 50% of this time was spent in counseling and coordination of care with the patient.    Aundra Dubin, MD 03/29/2017, 10:29 AM

## 2017-04-01 IMAGING — CT CT 3D INDEPENDENT WKST
4 of 5 series · 13 of 27 positions shown, 14 images · non-contrast
Comparison: None.

CLINICAL DATA: Comminuted impacted fracture of the distal right
radius. Abnormal findings on radiological and other examination of
musculoskeletal system.

EXAM:
3-DIMENSIONAL CT IMAGE RENDERING ON INDEPENDENT WORKSTATION
TECHNIQUE: 3-dimensional CT images were rendered by post-processing of the
original CT data on an independent workstation. The 3-dimensional CT
images were interpreted and findings were reported in the
accompanying complete CT report for this study
CT OF THE RIGHT WRIST WITHOUT CONTRAST
TECHNIQUE: Multidetector CT imaging of the right wrist was performed according
to the standard protocol. Multiplanar CT image reconstructions were
also generated.

[Series 5: ext bone 2.0 b70s · axial · 0.45mm/px · z∈[+89,+173]mm · 4 of 71 slices shown]
[im 15/71  bone]
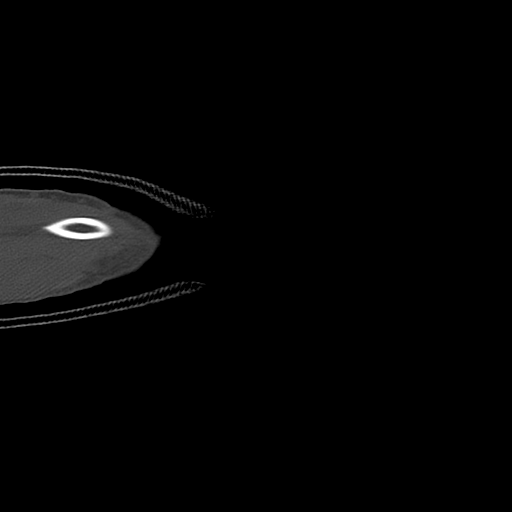
[im 29/71  bone]
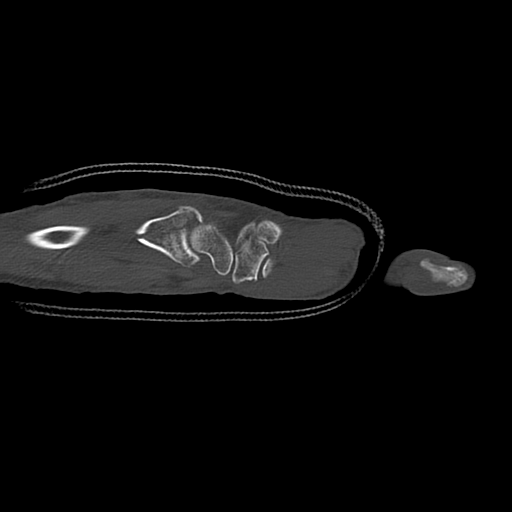
[im 43/71  bone]
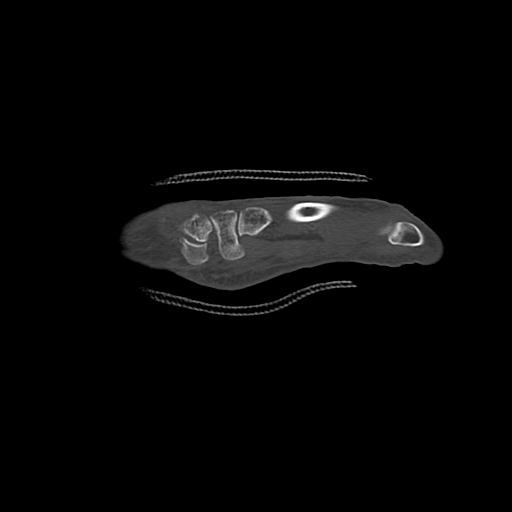
[im 57/71  bone]
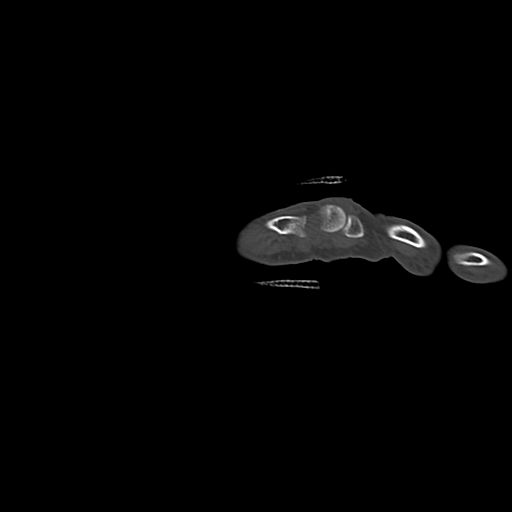

[Series 603: sag rt bone · axial · 0.45mm/px · z∈[+57,+87]mm · 2 of 53 slices shown, 3 images]
[im 18/53  soft-tissue]
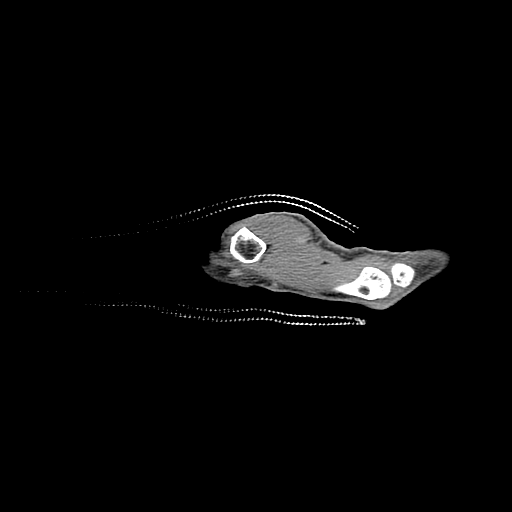
[im 18/53  bone]
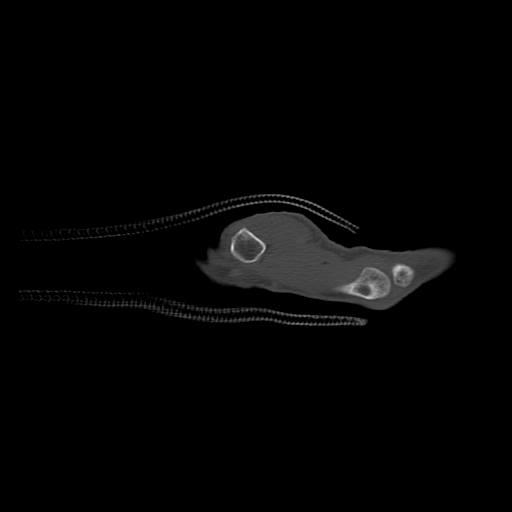
[im 35/53  bone]
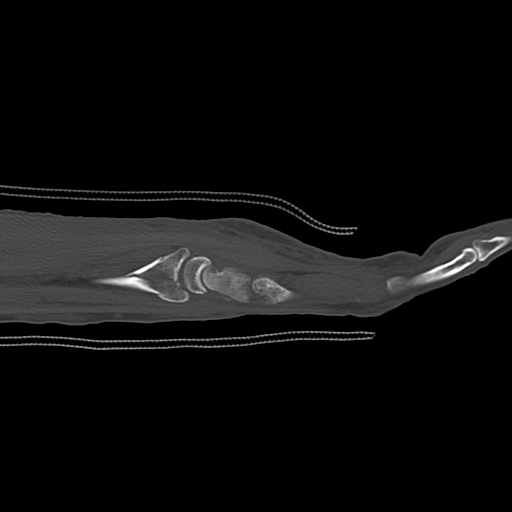

[Series 605: axial rt bone · sagittal · 0.31mm/px · 5 of 47 slices shown]
[im 8/47  bone]
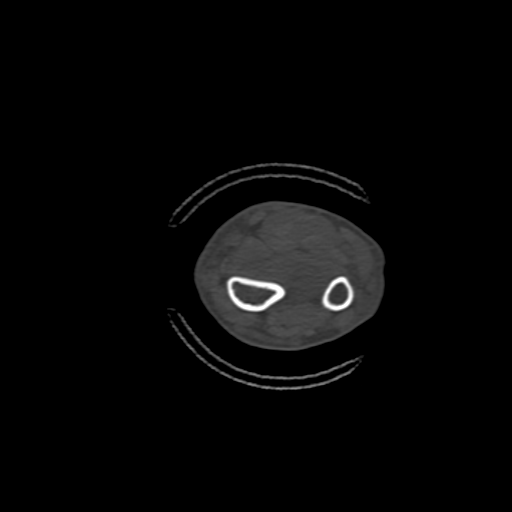
[im 16/47  bone]
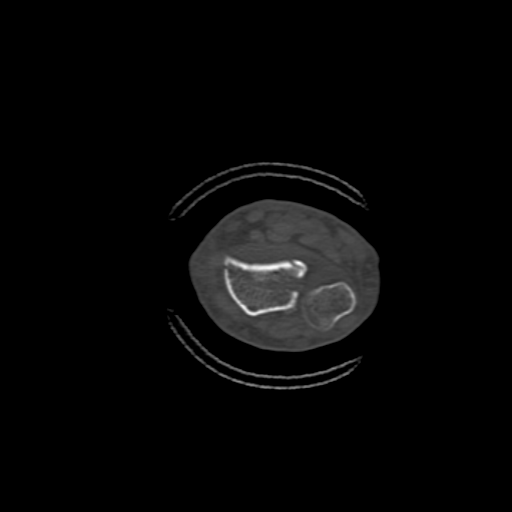
[im 24/47  bone]
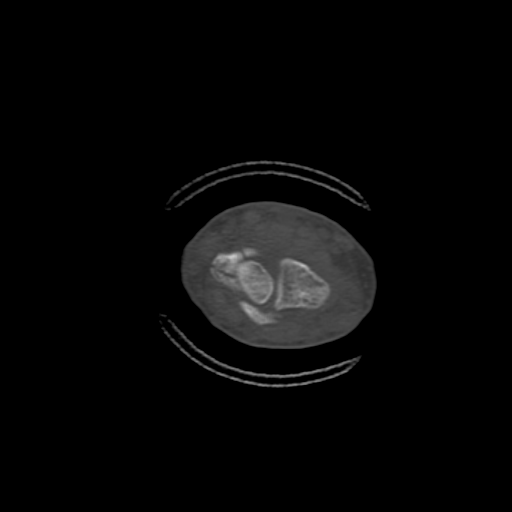
[im 31/47  bone]
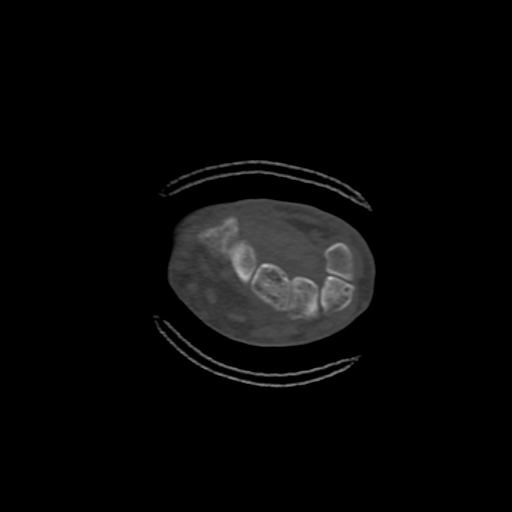
[im 39/47  bone]
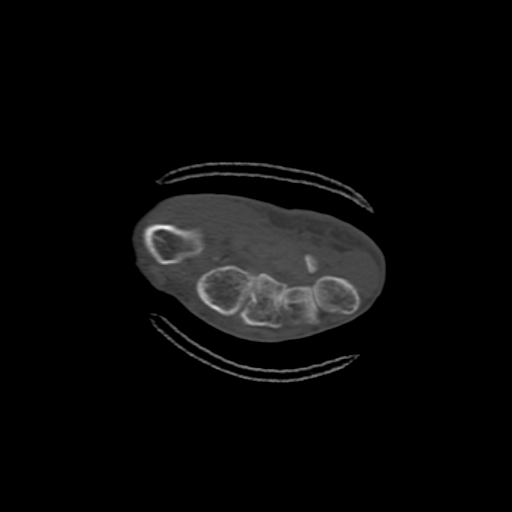

[Series 607: sag rt soft · axial · 0.45mm/px · z∈[+53,+82]mm · 2 of 52 slices shown]
[im 18/52  soft-tissue]
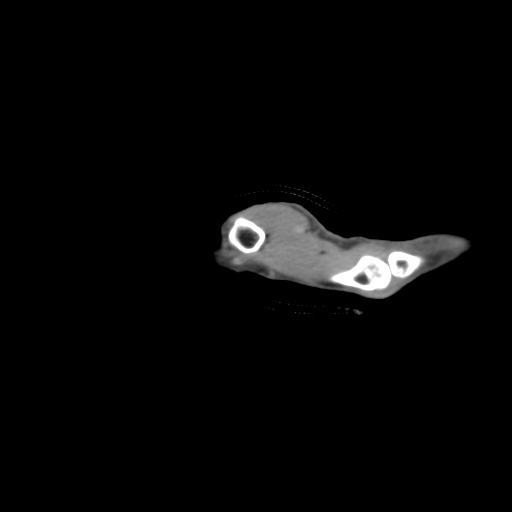
[im 35/52  soft-tissue]
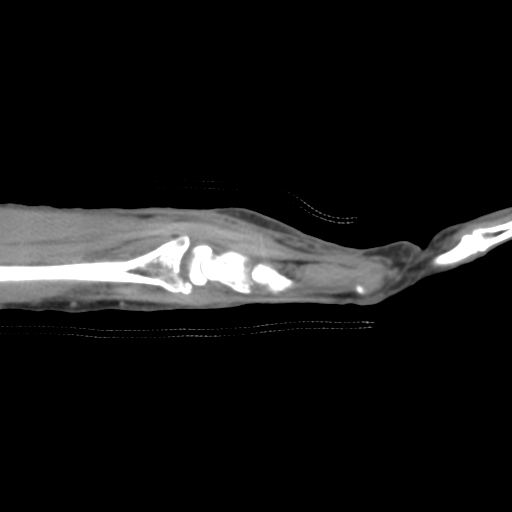

[13 of 27 positions shown; findings below may reference images not displayed]

FINDINGS: There is a comminuted fracture of the distal right radius. The
dorsal articular cortical surface of the distal radius is
disintegrated over a 9 x 12 mm area with impaction of the fracture
at that site. There are transverse and oblique components to the
fracture but there fracture extending proximally approximately
cm. The fracture also involves the articular surface of the radius
at the distal radioulnar joint. The distal ulna is intact.

There is a tiny bone fragment lying dorsal to the scaphoid and a
second tiny bone fragment lying dorsal to the lunate.

The carpal bones are otherwise normal in appearance. The metacarpal
bones and the visualized phalangeal bones are normal.

3D imaging better demonstrates the anatomic orientation and position
of the multiple fragments of the distal radius.
IMPRESSION: Comminuted impacted fracture of the distal right radius as
described.

## 2017-04-05 ENCOUNTER — Ambulatory Visit: Payer: BLUE CROSS/BLUE SHIELD | Admitting: Psychology

## 2017-04-22 DIAGNOSIS — E785 Hyperlipidemia, unspecified: Secondary | ICD-10-CM | POA: Diagnosis not present

## 2017-04-22 DIAGNOSIS — R635 Abnormal weight gain: Secondary | ICD-10-CM | POA: Diagnosis not present

## 2017-04-22 DIAGNOSIS — F329 Major depressive disorder, single episode, unspecified: Secondary | ICD-10-CM | POA: Diagnosis not present

## 2017-04-23 ENCOUNTER — Ambulatory Visit (INDEPENDENT_AMBULATORY_CARE_PROVIDER_SITE_OTHER): Payer: BLUE CROSS/BLUE SHIELD | Admitting: Psychology

## 2017-04-23 DIAGNOSIS — F332 Major depressive disorder, recurrent severe without psychotic features: Secondary | ICD-10-CM | POA: Diagnosis not present

## 2017-05-15 ENCOUNTER — Encounter: Payer: Self-pay | Attending: Internal Medicine | Admitting: Registered"

## 2017-05-15 DIAGNOSIS — E785 Hyperlipidemia, unspecified: Secondary | ICD-10-CM | POA: Insufficient documentation

## 2017-05-15 DIAGNOSIS — Z713 Dietary counseling and surveillance: Secondary | ICD-10-CM | POA: Insufficient documentation

## 2017-05-15 NOTE — Progress Notes (Signed)
  Medical Nutrition Therapy:  Appt start time: 10:55 end time:  11:45.  Pt expectations: ways to lower cholesterol  Assessment:  Primary concerns today: Pt states she wants to decrease cholesterol and lose weight.   Pt states one medication causes high cholesterol and weight gain. Pt states her diet has not been good; has not been eating the right things. Pt states she has battled depression for 20 years, but the last 3 years have been worse. Pt states her husband took his life last June and this last year has been really hard for her as she transitions. Pt states she lives in an independent living facility which provides meals for her. Pt states her  Cholesterol (174) and LDL (177).  Pt states she loves frozen custard from [employer] and goes there daily.   Pt states she does not like to eat meat. Pt states she does not have a kitchen in her room or microwave; only refrigerator. Pt states she does not add salt to food. Pt states she has recently started using sugar substitution.   Preferred Learning Style:   No preference indicated   Learning Readiness:   Ready  Change in progress   MEDICATIONS: See list   DIETARY INTAKE:  Usual eating pattern includes 2 meals and 0-1 snacks per day.  Everyday foods include milkshake, frozen custard, seafood, yogurt.  Avoided foods include meat (chicken, beef, pork).    24-hr recall:  B ( AM): breakfast casserole (eggs, peppers, onions, bacon, tomatoes, cheese), yogurt + fruit + juice + coffee Snk ( AM): none  L ( PM): vanilla and chocolate milkshake Snk ( PM): protein bar D ( PM): baked fish, macaroni and cheese, zucchini Snk ( PM): none Beverages: orange juice, coffee, water, lemonade  Usual physical activity: 20 min walking sporadically  Estimated energy needs: 1600 calories 180 g carbohydrates 120 g protein 44 g fat  Progress Towards3 Goal(s):  In progress.   Nutritional Diagnosis:  NI-5.8.5 Inadeqate fiber intake As related to  lack of fiber-contaning foods.  As evidenced by pt report of small intake of fiber-rich foods in her day.    Intervention:  Nutrition education and counseling. Pt was educated and counseled on the role of fiber, the importance of it, and ways to increase her intake. Pt was educated and counseled on saturated fats versus unsaturated fats, reading nutrition facts labels, and increasing physical activity. Pt agreed with goals we created together.  Goals: - Aim to decrease trips to [employer] to once a week.  - Aim to establish physical activity regimen 30 minutes, 4 times/week.  - Aim to have at least 3 meals a day.  - Reduce intake of breakfast casserole and replace with oatmeal + fruit + nuts for breakfast.  - Choose heart-healthy meals at dining room for lunch and dinner.  - Increase fiber intake with fruits, vegetables, nuts, and whole grain options.   Teaching Method Utilized:  Visual Auditory Hands on  Handouts given during visit include:  Cholesterol lowering nutrition therapy  Barriers to learning/adherence to lifestyle change: none identified  Demonstrated degree of understanding via:  Teach Back   Monitoring/Evaluation:  Dietary intake, exercise, and body weight prn.

## 2017-05-15 NOTE — Patient Instructions (Addendum)
-   Aim to decrease trips to [employer] to once a week.   - Aim to establish physical activity regimen 30 minutes, 4 times/week.   - Aim to have at least 3 meals a day.   - Reduce intake of breakfast casserole and replace with oatmeal + fruit + nuts for breakfast.   - Choose heart-healthy meals at dining room for lunch and dinner.   - Increase fiber intake with fruits, vegetables, nuts, and whole grain options.

## 2017-05-29 ENCOUNTER — Ambulatory Visit (HOSPITAL_COMMUNITY): Payer: BLUE CROSS/BLUE SHIELD | Admitting: Psychiatry

## 2017-05-29 ENCOUNTER — Encounter (HOSPITAL_COMMUNITY): Payer: Self-pay | Admitting: Psychiatry

## 2017-05-29 DIAGNOSIS — Z818 Family history of other mental and behavioral disorders: Secondary | ICD-10-CM

## 2017-05-29 DIAGNOSIS — F061 Catatonic disorder due to known physiological condition: Secondary | ICD-10-CM

## 2017-05-29 DIAGNOSIS — F332 Major depressive disorder, recurrent severe without psychotic features: Secondary | ICD-10-CM | POA: Diagnosis not present

## 2017-05-29 MED ORDER — OLANZAPINE 5 MG PO TABS: 5.0000 mg | ORAL_TABLET | Freq: Every day | ORAL | 1 refills | Status: DC

## 2017-05-29 MED ORDER — VENLAFAXINE HCL ER 150 MG PO CP24: 300.0000 mg | ORAL_CAPSULE | Freq: Every day | ORAL | 1 refills | Status: DC

## 2017-05-29 MED ORDER — LORAZEPAM 1 MG PO TABS: ORAL_TABLET | ORAL | 2 refills | Status: DC

## 2017-05-29 NOTE — Progress Notes (Signed)
BH MD/PA/NP OP Progress Note  05/29/2017 11:40 AM Brianna Ross  MRN:  [id number]  Chief Complaint: grief, med management HPI: Brianna Ross presents with a bright affect, good mood, and reports that she feels like she does not have any depressive symptoms, and things have been going pretty well.  She reports that she feels the best she is felt in a long time.  The Ativan during the day has started to make her feel a little bit tired, and we reflected that this is a positive change, because in the past when she was quite catatonic, the Ativan did not seem to affect her in terms of sedation.   We discussed a plan to reduce Ativan to 1.5 mg in the morning, 1.5 mg in the afternoon, and 2 mg at night.  This is a approximately 18% reduction in the total dose of 6 mg daily.  We agreed to follow-up in 3 weeks and consider further taper at that time.  We agreed to continue Effexor and Zyprexa as prescribed.  She has made some tremendous steps in her process of grief, and has formally met with a realtor, cleaned out the home completely, and is putting the house for sale.  She has been able to experience grief and crying and reports that it has been cathartic.  She denies any suicidal thoughts whatsoever.  She understands Probation officer is leaving practice at the end of August and she wishes to follow-up with Probation officer at new location.  Visit Diagnosis:    ICD-10-CM   1. Severe episode of recurrent major depressive disorder, without psychotic features (HCC) F33.2 OLANZapine (ZYPREXA) 5 MG tablet    venlafaxine XR (EFFEXOR-XR) 150 MG 24 hr capsule    LORazepam (ATIVAN) 1 MG tablet  2. 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 Reviewed, with no updates at this time.   Past Medical History:  Past Medical History:  Diagnosis Date  . Abnormal stress electrocardiogram test   . Allergy   . ANA positive    . Atypical nevi   . Depression   . Headache(784.0)   . Hives 2011   x5  . Hyperlipidemia   . Menopause   . OSA (obstructive sleep apnea)   . Right wrist fracture   . Rosacea   . Shingles 03/2012  . Shortness of breath   . Sleep apnea   . Urticaria     Past Surgical History:  Procedure Laterality Date  . ORIF WRIST FRACTURE    . thymic cyst     removal  . WISDOM TOOTH EXTRACTION      Family Psychiatric History: See intake H&P for full details. Reviewed, with no updates at this time.   Family History:  Family History  Problem Relation Age of Onset  . Glaucoma Mother   . Hyperlipidemia Mother   . Depression Mother   . Osteoporosis Mother   . Heart disease Father   . Hyperlipidemia Father   . Hypertension Father   . Heart defect Sister   . Heart disease Sister 13       congenital heart defect,deceased  . Hyperlipidemia Brother   . Depression Brother   . High Cholesterol Brother   . Cancer Paternal Grandmother   . Stroke Paternal Grandfather   . Heart disease Paternal Grandfather     Social History:  Social History   Socioeconomic  History  . Marital status: Married    Spouse name: Sonny  . Number of children: 0  . Years of education: 15  . Highest education level: Not on file  Occupational History    Employer: [employer]  Social Needs  . Financial resource strain: Not on file  . Food insecurity:    Worry: Not on file    Inability: Not on file  . Transportation needs:    Medical: Not on file    Non-medical: Not on file  Tobacco Use  . Smoking status: Never Smoker  . Smokeless tobacco: Never Used  Substance and Sexual Activity  . Alcohol use: No    Comment: rarely  . Drug use: No  . Sexual activity: Not Currently    Birth control/protection: Post-menopausal  Lifestyle  . Physical activity:    Days per week: Not on file    Minutes per session: Not on file  . Stress: Not on file  Relationships  . Social connections:     Talks on phone: Not on file    Gets together: Not on file    Attends religious service: Not on file    Active member of club or organization: Not on file    Attends meetings of clubs or organizations: Not on file    Relationship status: Not on file  Other Topics Concern  . Not on file  Social History Narrative   Patient works for [employer].(full-time_    Patient lives at home with her husband [name]).   Patient does not have any children.   Patient is right-handed.   Patient drinks one cup of coffee everyday and two cups of tea daily.    Allergies: No Known Allergies  Metabolic Disorder Labs: No results found for: HGBA1C, MPG No results found for: PROLACTIN Lab Results  Component Value Date   CHOL 188 11/13/2013   TRIG 82 11/13/2013   HDL 68 11/13/2013   CHOLHDL 2.8 11/13/2013   VLDL 16 11/13/2013   LDLCALC 104 (H) 11/13/2013   LDLCALC 129 (H) 11/03/2012   Lab Results  Component Value Date   TSH 1.684 11/13/2013   TSH 1.092 11/03/2012    Therapeutic Level Labs: No results found for: LITHIUM No results found for: VALPROATE No components found for:  CBMZ  Current Medications: Current Outpatient Medications  Medication Sig Dispense Refill  . aspirin EC 81 MG tablet Take 81 mg by mouth every other day.     . Cholecalciferol (VITAMIN D) 2000 units tablet Take 2,000 Units by mouth daily.    . fluorometholone (FML) 0.1 % ophthalmic ointment 1 application 4 (four) times daily.    Marland Kitchen LORazepam (ATIVAN) 1 MG tablet TAKE 2 TABLETS BY MOUTH 3 TIMES A DAY AS NEEDED 180 tablet 2  . acetaminophen (TYLENOL) 500 MG tablet Take 500 mg by mouth 2 (two) times daily as needed (pain).    . hydroxychloroquine (PLAQUENIL) 200 MG tablet TAKE 1 TABLET AT BEDTIME (Patient not taking: Reported on 05/15/2017) 90 tablet 1  . OLANZapine (ZYPREXA) 5 MG tablet Take 1 tablet (5 mg total) by mouth at bedtime for 30 doses. 90 tablet 1  . venlafaxine XR (EFFEXOR-XR) 150 MG 24 hr  capsule Take 2 capsules (300 mg total) by mouth daily. 180 capsule 1   No current facility-administered medications for this visit.      Musculoskeletal: Strength & Muscle Tone: within normal limits Gait & Station: normal Patient leans: N/A  Psychiatric Specialty Exam: ROS  Blood pressure 93/69, pulse 97, height 5' 4"  (1.626 m), weight 159 lb (72.1 kg), SpO2 95 %.Body mass index is 27.29 kg/m.  General Appearance: Casual and Well Groomed  Eye Contact:  Good  Speech:  Clear and Coherent and Normal Rate  Volume:  Normal  Mood:  Euthymic  Affect:  Appropriate and Congruent  Thought Process:  Goal Directed and Descriptions of Associations: Intact  Orientation:  Full (Time, Place, and Person)  Thought Content: Logical   Suicidal Thoughts:  No  Homicidal Thoughts:  No  Memory:  Immediate;   Good  Judgement:  Good  Insight:  Good  Psychomotor Activity:  Normal  Concentration:  Concentration: Good  Recall:  Good  Fund of Knowledge: Good  Language: Good  Akathisia:  Negative  Handed:  Right  AIMS (if indicated): not done  Assets:  Communication Skills Desire for Improvement Financial Resources/Insurance Housing Resilience Social Support Transportation Vocational/Educational  ADL's:  Intact  Cognition: WNL  Sleep:  Good   Screenings: PHQ2-9     Nutrition from 05/15/2017 in [facility]  PHQ-2 Total Score  2  PHQ-9 Total Score  5       Assessment and Plan:  Taylee Gunnells is a 62 year old female with a psychiatric history of severe major depressive disorder and catatonia.  Her catatonia symptoms have persisted for many months, and over the past few visits have come to a significant reduction and resolution.  We have now decided on a process to begin tapering her off of Lorazepam.  She has no acute safety issues and has an incredible support system in her family.  She continues to live at the retirement community in [place].  She presents  today as generally cheerful and euthymic, and has been able to experience an appropriate Lorenza Evangelist of grief over the last year and the aftermath of her husband's suicide.   She is aware that writer is transitioning out of office and will follow-up multiple times over the next 2-3 months as we taper Ativan, and continue in care with this writer in September at new location.  She continues in individual therapy and has tapered her visit frequency.  She also has done a tremendous job participating in grief counseling groups.   1. Severe episode of recurrent major depressive disorder, without psychotic features ([place])   2. Catatonia     Status of current problems: stable  Labs Ordered: No orders of the defined types were placed in this encounter.   Labs Reviewed: NA  Collateral Obtained/Records Reviewed: NA  Plan:  Continue Effexor 300 mg extended release Continue Zyprexa 5 mg nightly Reduce Ativan from 2 mg 3 times daily, to 1.5 mg BID + 2 mg QHS Reducing Ativan from total daily dose of 6 mg to 5 mg Follow-up in 3 weeks  Aundra Dubin, MD 05/29/2017, 11:40 AM

## 2017-06-04 ENCOUNTER — Telehealth: Payer: Self-pay | Admitting: Neurology

## 2017-06-04 NOTE — Telephone Encounter (Signed)
Pt called she is requesting RX for bipap supplies sent [facility]. Pt said she has not been using the bipap but is getting ready to start again. Please call to advise

## 2017-06-04 NOTE — Telephone Encounter (Signed)
I have reached out to inquire if a new order is necessary from [facility]. Pt has been seen within the last year and has an upcoming apt in august to cover ordering supplies next year. Will wait to hear back from [facility] and then if a new order is necessary I will place or I will inform the patient she can just check with [facility].

## 2017-06-12 ENCOUNTER — Ambulatory Visit (INDEPENDENT_AMBULATORY_CARE_PROVIDER_SITE_OTHER): Payer: BLUE CROSS/BLUE SHIELD | Admitting: Psychology

## 2017-06-12 DIAGNOSIS — F332 Major depressive disorder, recurrent severe without psychotic features: Secondary | ICD-10-CM

## 2017-06-18 ENCOUNTER — Encounter (HOSPITAL_COMMUNITY): Payer: Self-pay | Admitting: Psychiatry

## 2017-06-18 NOTE — Telephone Encounter (Signed)
Called the patient and made her aware that [facility] had reached out to me informing that we would have to complete a office visit stating the pt is compliant and using her current CPAP before insurance will cover the cost of the supplies. The patient had not gotten supplies from [facility] since 2017. Someone from [facility] had already called the patient and informed her of this. She has an upcoming apt in aug and so she paid out of pocket for the supplies to that she can restart and then in aug show that she is being compliant to get them to pay going forward. Pt was appreciative for the call

## 2017-06-21 ENCOUNTER — Encounter (HOSPITAL_COMMUNITY): Payer: Self-pay | Admitting: Psychiatry

## 2017-06-21 ENCOUNTER — Ambulatory Visit (HOSPITAL_COMMUNITY): Payer: BLUE CROSS/BLUE SHIELD | Admitting: Psychiatry

## 2017-06-21 VITALS — BP 128/74 | HR 80 | Ht 64.5 in | Wt 162.0 lb

## 2017-06-21 DIAGNOSIS — F332 Major depressive disorder, recurrent severe without psychotic features: Secondary | ICD-10-CM

## 2017-06-21 DIAGNOSIS — F061 Catatonic disorder due to known physiological condition: Secondary | ICD-10-CM

## 2017-06-21 NOTE — Progress Notes (Signed)
BH MD/PA/NP OP Progress Note  06/21/2017 11:38 AM Brianna Ross  MRN:  [id number]  Chief Complaint: grief, med management  HPI: Brianna Ross has done well with the reduction of Ativan to 1.5 mg twice a day and 2 mg at night.  She feels like her energy is pretty good, and she has not had any slow down or resurgence of catatonia.  We agreed to decrease further to 1 mg twice daily, and 2 mg at night.  She continues on the current dose of Zyprexa and Effexor without any change or concern regarding side effects.  She is in the process of selling her home and shares some appropriate grief and sense of loss.  She continues to be quite resilient, and participating in therapy.  She writes letters to her deceased husband, updating him on her progress and sharing her feelings with him, which is therapeutic for her.  We agreed to follow-up in 3-4 weeks to check in on her progress regarding the reduction of Ativan.  She knows to reach out to writer or increase the dose of Ativan if she has any return of catatonia symptoms.  She has excellent support from her friend who is also a Marine scientist.  Visit Diagnosis:    ICD-10-CM   1. Severe episode of recurrent major depressive disorder, without psychotic features ([place]) F33.2   2. Catatonia F06.1    Past Psychiatric History: See intake H&P for full details. Reviewed, with no updates at this time.  Past Medical History:  Past Medical History:  Diagnosis Date  . Abnormal stress electrocardiogram test   . Allergy   . ANA positive   . Atypical nevi   . Depression   . Headache(784.0)   . Hives 2011   x5  . Hyperlipidemia   . Menopause   . OSA (obstructive sleep apnea)   . Right wrist fracture   . Rosacea   . Shingles 03/2012  . Shortness of breath   . Sleep apnea   . Urticaria     Past Surgical History:  Procedure Laterality Date  . ORIF WRIST FRACTURE    . thymic cyst     removal  . WISDOM TOOTH EXTRACTION      Family Psychiatric History: See intake H&P  for full details. Reviewed, with no updates at this time.   Family History:  Family History  Problem Relation Age of Onset  . Glaucoma Mother   . Hyperlipidemia Mother   . Depression Mother   . Osteoporosis Mother   . Heart disease Father   . Hyperlipidemia Father   . Hypertension Father   . Heart defect Sister   . Heart disease Sister 13       congenital heart defect,deceased  . Hyperlipidemia Brother   . Depression Brother   . High Cholesterol Brother   . Cancer Paternal Grandmother   . Stroke Paternal Grandfather   . Heart disease Paternal Grandfather     Social History:  Social History   Socioeconomic History  . Marital status: Married    Spouse name: Sonny  . Number of children: 0  . Years of education: 63  . Highest education level: Not on file  Occupational History    Employer: [employer]  Social Needs  . Financial resource strain: Not on file  . Food insecurity:    Worry: Not on file    Inability: Not on file  . Transportation needs:    Medical: Not on file    Non-medical:  Not on file  Tobacco Use  . Smoking status: Never Smoker  . Smokeless tobacco: Never Used  Substance and Sexual Activity  . Alcohol use: No    Comment: rarely  . Drug use: No  . Sexual activity: Not Currently    Birth control/protection: Post-menopausal  Lifestyle  . Physical activity:    Days per week: Not on file    Minutes per session: Not on file  . Stress: Not on file  Relationships  . Social connections:    Talks on phone: Not on file    Gets together: Not on file    Attends religious service: Not on file    Active member of club or organization: Not on file    Attends meetings of clubs or organizations: Not on file    Relationship status: Not on file  Other Topics Concern  . Not on file  Social History Narrative   Patient works for [employer].(full-time_    Patient lives at home with her husband [name]).   Patient does not  have any children.   Patient is right-handed.   Patient drinks one cup of coffee everyday and two cups of tea daily.    Allergies: No Known Allergies  Metabolic Disorder Labs: No results found for: HGBA1C, MPG No results found for: PROLACTIN Lab Results  Component Value Date   CHOL 188 11/13/2013   TRIG 82 11/13/2013   HDL 68 11/13/2013   CHOLHDL 2.8 11/13/2013   VLDL 16 11/13/2013   LDLCALC 104 (H) 11/13/2013   LDLCALC 129 (H) 11/03/2012   Lab Results  Component Value Date   TSH 1.684 11/13/2013   TSH 1.092 11/03/2012    Therapeutic Level Labs: No results found for: LITHIUM No results found for: VALPROATE No components found for:  CBMZ  Current Medications: Current Outpatient Medications  Medication Sig Dispense Refill  . acetaminophen (TYLENOL) 500 MG tablet Take 500 mg by mouth 2 (two) times daily as needed (pain).    Marland Kitchen aspirin EC 81 MG tablet Take 81 mg by mouth every other day.     . Cholecalciferol (VITAMIN D) 2000 units tablet Take 2,000 Units by mouth daily.    . fluorometholone (FML) 0.1 % ophthalmic ointment 1 application 4 (four) times daily.    . hydroxychloroquine (PLAQUENIL) 200 MG tablet TAKE 1 TABLET AT BEDTIME (Patient not taking: Reported on 05/15/2017) 90 tablet 1  . LORazepam (ATIVAN) 1 MG tablet TAKE 2 TABLETS BY MOUTH 3 TIMES A DAY AS NEEDED 180 tablet 2  . OLANZapine (ZYPREXA) 5 MG tablet Take 1 tablet (5 mg total) by mouth at bedtime for 30 doses. 90 tablet 1  . venlafaxine XR (EFFEXOR-XR) 150 MG 24 hr capsule Take 2 capsules (300 mg total) by mouth daily. 180 capsule 1   No current facility-administered medications for this visit.      Musculoskeletal: Strength & Muscle Tone: within normal limits Gait & Station: normal Patient leans: N/A  Psychiatric Specialty Exam: ROS  Blood pressure 128/74, pulse 80, height 5' 4.5" (1.638 m), weight 162 lb (73.5 kg).Body mass index is 27.38 kg/m.  General Appearance: Casual and Well Groomed  Eye  Contact:  Good  Speech:  Clear and Coherent and Normal Rate  Volume:  Normal  Mood:  Euthymic  Affect:  Appropriate and Congruent  Thought Process:  Goal Directed and Descriptions of Associations: Intact  Orientation:  Full (Time, Place, and Person)  Thought Content: Logical   Suicidal Thoughts:  No  Homicidal Thoughts:  No  Memory:  Immediate;   Good  Judgement:  Good  Insight:  Good  Psychomotor Activity:  Normal  Concentration:  Concentration: Good  Recall:  Good  Fund of Knowledge: Good  Language: Good  Akathisia:  Negative  Handed:  Right  AIMS (if indicated): not done  Assets:  Communication Skills Desire for Improvement Financial Resources/Insurance Housing Resilience Social Support Transportation Vocational/Educational  ADL's:  Intact  Cognition: WNL  Sleep:  Good   Screenings: PHQ2-9     Nutrition from 05/15/2017 in [facility]  PHQ-2 Total Score  2  PHQ-9 Total Score  5       Assessment and Plan:  Talea Manges is a 62 year old female with a psychiatric history of severe major depressive disorder and catatonia.  We are in the process of tapering lorazepam gradually, and we will maintain the current doses of Zyprexa and Effexor for treatment of major depressive disorder.  No acute safety issues and she has excellent family and social support.  1. Severe episode of recurrent major depressive disorder, without psychotic features ([place])   2. Catatonia     Status of current problems: stable  Labs Ordered: No orders of the defined types were placed in this encounter.   Labs Reviewed: NA  Collateral Obtained/Records Reviewed: NA  Plan:  Continue Effexor 300 mg extended release Continue Zyprexa 5 mg nightly Reduce Ativan to 1 mg BID + 2 mg QHS Reducing Ativan from total daily dose of 5 mg to 4 mg Follow-up in 4 weeks  Aundra Dubin, MD 06/21/2017, 11:38 AM

## 2017-07-11 ENCOUNTER — Ambulatory Visit (INDEPENDENT_AMBULATORY_CARE_PROVIDER_SITE_OTHER): Payer: BLUE CROSS/BLUE SHIELD | Admitting: Psychology

## 2017-07-11 DIAGNOSIS — F332 Major depressive disorder, recurrent severe without psychotic features: Secondary | ICD-10-CM | POA: Diagnosis not present

## 2017-07-12 NOTE — Progress Notes (Signed)
Office Visit Note  Patient: Brianna Ross             Date of Birth: [birth year]           MRN: [id number]             PCP: Lanice Shirts, MD Referring: Lanice Shirts, * Visit Date: 07/25/2017 Occupation: @GUAROCC @  Subjective:  Bilateral knee pain   History of Present Illness: Alia Parsley is a 62 y.o. female with history of autoimmune disease and osteoarthritis.  Patient discontinued Plaquenil in January 2019 due to her clinically doing well.  She continues to take aspirin 81 mg every other day.  She reports that she has not noticed any difference since discontinuing Plaquenil.  She denies any recent rashes.  She denies any sores in her mouth or nose.  She denies any sicca symptoms.  She denies any symptoms of Raynaud's.  She denies any shortness of breath or palpitations.  Patient states that she is having bilateral knee pain that started 6 to 8 weeks ago.  She states back in March she was moving and cleaning her home which led to increased stiffness in bilateral knee joints.  She denies any joint swelling at this time.  She states the pain is most severe when she is bending her knee or going up and down steps.  She states the pain is more severe in her right knee.  Activities of Daily Living:  Patient reports morning stiffness for 0  minutes.   Patient Denies nocturnal pain.  Difficulty dressing/grooming: Denies Difficulty climbing stairs: Reports Difficulty getting out of chair: Reports Difficulty using hands for taps, buttons, cutlery, and/or writing: Denies  Review of Systems  Constitutional: Negative for fatigue.  HENT: Negative for mouth sores, mouth dryness and nose dryness.   Eyes: Negative for pain, visual disturbance and dryness.  Respiratory: Negative for cough, hemoptysis, shortness of breath and difficulty breathing.   Cardiovascular: Negative for chest pain, palpitations, hypertension and swelling in legs/feet.  Gastrointestinal: Negative for blood in stool,  constipation and diarrhea.  Endocrine: Negative for increased urination.  Genitourinary: Negative for painful urination.  Musculoskeletal: Positive for arthralgias, joint pain and morning stiffness. Negative for joint swelling, myalgias, muscle weakness, muscle tenderness and myalgias.  Skin: Negative for color change, pallor, rash, hair loss, nodules/bumps, skin tightness, ulcers and sensitivity to sunlight.  Neurological: Negative for dizziness, numbness, headaches and weakness.  Hematological: Negative for swollen glands.  Psychiatric/Behavioral: Positive for depressed mood. Negative for sleep disturbance. The patient is not nervous/anxious.     PMFS History:  Patient Active Problem List   Diagnosis Date Noted  . Primary osteoarthritis of both hands 02/27/2016  . High risk medication use 02/20/2016  . Autoimmune disease (HCC)positive ANA, hypocomplementemia, positive beta 2 and positive anticardiolipin antibodies.  02/20/2016  . Sicca syndrome, unspecified ([place]) 02/20/2016  . History of sleep apnea 02/20/2016  . Severe major depression without psychotic features ([place]) 02/24/2015  . OSA on CPAP 08/11/2014  . Hearing loss 06/29/2014  . OSA treated with BiPAP 08/06/2013  . Hyperlipidemia 04/29/2013  . 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 Family history of heart disease 12/10/2012  . 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 Positive ANA (antinuclear antibody) 03/06/2012  . Elevated aldolase level 03/06/2012  . Atypical nevi 11/14/2011  . Menopause 11/13/2011  . Rosacea 11/13/2011    Past Medical History:  Diagnosis Date  . Abnormal stress electrocardiogram test   . Allergy   .  ANA positive   . Atypical nevi   . Depression   . Headache(784.0)   . Hives 2011   x5  . Hyperlipidemia   . Menopause   . OSA (obstructive sleep apnea)   . Right wrist fracture   . Rosacea   . Shingles 03/2012  . Shortness of breath   . Sleep apnea   . Urticaria      Family History  Problem Relation Age of Onset  . Glaucoma Mother   . Hyperlipidemia Mother   . Depression Mother   . Osteoporosis Mother   . Heart disease Father   . Hyperlipidemia Father   . Hypertension Father   . Heart defect Sister   . Heart disease Sister 13       congenital heart defect,deceased  . Hyperlipidemia Brother   . Depression Brother   . High Cholesterol Brother   . Cancer Paternal Grandmother   . Stroke Paternal Grandfather   . Heart disease Paternal Grandfather    Past Surgical History:  Procedure Laterality Date  . ORIF WRIST FRACTURE    . thymic cyst     removal  . WISDOM TOOTH EXTRACTION     Social History   Social History Narrative   Patient works for [employer].(full-time_    Patient lives at home with her husband [name]).   Patient does not have any children.   Patient is right-handed.   Patient drinks one cup of coffee everyday and two cups of tea daily.    Objective: Vital Signs: BP 115/74 (BP Location: Left Arm, Patient Position: Sitting, Cuff Size: Normal)   Pulse 92   Resp 14   Ht 5\' 5"  (1.651 m)   Wt 166 lb (75.3 kg)   BMI 27.62 kg/m    Physical Exam  Constitutional: She is oriented to person, place, and time. She appears well-developed and well-nourished.  HENT:  Head: Normocephalic and atraumatic.  No oral or nasal ulcerations.  No parotid swelling.   Eyes: Conjunctivae and EOM are normal.  Neck: Normal range of motion.  Cardiovascular: Normal rate, regular rhythm, normal heart sounds and intact distal pulses.  Pulmonary/Chest: Effort normal and breath sounds normal.  Abdominal: Soft. Bowel sounds are normal.  Lymphadenopathy:    She has no cervical adenopathy.  Neurological: She is alert and oriented to person, place, and time.  Skin: Skin is warm and dry. Capillary refill takes less than 2 seconds.  No malar rash noted.   Psychiatric: She has a normal mood and affect. Her behavior is normal.    Nursing note and vitals reviewed.    Musculoskeletal Exam: C-spine, thoracic spine, and lumbar spine good ROM.  Shoulder joints, elbow joints, wrist joints, MCPs, PIPs, and DIPs good ROM with no synovitis.  {PIP and DIP synovial thickening consistent with osteoarthritis. Left CMC joint synovial thickening.  Hip joints, knee joints, ankle joints, MTPs, PIPs, and DIPs good ROM with no synovitis.  No warmth or effusion of knee joints.  CDAI Exam: No CDAI exam completed.   Investigation: No additional findings.  Imaging: No results found.  Recent Labs: Lab Results  Component Value Date   WBC 5.8 09/25/2016   HGB 13.8 09/25/2016   PLT 239 09/25/2016   NA 140 09/25/2016   K 4.0 09/25/2016   CL 104 09/25/2016   CO2 30 09/25/2016   GLUCOSE 86 09/25/2016   BUN 14 09/25/2016   CREATININE 0.67 09/25/2016   BILITOT 0.4 09/25/2016   ALKPHOS 48 04/25/2016  AST 18 09/25/2016   ALT 26 09/25/2016   PROT 5.9 (L) 09/25/2016   ALBUMIN 4.9 04/25/2016   CALCIUM 9.2 09/25/2016   GFRAA 111 09/25/2016    Speciality Comments: PLQ Eye Exam: 01/03/17 WNL @ [facility]  Procedures:  No procedures performed Allergies: Patient has no known allergies.   Assessment / Plan:     Visit Diagnoses: Autoimmune disease (HCC)positive ANA, hypocomplementemia, positive beta 2 and positive anticardiolipin antibodies.  -She has no clinical signs of autoimmune disease at this time.  She discontinued Plaquenil in January 2019 and has not noticed any difference.  She has no synovitis on exam.  No oral or nasal ulcerations were noted.  No cervical lymphadenopathy was palpated.  She denies sicca symptoms or symptoms of Raynaud's.  No digital ulcerations or signs of gangrene.  No malar rash noted.  She does not experience photosensitivity.  She will continue taking aspirin 81 mg every other day.  She would not like to restart on Plaquenil this time.  We will check autoimmune labs today.  Plan: COMPLETE  METABOLIC PANEL WITH GFR, CBC with Differential/Platelet, Urinalysis, Routine w reflex microscopic, C3 and C4, Anti-DNA antibody, double-stranded, Sedimentation rate, Beta-2 glycoprotein antibodies, Cardiolipin antibodies, IgG, IgM, IgA  High risk medication use - She discontinued PLQ in January 2019. PLQ eye exam: 01/03/2017 normal.  Sicca syndrome, unspecified ([place]): She does not have any sicca symptoms at this time.  No parotid swelling noted.   Primary osteoarthritis of both hands: She has PIP and DIP synovial thickening consistent with zoster arthritis of bilateral hands.  She has left CMC joint synovial thickening.  She has no synovitis noted.  She has no tenderness on exam.  Joint protection and muscle strengthening were discussed.  Chronic pain of both knees - She has no warmth or effusion.  She has good ROM on exam.  She has been having increased pain and joint stiffness for the past 6-8 weeks.  X-rays of bilateral knees were obtained today.  She was given a handout of knee exercises that she can perform at home. She declined a cortisone injection today.  A prescription for voltaren gel was provided to the patient. A goodrx prescription was provided. Plan: XR KNEE 3 VIEW LEFT, XR KNEE 3 VIEW RIGHT.  The x-rays showed moderate osteoarthritis and moderate chondromalacia patella  Osteoporosis screening -She takes calcium and vitamin D. She gives a strong family history of osteoporosis. She has not had a bone density scan, so I scheduled one for her today.  She will be contacted to schedule the appointment.  Plan: DG BONE DENSITY (DXA)   Other medical conditions are listed as follows:   Severe major depression without psychotic features ([place]) - She continues to have significant depression and has been seen by psychiatrist.  History of hyperlipidemia  Rosacea  History of sleep apnea  History of herpes zoster keratoconjunctivitis    Orders: Orders Placed This Encounter  Procedures  . XR  KNEE 3 VIEW LEFT  . XR KNEE 3 VIEW RIGHT  . DG BONE DENSITY (DXA)  . COMPLETE METABOLIC PANEL WITH GFR  . CBC with Differential/Platelet  . Urinalysis, Routine w reflex microscopic  . C3 and C4  . Anti-DNA antibody, double-stranded  . Sedimentation rate  . Beta-2 glycoprotein antibodies  . Cardiolipin antibodies, IgG, IgM, IgA   Meds ordered this encounter  Medications  . diclofenac sodium (VOLTAREN) 1 % GEL    Sig: Apply 3 g to 3 large joints up to  3 times daily.    Dispense:  3 Tube    Refill:  3    Face-to-face time spent with patient was 30 minutes. Greater than 50% of time was spent in counseling and coordination of care.  Follow-Up Instructions: Return in about 6 months (around 01/25/2018) for Autoimmune Disease, Osteoarthritis.   Ofilia Neas, PA-C    I examined and evaluated the patient with Hazel Sams PA.  She had discomfort in her bilateral knee joints on my examination.  The x-rays today reveal moderate osteoarthritis and moderate chondromalacia patella.  We will monitor her labs today as she has been off Plaquenil.  The plan of care was discussed as noted above.  Bo Merino, MD Note - This record has been created using Editor, commissioning.  Chart creation errors have been sought, but may not always  have been located. Such creation errors do not reflect on  the standard of medical care.

## 2017-07-19 ENCOUNTER — Ambulatory Visit (HOSPITAL_COMMUNITY): Payer: BLUE CROSS/BLUE SHIELD | Admitting: Psychiatry

## 2017-07-19 ENCOUNTER — Encounter (HOSPITAL_COMMUNITY): Payer: Self-pay | Admitting: Psychiatry

## 2017-07-19 DIAGNOSIS — Z818 Family history of other mental and behavioral disorders: Secondary | ICD-10-CM

## 2017-07-19 DIAGNOSIS — F061 Catatonic disorder due to known physiological condition: Secondary | ICD-10-CM | POA: Diagnosis not present

## 2017-07-19 DIAGNOSIS — F332 Major depressive disorder, recurrent severe without psychotic features: Secondary | ICD-10-CM | POA: Diagnosis not present

## 2017-07-19 MED ORDER — LORAZEPAM 1 MG PO TABS: ORAL_TABLET | ORAL | 2 refills | Status: DC

## 2017-07-19 NOTE — Progress Notes (Signed)
BH MD/PA/NP OP Progress Note  07/19/2017 9:53 AM Brianna Ross  MRN:  [id number]  Chief Complaint: med management  HPI: Brianna Ross presents with bright affect, reports that she is tolerating the decrease in Ativan swimmingly.  She has good energy, sleeps well at night.  Denies any suicidality.  Continues to participate in group grief processes.  She reports that she has sold her home and the closing is on Monday.  She has saddened but also happy and feels like this is turning a new chapter in her life.  She is thinking about volunteer activities and even thinking about a part-time job.  Spent time discussing how impressed and moving it is to see her be able to move forward.  Spent time discussing anticipation of any issues that may arise and how he can troubleshoot these things.  We agreed to taper the Ativan further to 0.5 mg twice daily and continue 2 mg nightly.  We will follow-up in 6-8 weeks or sooner if needed.  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 Severe episode of recurrent major depressive disorder, without psychotic features ([place]) F33.2 LORazepam (ATIVAN) 1 MG tablet     Past Psychiatric History: See intake H&P for full details. Reviewed, with no updates at this time.  Past Medical History:  Past Medical History:  Diagnosis Date  . Abnormal stress electrocardiogram test   . Allergy   . ANA positive   . Atypical nevi   . Depression   . Headache(784.0)   . Hives 2011   x5  . Hyperlipidemia   . Menopause   . OSA (obstructive sleep apnea)   . Right wrist fracture   . Rosacea   . Shingles 03/2012  . Shortness of breath   . Sleep apnea   . Urticaria     Past Surgical History:  Procedure Laterality Date  . ORIF WRIST FRACTURE    . thymic cyst     removal  . WISDOM TOOTH EXTRACTION      Family Psychiatric History: See intake H&P for full details. Reviewed, with no updates at this time.   Family History:  Family History   Problem Relation Age of Onset  . Glaucoma Mother   . Hyperlipidemia Mother   . Depression Mother   . Osteoporosis Mother   . Heart disease Father   . Hyperlipidemia Father   . Hypertension Father   . Heart defect Sister   . Heart disease Sister 13       congenital heart defect,deceased  . Hyperlipidemia Brother   . Depression Brother   . High Cholesterol Brother   . Cancer Paternal Grandmother   . Stroke Paternal Grandfather   . Heart disease Paternal Grandfather     Social History:  Social History   Socioeconomic History  . Marital status: Married    Spouse name: Brianna Ross  . Number of children: 0  . Years of education: 26  . Highest education level: Not on file  Occupational History    Employer:  [employer]  Social Needs  . Financial resource strain: Not on file  . Food insecurity:    Worry: Not on file    Inability: Not on file  . Transportation needs:    Medical: Not on file    Non-medical: Not on file  Tobacco Use  . Smoking status: Never Smoker  . Smokeless tobacco: Never Used  Substance and Sexual Activity  . Alcohol  use: No    Comment: rarely  . Drug use: No  . Sexual activity: Not Currently    Birth control/protection: Post-menopausal  Lifestyle  . Physical activity:    Days per week: Not on file    Minutes per session: Not on file  . Stress: Not on file  Relationships  . Social connections:    Talks on phone: Not on file    Gets together: Not on file    Attends religious service: Not on file    Active member of club or organization: Not on file    Attends meetings of clubs or organizations: Not on file    Relationship status: Not on file  Other Topics Concern  . Not on file  Social History Narrative   Patient works for [employer].(full-time_    Patient lives at home with her husband Brianna Ross).   Patient does not have any children.   Patient is right-handed.   Patient drinks one cup of coffee everyday and  two cups of tea daily.    Allergies: No Known Allergies  Metabolic Disorder Labs: No results found for: HGBA1C, MPG No results found for: PROLACTIN Lab Results  Component Value Date   CHOL 188 11/13/2013   TRIG 82 11/13/2013   HDL 68 11/13/2013   CHOLHDL 2.8 11/13/2013   VLDL 16 11/13/2013   LDLCALC 104 (H) 11/13/2013   LDLCALC 129 (H) 11/03/2012   Lab Results  Component Value Date   TSH 1.684 11/13/2013   TSH 1.092 11/03/2012    Therapeutic Level Labs: No results found for: LITHIUM No results found for: VALPROATE No components found for:  CBMZ  Current Medications: Current Outpatient Medications  Medication Sig Dispense Refill  . acetaminophen (TYLENOL) 500 MG tablet Take 500 mg by mouth 2 (two) times daily as needed (pain).    Marland Kitchen aspirin EC 81 MG tablet Take 81 mg by mouth every other day.     . Cholecalciferol (VITAMIN D) 2000 units tablet Take 2,000 Units by mouth daily.    . fluorometholone (FML) 0.1 % ophthalmic ointment 1 application 4 (four) times daily.    Marland Kitchen LORazepam (ATIVAN) 1 MG tablet TAKE 2 TABLETS BY MOUTH 3 TIMES A DAY AS NEEDED 180 tablet 2  . OLANZapine (ZYPREXA) 5 MG tablet Take 1 tablet (5 mg total) by mouth at bedtime for 30 doses. 90 tablet 1  . venlafaxine XR (EFFEXOR-XR) 150 MG 24 hr capsule Take 2 capsules (300 mg total) by mouth daily. 180 capsule 1  . hydroxychloroquine (PLAQUENIL) 200 MG tablet TAKE 1 TABLET AT BEDTIME (Patient not taking: Reported on 05/15/2017) 90 tablet 1   No current facility-administered medications for this visit.      Musculoskeletal: Strength & Muscle Tone: within normal limits Gait & Station: normal Patient leans: N/A  Psychiatric Specialty Exam: ROS  Blood pressure 126/76, pulse 80, height 5\' 5"  (1.651 m), weight 165 lb (74.8 kg).Body mass index is 27.46 kg/m.  General Appearance: Casual and Well Groomed  Eye Contact:  Good  Speech:  Clear and Coherent and Normal Rate  Volume:  Normal  Mood:  Euthymic   Affect:  Appropriate and Congruent  Thought Process:  Goal Directed and Descriptions of Associations: Intact  Orientation:  Full (Time, Place, and Person)  Thought Content: Logical   Suicidal Thoughts:  No  Homicidal Thoughts:  No  Memory:  Immediate;   Good  Judgement:  Good  Insight:  Good  Psychomotor Activity:  Normal  Concentration:  Concentration: Good  Recall:  Good  Fund of Knowledge: Good  Language: Good  Akathisia:  Negative  Handed:  Right  AIMS (if indicated): not done  Assets:  Communication Skills Desire for Improvement Financial Resources/Insurance Housing Resilience Social Support Transportation Vocational/Educational  ADL's:  Intact  Cognition: WNL  Sleep:  Good   Screenings: PHQ2-9     Nutrition from 05/15/2017 in [facility]  PHQ-2 Total Score  2  PHQ-9 Total Score  5       Assessment and Plan:  Brianna Ross is a 62 year old female with severe major depressive disorder complicated by chronic catatonia which has been gradually resolving over the past year.  She has done quite well with the tapering of Ativan gradually over the past few months.  She does not present with any suicidality and has shown an incredible resiliency in the face of multiple life stressors and tragedies.   We will follow-up in 6 weeks or sooner if needed.  Patient has an excellent support system and lives in a retirement community locally.  1. Catatonia   2. Severe episode of recurrent major depressive disorder, without psychotic features ([place])     Status of current problems: stable  Labs Ordered: No orders of the defined types were placed in this encounter.   Labs Reviewed: NA  Collateral Obtained/Records Reviewed: NA  Plan:  Continue Effexor 300 mg extended release Continue Zyprexa 5 mg nightly Reduce Ativan to 0.5 mg BID + maintain 2 mg QHS Reducing Ativan from total daily dose of 4 mg to 3 mg  Follow-up in 6 weeks  Aundra Dubin, MD 07/19/2017, 9:53 AM

## 2017-07-25 ENCOUNTER — Ambulatory Visit (INDEPENDENT_AMBULATORY_CARE_PROVIDER_SITE_OTHER): Payer: Self-pay

## 2017-07-25 ENCOUNTER — Encounter: Payer: Self-pay | Admitting: Rheumatology

## 2017-07-25 ENCOUNTER — Ambulatory Visit: Payer: BLUE CROSS/BLUE SHIELD | Admitting: Rheumatology

## 2017-07-25 VITALS — BP 115/74 | HR 92 | Resp 14 | Ht 65.0 in | Wt 166.0 lb

## 2017-07-25 DIAGNOSIS — Z79899 Other long term (current) drug therapy: Secondary | ICD-10-CM | POA: Diagnosis not present

## 2017-07-25 DIAGNOSIS — M25561 Pain in right knee: Secondary | ICD-10-CM | POA: Diagnosis not present

## 2017-07-25 DIAGNOSIS — M25562 Pain in left knee: Secondary | ICD-10-CM

## 2017-07-25 DIAGNOSIS — M3500 Sicca syndrome, unspecified: Secondary | ICD-10-CM

## 2017-07-25 DIAGNOSIS — Z13820 Encounter for screening for osteoporosis: Secondary | ICD-10-CM

## 2017-07-25 DIAGNOSIS — Z8619 Personal history of other infectious and parasitic diseases: Secondary | ICD-10-CM

## 2017-07-25 DIAGNOSIS — L719 Rosacea, unspecified: Secondary | ICD-10-CM

## 2017-07-25 DIAGNOSIS — F322 Major depressive disorder, single episode, severe without psychotic features: Secondary | ICD-10-CM

## 2017-07-25 DIAGNOSIS — Z8669 Personal history of other diseases of the nervous system and sense organs: Secondary | ICD-10-CM

## 2017-07-25 DIAGNOSIS — M359 Systemic involvement of connective tissue, unspecified: Secondary | ICD-10-CM

## 2017-07-25 DIAGNOSIS — M19042 Primary osteoarthritis, left hand: Secondary | ICD-10-CM

## 2017-07-25 DIAGNOSIS — Z8639 Personal history of other endocrine, nutritional and metabolic disease: Secondary | ICD-10-CM

## 2017-07-25 DIAGNOSIS — D8989 Other specified disorders involving the immune mechanism, not elsewhere classified: Secondary | ICD-10-CM

## 2017-07-25 DIAGNOSIS — M19041 Primary osteoarthritis, right hand: Secondary | ICD-10-CM | POA: Diagnosis not present

## 2017-07-25 DIAGNOSIS — G8929 Other chronic pain: Secondary | ICD-10-CM

## 2017-07-25 MED ORDER — DICLOFENAC SODIUM 1 % TD GEL: TRANSDERMAL | 3 refills | Status: DC

## 2017-07-25 NOTE — Patient Instructions (Signed)

## 2017-07-26 LAB — COMPLETE METABOLIC PANEL WITH GFR
AG RATIO: 2 (calc) (ref 1.0–2.5)
ALT: 34 U/L — ABNORMAL HIGH (ref 6–29)
AST: 22 U/L (ref 10–35)
Albumin: 4.3 g/dL (ref 3.6–5.1)
Alkaline phosphatase (APISO): 64 U/L (ref 33–130)
BUN: 12 mg/dL (ref 7–25)
CALCIUM: 9.8 mg/dL (ref 8.6–10.4)
CO2: 29 mmol/L (ref 20–32)
Chloride: 106 mmol/L (ref 98–110)
Creat: 0.69 mg/dL (ref 0.50–0.99)
GFR, EST AFRICAN AMERICAN: 110 mL/min/{1.73_m2} (ref >=60)
GFR, EST NON AFRICAN AMERICAN: 95 mL/min/{1.73_m2} (ref >=60)
GLOBULIN: 2.1 g/dL (ref 1.9–3.7)
Glucose, Bld: 77 mg/dL (ref 65–99)
POTASSIUM: 4.4 mmol/L (ref 3.5–5.3)
SODIUM: 142 mmol/L (ref 135–146)
TOTAL PROTEIN: 6.4 g/dL (ref 6.1–8.1)
Total Bilirubin: 0.4 mg/dL (ref 0.2–1.2)

## 2017-07-26 LAB — C3 AND C4
C3 COMPLEMENT: 120 mg/dL (ref 83–193)
C4 COMPLEMENT: 17 mg/dL (ref 15–57)

## 2017-07-26 LAB — URINALYSIS, ROUTINE W REFLEX MICROSCOPIC
BACTERIA UA: NONE SEEN /HPF
Bilirubin Urine: NEGATIVE
GLUCOSE, UA: NEGATIVE
HYALINE CAST: NONE SEEN /LPF
Hgb urine dipstick: NEGATIVE
Ketones, ur: NEGATIVE
Nitrite: NEGATIVE
PROTEIN: NEGATIVE
RBC / HPF: NONE SEEN /HPF (ref 0–2)
SQUAMOUS EPITHELIAL / LPF: NONE SEEN /HPF (ref <=5)
Specific Gravity, Urine: 1.01 (ref 1.001–1.03)
WBC, UA: NONE SEEN /HPF (ref 0–5)
pH: 6.5 (ref 5.0–8.0)

## 2017-07-26 LAB — CARDIOLIPIN ANTIBODIES, IGG, IGM, IGA
Anticardiolipin IgA: <11 [APL'U]
Anticardiolipin IgG: 59 [GPL'U] — ABNORMAL HIGH
Anticardiolipin IgM: 28 [MPL'U] — ABNORMAL HIGH

## 2017-07-26 LAB — BETA-2 GLYCOPROTEIN ANTIBODIES
Beta-2 Glyco 1 IgA: 28 SAU — ABNORMAL HIGH (ref <=20)
Beta-2 Glyco 1 IgM: 54 SMU — ABNORMAL HIGH (ref <=20)
Beta-2 Glyco I IgG: 47 SGU — ABNORMAL HIGH (ref <=20)

## 2017-07-26 LAB — CBC WITH DIFFERENTIAL/PLATELET
Basophils Absolute: 70 cells/uL (ref 0–200)
Basophils Relative: 1.2 %
EOS ABS: 249 {cells}/uL (ref 15–500)
Eosinophils Relative: 4.3 %
HCT: 40.5 % (ref 35.0–45.0)
HEMOGLOBIN: 14 g/dL (ref 11.7–15.5)
Lymphs Abs: 1653 cells/uL (ref 850–3900)
MCH: 30.5 pg (ref 27.0–33.0)
MCHC: 34.6 g/dL (ref 32.0–36.0)
MCV: 88.2 fL (ref 80.0–100.0)
MONOS PCT: 12.2 %
MPV: 9.9 fL (ref 7.5–12.5)
Neutro Abs: 3120 cells/uL (ref 1500–7800)
Neutrophils Relative %: 53.8 %
PLATELETS: 253 10*3/uL (ref 140–400)
RBC: 4.59 10*6/uL (ref 3.80–5.10)
RDW: 13 % (ref 11.0–15.0)
TOTAL LYMPHOCYTE: 28.5 %
WBC mixed population: 708 cells/uL (ref 200–950)
WBC: 5.8 10*3/uL (ref 3.8–10.8)

## 2017-07-26 LAB — ANTI-DNA ANTIBODY, DOUBLE-STRANDED: DS DNA AB: 2 [IU]/mL

## 2017-07-26 LAB — SEDIMENTATION RATE: SED RATE: 6 mm/h (ref 0–30)

## 2017-07-29 ENCOUNTER — Ambulatory Visit (INDEPENDENT_AMBULATORY_CARE_PROVIDER_SITE_OTHER): Payer: BLUE CROSS/BLUE SHIELD | Admitting: Psychology

## 2017-07-29 DIAGNOSIS — F332 Major depressive disorder, recurrent severe without psychotic features: Secondary | ICD-10-CM

## 2017-07-29 NOTE — Progress Notes (Signed)
Patient has hx of autoimmune disease and discontinued PLQ in January 2019 due to clinically doing well.  Please advise.

## 2017-07-30 ENCOUNTER — Telehealth: Payer: Self-pay | Admitting: *Deleted

## 2017-07-30 DIAGNOSIS — M359 Systemic involvement of connective tissue, unspecified: Secondary | ICD-10-CM

## 2017-07-30 DIAGNOSIS — Z79899 Other long term (current) drug therapy: Secondary | ICD-10-CM

## 2017-07-30 MED ORDER — HYDROXYCHLOROQUINE SULFATE 200 MG PO TABS: ORAL_TABLET | ORAL | 2 refills | Status: DC

## 2017-07-30 NOTE — Telephone Encounter (Signed)
-----   Message from Bo Merino, MD sent at 07/30/2017 12:39 PM EDT ----- I detailed conversation with the patient regarding her labs.  There has been an increase in her anticardiolipin antibodies and beta-2 antibodies.  She was in agreement to restart on Plaquenil.  Please call in prescription for Plaquenil 200 mg p.o. tw ice daily Monday through Friday.  Patient should have repeat labs in 3 months which should include CBC CMP, anticardiolipin, beta-2 GP 1.  She has been taking aspirin.

## 2017-07-30 NOTE — Progress Notes (Signed)
I detailed conversation with the patient regarding her labs.  There has been an increase in her anticardiolipin antibodies and beta-2 antibodies.  She was in agreement to restart on Plaquenil.  Please call in prescription for Plaquenil 200 mg p.o. twice daily Monday through Friday.  Patient should have repeat labs in 3 months which should include CBC CMP, anticardiolipin, beta-2 GP 1.  She has been taking aspirin.

## 2017-07-30 NOTE — Telephone Encounter (Signed)
Prior Authorization submitted via cover meds for Voltaren Gel. Will update when decision is reached.

## 2017-08-01 ENCOUNTER — Telehealth: Payer: Self-pay | Admitting: Rheumatology

## 2017-08-01 ENCOUNTER — Ambulatory Visit: Payer: BLUE CROSS/BLUE SHIELD | Admitting: Psychology

## 2017-08-01 NOTE — Telephone Encounter (Signed)
[facility] pharmacy left a message requesting status of prior authorization for patients Voltaren Gel.

## 2017-08-01 NOTE — Telephone Encounter (Signed)
Prior authorization has been approved as of 07/30/2017. Pharmacy has been notified.

## 2017-08-08 ENCOUNTER — Encounter: Payer: Self-pay | Admitting: Rheumatology

## 2017-08-08 DIAGNOSIS — M8589 Other specified disorders of bone density and structure, multiple sites: Secondary | ICD-10-CM | POA: Diagnosis not present

## 2017-08-08 DIAGNOSIS — Z780 Asymptomatic menopausal state: Secondary | ICD-10-CM | POA: Diagnosis not present

## 2017-08-12 ENCOUNTER — Telehealth: Payer: Self-pay

## 2017-08-12 NOTE — Telephone Encounter (Signed)
Bone Density:   T-score: -2.3 BMD: 0.598  Patient has been advised to continue taking calcium, vitamin D and doing exercises.   Patient verbalized understanding.

## 2017-08-15 ENCOUNTER — Ambulatory Visit: Payer: BLUE CROSS/BLUE SHIELD | Admitting: Neurology

## 2017-08-15 ENCOUNTER — Encounter: Payer: Self-pay | Admitting: Neurology

## 2017-08-15 VITALS — BP 115/79 | HR 90 | Ht 64.5 in | Wt 166.0 lb

## 2017-08-15 DIAGNOSIS — Z634 Disappearance and death of family member: Secondary | ICD-10-CM | POA: Diagnosis not present

## 2017-08-15 DIAGNOSIS — F4321 Adjustment disorder with depressed mood: Secondary | ICD-10-CM | POA: Insufficient documentation

## 2017-08-15 DIAGNOSIS — G4733 Obstructive sleep apnea (adult) (pediatric): Secondary | ICD-10-CM

## 2017-08-15 DIAGNOSIS — F4329 Adjustment disorder with other symptoms: Secondary | ICD-10-CM

## 2017-08-15 DIAGNOSIS — F324 Major depressive disorder, single episode, in partial remission: Secondary | ICD-10-CM | POA: Insufficient documentation

## 2017-08-15 NOTE — Progress Notes (Addendum)
[facility]   Provider:  Larey Seat, M D  Referring Provider: Lanice Shirts, * Primary Care Physician:  Lanice Shirts, MD  Chief Complaint  Patient presents with  . Follow-up    pt alone, rm 11. pt states that she is  been using the machine compliantly again for about 6-8 weeks. DME [facility].  She states that she got new supplies and states that it is working well.  pt brought her SD card to download  but the card reads as " corrupt" . We instructed the patient to bring the machine in and I can read it for her with one of our SD cards.  [facility] is her DME and needs to get her another card.    HPI:  Brianna Ross is a 62 y.o. female , who  is seen here as a revisit  from Dr. Coralyn Mark for BiPAP compliance.   08-15-2017, Brianna Ross is a 62 y.o. female , who  is seen here as a revisit  from Dr. Coralyn Mark for BiPAP compliance. She has been severely depressed before her husband took his life, but she is now working on becoming BiPAP compliant again. Her card was read as " corrupted ' . She has all her supplies through [facility].   Brianna Ross has attended a grief and a suicide support group-she feels that the members of his group can better related to her situation, but she also has some local family support in her brother and his family.   Epworth 4 / 24 , FSS at 20/63.  She is on ativan, has less of a leaden, heavy feeling, sometimes she is sleepy after taking her prn medication. She is usually able to to what she wants to do.    Interval history from 08/14/2016. Brianna Ross reports today that she just restarted using her BiPAP / VPAPon July 31. She had suffered from severe depression and lost her husband in June to suicide. She therefore did not use the machine, feeling too depressed. Her settings have been the same for 2 years now. As she resumed CPAP just 14 days ago she has been 80% compliant for these 14 days with an average user time of 6 hours and 40 minutes, inspiratory  pressure 13 cm water expiratory pressure is set at 8 cm water she has high air leaks probably due to the age of the mask which gets brittle. She hasn't had a fresh mask in a year. She has a residual AHI of 6 which probably has to be corrected for air leak related errors.  I will ask her to restart using with a fresh mask- FFM medium,        I first encountered Brianna Ross after she endorsed excessive daytime sleepiness in the setting of some depression for over 30 years.  She had initially endorsed the  Epworth sleepiness score at 15 points, a very high reading.  She underwent a polysomnography  Was diagnsoed with OSA and then return for a titration study on 3-2 -14 . The  patient had insomnia but also became evident that she needed to be on CPAP  Therapy, as her AHI was 57.9 per hour of sleep.  The patient then started on BiPAP with 13 cm water over 8 cm water. During the titration study she was able to sleep 67 minutes of which 10 minutes were rapid eye movement sleep. Her last download from 06-19-12 showed a  user time of 6 hours and 48 minutes.  She responded being  refreshed and endorsed the fatigue severity scale at 12 points only and the Epworth sleepiness score at 4 points down from 15. Brianna Ross's VPAP shows a residual AHI of 8.3, a respiratory rate of 14 per minute tidal volume of 9 20 mm . inspiratory pressure of 13  centimeter water , expiratory pressure 8 cm water .The  time of use of BiPAP/ VPAP is 6 hours and 51 minutes , with  94% compliance.Her sleep habits are as follows: She goes to bed at 10 Pm , falls asleep promptly after medication and sleeps through until 6.15  AM . She has an alarm clock set .  She drinks 1 cup of coffee in the morning, and 2 cups of tea in daytime.  She fees well and has been "loving " CPAP sleep, but the mask took some getting used to.   Interval history from 08-11-14, Brianna Ross has a new primary care physician, Dr. Jake Bathe.  Her headaches and her sleep habits   Are stable, nothing has changed and this is a routine revisit.  In spring  She fought a severe depressive episode , but her sleep normalized by June- July. Epworth sleepiness score is endorsed at 5 points and fatigue severity score at 15 points. CPAP compliance is 80% for over 4 hours of daily use average time of use is 5 hours and 40 minutes during a brief period of depression in spring she was partially noncompliant with her CPAP use but now she has begun using it again. Her AHI is 4.8 which is sufficient and she does have moderate to high air leaks. She received a new mask and gear 3 weeks ago.   Interval history from 08/10/2015, I have the pleasure of seeing Brianna Ross today in a follow-up visit for compliance with her BiPAP-ASV. The patient has 97% compliance for daily use an average of 7 hours and 2 minutes, she has an inspiratory pressure support of 13 expiratory pressure of 8 cm water and a residual AHI of 12.4 which is a little high. The residual apnea seem to be all obstructive in nature I have also noticed a significant amount of new air leaks. She feels also that she has to change interface it would probably be best to address the leak first before we do any other changes to her settings. She endorsed only 5 points on the Epworth sleepiness score and 10 on the fatigue severity score, so clinically she is doing very well.    Review of Systems: Out of a complete 14 system review, the patient complains of only the following symptoms, and all other reviewed systems are negative.  Morning headaches have completely resolved sleepiness is much improved attention is improved. She has nor nocturia and on CPAP. She endorsed the Epworth sleepiness score today at  4 from 7 points, fatigue severity at  20 from 35 points and the geriatric depression scale at 4 out of 15 points. She lost weight.      Social History   Socioeconomic History  . Marital status: Married    Spouse name: Sonny  .  Number of children: 0  . Years of education: 70  . Highest education level: Not on file  Occupational History    Employer: [employer]  Social Needs  . Financial resource strain: Not on file  . Food insecurity:    Worry: Not on file    Inability: Not on file  . Transportation needs:  Medical: Not on file    Non-medical: Not on file  Tobacco Use  . Smoking status: Never Smoker  . Smokeless tobacco: Never Used  Substance and Sexual Activity  . Alcohol use: No    Comment: rarely  . Drug use: Never  . Sexual activity: Not Currently    Birth control/protection: Post-menopausal  Lifestyle  . Physical activity:    Days per week: Not on file    Minutes per session: Not on file  . Stress: Not on file  Relationships  . Social connections:    Talks on phone: Not on file    Gets together: Not on file    Attends religious service: Not on file    Active member of club or organization: Not on file    Attends meetings of clubs or organizations: Not on file    Relationship status: Not on file  . Intimate partner violence:    Fear of current or ex partner: Not on file    Emotionally abused: Not on file    Physically abused: Not on file    Forced sexual activity: Not on file  Other Topics Concern  . Not on file  Social History Narrative   Patient works for [employer].(full-time_    Patient lives at home with her husband Brianna Ross).   Patient does not have any children.   Patient is right-handed.   Patient drinks one cup of coffee everyday and two cups of tea daily.    Family History  Problem Relation Age of Onset  . Glaucoma Mother   . Hyperlipidemia Mother   . Depression Mother   . Osteoporosis Mother   . Heart disease Father   . Hyperlipidemia Father   . Hypertension Father   . Heart defect Sister   . Heart disease Sister 13       congenital heart defect,deceased  . Hyperlipidemia Brother   . Depression Brother   . High Cholesterol  Brother   . Cancer Paternal Grandmother   . Stroke Paternal Grandfather   . Heart disease Paternal Grandfather     Past Medical History:  Diagnosis Date  . Abnormal stress electrocardiogram test   . Allergy   . ANA positive   . Atypical nevi   . Depression   . Headache(784.0)   . Hives 2011   x5  . Hyperlipidemia   . Menopause   . OSA (obstructive sleep apnea)   . Right wrist fracture   . Rosacea   . Shingles 03/2012  . Shortness of breath   . Sleep apnea   . Urticaria     Past Surgical History:  Procedure Laterality Date  . ORIF WRIST FRACTURE    . thymic cyst     removal  . WISDOM TOOTH EXTRACTION      Current Outpatient Medications  Medication Sig Dispense Refill  . acetaminophen (TYLENOL) 500 MG tablet Take 500 mg by mouth 2 (two) times daily as needed (pain).    Marland Kitchen aspirin EC 81 MG tablet Take 81 mg by mouth every other day.     . Calcium 500 MG tablet Take 2 tablets by mouth daily.    . Cholecalciferol (VITAMIN D) 2000 units tablet Take 2,800 Units by mouth daily.     . diclofenac sodium (VOLTAREN) 1 % GEL Apply 3 g to 3 large joints up to 3 times daily. 3 Tube 3  . fluorometholone (FML) 0.1 % ophthalmic suspension SHAKE LQ AND INT 1 GTT IN OS  D  5  . hydroxychloroquine (PLAQUENIL) 200 MG tablet Take 1 tablet po twice daily Monday-Friday 40 tablet 2  . LORazepam (ATIVAN) 1 MG tablet TAKE 2 TABLETS BY MOUTH 3 TIMES A DAY AS NEEDED 180 tablet 2  . valACYclovir (VALTREX) 1000 MG tablet Take 1,000 mg by mouth daily.    Marland Kitchen venlafaxine XR (EFFEXOR-XR) 150 MG 24 hr capsule Take 2 capsules (300 mg total) by mouth daily. 180 capsule 1  . OLANZapine (ZYPREXA) 5 MG tablet Take 1 tablet (5 mg total) by mouth at bedtime for 30 doses. 90 tablet 1   No current facility-administered medications for this visit.     Allergies as of 08/15/2017  . (No Known Allergies)    Vitals: BP 115/79   Pulse 90   Ht 5' 4.5" (1.638 m)   Wt 166 lb (75.3 kg)   BMI 28.05 kg/m  Last  Weight:  Wt Readings from Last 1 Encounters:  08/15/17 166 lb (75.3 kg)       Last Height:   Ht Readings from Last 1 Encounters:  08/15/17 5' 4.5" (1.638 m)    Assessment:  1) complex OSA, clinically well responding to  VPAP- I have changed her prescription to an AIR TOUCH F 20  medium size.   The patient was advised of the nature of the diagnosed sleep disorder , the treatment options and risks for general a health and wellness arising from not treating the condition. Visit duration was 15 minutes.   Plan:  Treatment plan and additional workup :  re-start and continue BipAP use, RV in 12 month with me.  [facility] to [facility] about new mask order medium size full facemask, air touch by ResMed, F 20. Medium size      Asencion Partridge Kai Railsback MD  08/15/2017              [facility]   Provider:  Larey Seat, M D  Referring Provider: Lanice Shirts, * Primary Care Physician:  Lanice Shirts, MD  Chief Complaint  Patient presents with  . Follow-up    pt alone, rm 11. pt states that she is only been using the machine again for about 5 weeks. DME [facility]. she states that she got new supplies and states that it is working well. pt brought her card to download  but the card read as corrupt. instructed the patient to bring the machine in and i can read it for her with one of my cards. also informed her to get [facility] to get her another card     HPI:  Brianna Ross is a 62 y.o. female , who  is seen here as a revisit  from Dr. Coralyn Mark for BiPAP compliance.   Interval history from 08/14/2016. Brianna Ross reports today that she just restarted using her BiPAP / VPAPon July 31. She had suffered from severe depression and lost her husband in June to suicide. She therefore did not use the machine, feeling too depressed. Her settings have been the same for 2 years now. As she resumed CPAP just 14 days ago she has been 80% compliant for these 14 days with an average  user time of 6 hours and 40 minutes, inspiratory pressure 13 cm water expiratory pressure is set at 8 cm water she has high air leaks probably due to the age of the mask which gets brittle. She hasn't had a fresh mask in a year. She has a residual AHI of 6  which probably has to be corrected for air leak related errors.  I will ask her to restart using with a fresh mask- FFM medium,        I first encountered Brianna Ross after she endorsed excessive daytime sleepiness in the setting of some depression for over 30 years.  She had initially endorsed the  Epworth sleepiness score at 15 points, a very high reading.  She underwent a polysomnography  Was diagnsoed with OSA and then return for a titration study on 3-2 -14 . The  patient had insomnia but also became evident that she needed to be on CPAP  Therapy, as her AHI was 57.9 per hour of sleep.  The patient then started on BiPAP with 13 cm water over 8 cm water. During the titration study she was able to sleep 67 minutes of which 10 minutes were rapid eye movement sleep. Her last download from 06-19-12 showed a  user time of 6 hours and 48 minutes. She responded being  refreshed and endorsed the fatigue severity scale at 12 points only and the Epworth sleepiness score at 4 points down from 15. Brianna Ross VPAP shows a residual AHI of 8.3, a respiratory rate of 14 per minute tidal volume of 9 20 mm . inspiratory pressure of 13  centimeter water , expiratory pressure 8 cm water .The  time of use of BiPAP/ VPAP is 6 hours and 51 minutes , with  94% compliance.Her sleep habits are as follows: She goes to bed at 11 Pm , falls asleep promptly and sleeps through until 6 AM . She has an alarm clock set .  She drinks 1 cup of coffee in the morning, and 2 cups of tea in daytime.  She fees well and has been "loving " CPAP sleep, but the mask took some getting used to.   Interval history from 08-11-14, Brianna Ross has a new primary care physician, Dr. Jake Bathe.  Her  headaches and her sleep habits  Are stable, nothing has changed and this is a routine revisit.  In spring  She fought a severe depressive episode , but her sleep normalized by June- July. Epworth sleepiness score is endorsed at 5 points and fatigue severity score at 15 points. CPAP compliance is 80% for over 4 hours of daily use average time of use is 5 hours and 40 minutes during a brief period of depression in spring she was partially noncompliant with her CPAP use but now she has begun using it again. Her AHI is 4.8 which is sufficient and she does have moderate to high air leaks. She received a new mask and gear 3 weeks ago.   Interval history from 08/10/2015, I have the pleasure of seeing Brianna Ross today in a follow-up visit for compliance with her BiPAP-ASV. The patient has 97% compliance for daily use an average of 7 hours and 2 minutes, she has an inspiratory pressure support of 13 expiratory pressure of 8 cm water and a residual AHI of 12.4 which is a little high. The residual apnea seem to be all obstructive in nature I have also noticed a significant amount of new air leaks. She feels also that she has to change interface it would probably be best to address the leak first before we do any other changes to her settings. She endorsed only 5 points on the Epworth sleepiness score and 10 on the fatigue severity score, so clinically she is doing very well.    Review of  Systems: Out of a complete 14 system review, the patient complains of only the following symptoms, and all other reviewed systems are negative.  The patient compliance has exceeded 79%  for PAP but only for 14 out of 90 days.  Morning headaches have completely resolved sleepiness is much improved attention is improved. She has nor nocturia and on CPAP. She endorsed the Epworth sleepiness score today at 7 points, fatigue severity at 35 points and the geriatric depression scale at 30 out of 15 points. She lost weight.       Social History   Socioeconomic History  . Marital status: Married    Spouse name: Sonny  . Number of children: 0  . Years of education: 28  . Highest education level: Not on file  Occupational History    Employer: [employer]  Social Needs  . Financial resource strain: Not on file  . Food insecurity:    Worry: Not on file    Inability: Not on file  . Transportation needs:    Medical: Not on file    Non-medical: Not on file  Tobacco Use  . Smoking status: Never Smoker  . Smokeless tobacco: Never Used  Substance and Sexual Activity  . Alcohol use: No    Comment: rarely  . Drug use: Never  . Sexual activity: Not Currently    Birth control/protection: Post-menopausal  Lifestyle  . Physical activity:    Days per week: Not on file    Minutes per session: Not on file  . Stress: Not on file  Relationships  . Social connections:    Talks on phone: Not on file    Gets together: Not on file    Attends religious service: Not on file    Active member of club or organization: Not on file    Attends meetings of clubs or organizations: Not on file    Relationship status: Not on file  . Intimate partner violence:    Fear of current or ex partner: Not on file    Emotionally abused: Not on file    Physically abused: Not on file    Forced sexual activity: Not on file  Other Topics Concern  . Not on file  Social History Narrative   Patient works for [employer].(full-time_    Patient lives at home with her husband Brianna Ross).   Patient does not have any children.   Patient is right-handed.   Patient drinks one cup of coffee everyday and two cups of tea daily.    Family History  Problem Relation Age of Onset  . Glaucoma Mother   . Hyperlipidemia Mother   . Depression Mother   . Osteoporosis Mother   . Heart disease Father   . Hyperlipidemia Father   . Hypertension Father   . Heart defect Sister   . Heart disease Sister 13        congenital heart defect,deceased  . Hyperlipidemia Brother   . Depression Brother   . High Cholesterol Brother   . Cancer Paternal Grandmother   . Stroke Paternal Grandfather   . Heart disease Paternal Grandfather     Past Medical History:  Diagnosis Date  . Abnormal stress electrocardiogram test   . Allergy   . ANA positive   . Atypical nevi   . Depression   . Headache(784.0)   . Hives 2011   x5  . Hyperlipidemia   . Menopause   . OSA (obstructive sleep apnea)   . Right  wrist fracture   . Rosacea   . Shingles 03/2012  . Shortness of breath   . Sleep apnea   . Urticaria     Past Surgical History:  Procedure Laterality Date  . ORIF WRIST FRACTURE    . thymic cyst     removal  . WISDOM TOOTH EXTRACTION      Current Outpatient Medications  Medication Sig Dispense Refill  . acetaminophen (TYLENOL) 500 MG tablet Take 500 mg by mouth 2 (two) times daily as needed (pain).    Marland Kitchen aspirin EC 81 MG tablet Take 81 mg by mouth every other day.     . Calcium 500 MG tablet Take 2 tablets by mouth daily.    . Cholecalciferol (VITAMIN D) 2000 units tablet Take 2,800 Units by mouth daily.     . diclofenac sodium (VOLTAREN) 1 % GEL Apply 3 g to 3 large joints up to 3 times daily. 3 Tube 3  . fluorometholone (FML) 0.1 % ophthalmic suspension SHAKE LQ AND INT 1 GTT IN OS D  5  . hydroxychloroquine (PLAQUENIL) 200 MG tablet Take 1 tablet po twice daily Monday-Friday 40 tablet 2  . LORazepam (ATIVAN) 1 MG tablet TAKE 2 TABLETS BY MOUTH 3 TIMES A DAY AS NEEDED 180 tablet 2  . valACYclovir (VALTREX) 1000 MG tablet Take 1,000 mg by mouth daily.    Marland Kitchen venlafaxine XR (EFFEXOR-XR) 150 MG 24 hr capsule Take 2 capsules (300 mg total) by mouth daily. 180 capsule 1  . OLANZapine (ZYPREXA) 5 MG tablet Take 1 tablet (5 mg total) by mouth at bedtime for 30 doses. 90 tablet 1   No current facility-administered medications for this visit.     Allergies as of 08/15/2017  . (No Known Allergies)     Vitals: BP 115/79   Pulse 90   Ht 5' 4.5" (1.638 m)   Wt 166 lb (75.3 kg)   BMI 28.05 kg/m  Last Weight:  Wt Readings from Last 1 Encounters:  08/15/17 166 lb (75.3 kg)       Last Height:   Ht Readings from Last 1 Encounters:  08/15/17 5' 4.5" (1.638 m)    Assessment:     1) complex OSA, clinically well responding to  VPAP- I have changed her prescription to an AIR TOUCH F 20  medium size.   2)Major depressive disorder on Cyprexa, ativan- causing weight gain also complicated by grief.   The patient was advised of the nature of the diagnosed sleep disorder , the treatment options and risks for general a health and wellness arising from not treating the condition. Visit duration was 15 minutes.   Plan:  Treatment plan and additional workup :  re-start and continue BipAP use, RV in 12 month with me. We will need your machine to obtain a reading.    Notes to [facility] about new SD card being needed, and she just received supplies and new  mask order medium size full facemask, air touch by ResMed, F 20. Medium size    Asencion Partridge Vetta Couzens MD  08/15/2017  Cc Dr. Coralyn Mark, MD   PS addendum : I was able to obtain a download from the patient's BiPAP machine for the last 30 days.  The average use of time was 5 hours and 23 minutes average use of time on days used 6 hours and 13 minutes but the patient was only able to use the machine for over 4 hours consecutively on 20 out of 30 days  thus lowering her compliance to 67%.  Her AHI was very high surprisingly 22.9, 12 of these apneas were obstructive and 7.7 central in nature.  In addition the CF very high air leakage.  The patient may need a fresh mask and interface for better air seal, I also spoke to my nurse and we will increase her inspiratory pressure to 14 cmH2O with the hope to reduce her obstructive apneas.  If this is not exercise.  I may have to ask the patient to come in for an ASV titration.  I also will ask her  durable medical equipment company, [facility], to see if she needs a chinstrap to decrease oral air leakage.  Larey Seat, MD 08-19-2017

## 2017-08-26 DIAGNOSIS — F329 Major depressive disorder, single episode, unspecified: Secondary | ICD-10-CM | POA: Diagnosis not present

## 2017-08-26 DIAGNOSIS — E559 Vitamin D deficiency, unspecified: Secondary | ICD-10-CM | POA: Diagnosis not present

## 2017-08-26 DIAGNOSIS — G4733 Obstructive sleep apnea (adult) (pediatric): Secondary | ICD-10-CM | POA: Diagnosis not present

## 2017-08-26 DIAGNOSIS — E785 Hyperlipidemia, unspecified: Secondary | ICD-10-CM | POA: Diagnosis not present

## 2017-08-27 ENCOUNTER — Ambulatory Visit (INDEPENDENT_AMBULATORY_CARE_PROVIDER_SITE_OTHER): Payer: BLUE CROSS/BLUE SHIELD | Admitting: Psychology

## 2017-08-27 DIAGNOSIS — F332 Major depressive disorder, recurrent severe without psychotic features: Secondary | ICD-10-CM

## 2017-09-02 DIAGNOSIS — F332 Major depressive disorder, recurrent severe without psychotic features: Secondary | ICD-10-CM | POA: Diagnosis not present

## 2017-10-10 DIAGNOSIS — L111 Transient acantholytic dermatosis [Grover]: Secondary | ICD-10-CM | POA: Diagnosis not present

## 2017-10-10 DIAGNOSIS — L821 Other seborrheic keratosis: Secondary | ICD-10-CM | POA: Diagnosis not present

## 2017-10-10 DIAGNOSIS — L719 Rosacea, unspecified: Secondary | ICD-10-CM | POA: Diagnosis not present

## 2017-10-10 DIAGNOSIS — Z23 Encounter for immunization: Secondary | ICD-10-CM | POA: Diagnosis not present

## 2017-10-10 DIAGNOSIS — L304 Erythema intertrigo: Secondary | ICD-10-CM | POA: Diagnosis not present

## 2017-10-11 ENCOUNTER — Ambulatory Visit (INDEPENDENT_AMBULATORY_CARE_PROVIDER_SITE_OTHER): Payer: BLUE CROSS/BLUE SHIELD | Admitting: Psychology

## 2017-10-11 DIAGNOSIS — F332 Major depressive disorder, recurrent severe without psychotic features: Secondary | ICD-10-CM

## 2017-10-15 ENCOUNTER — Other Ambulatory Visit: Payer: Self-pay | Admitting: Rheumatology

## 2017-10-15 NOTE — Telephone Encounter (Signed)
Last Visit: 07/25/17 Next Visit: 01/22/18  Labs: 07/25/17 ALT 34  PLQ Eye Exam: 01/03/17 WNL  Okay to refill per Dr. Estanislado Pandy

## 2017-10-17 DIAGNOSIS — F3341 Major depressive disorder, recurrent, in partial remission: Secondary | ICD-10-CM | POA: Diagnosis not present

## 2017-11-06 ENCOUNTER — Ambulatory Visit: Payer: BLUE CROSS/BLUE SHIELD | Admitting: Psychology

## 2017-11-13 ENCOUNTER — Ambulatory Visit (INDEPENDENT_AMBULATORY_CARE_PROVIDER_SITE_OTHER): Payer: BLUE CROSS/BLUE SHIELD | Admitting: Psychology

## 2017-11-13 DIAGNOSIS — F332 Major depressive disorder, recurrent severe without psychotic features: Secondary | ICD-10-CM

## 2017-12-02 DIAGNOSIS — F3341 Major depressive disorder, recurrent, in partial remission: Secondary | ICD-10-CM | POA: Diagnosis not present

## 2017-12-04 ENCOUNTER — Other Ambulatory Visit: Payer: Self-pay | Admitting: Rheumatology

## 2017-12-04 DIAGNOSIS — M359 Systemic involvement of connective tissue, unspecified: Secondary | ICD-10-CM

## 2017-12-04 DIAGNOSIS — Z8669 Personal history of other diseases of the nervous system and sense organs: Secondary | ICD-10-CM | POA: Diagnosis not present

## 2017-12-04 DIAGNOSIS — H2513 Age-related nuclear cataract, bilateral: Secondary | ICD-10-CM | POA: Diagnosis not present

## 2017-12-04 DIAGNOSIS — H11823 Conjunctivochalasis, bilateral: Secondary | ICD-10-CM | POA: Diagnosis not present

## 2017-12-04 DIAGNOSIS — Z79899 Other long term (current) drug therapy: Secondary | ICD-10-CM

## 2017-12-04 DIAGNOSIS — B0239 Other herpes zoster eye disease: Secondary | ICD-10-CM | POA: Diagnosis not present

## 2017-12-06 ENCOUNTER — Ambulatory Visit (INDEPENDENT_AMBULATORY_CARE_PROVIDER_SITE_OTHER): Payer: BLUE CROSS/BLUE SHIELD | Admitting: Psychology

## 2017-12-06 DIAGNOSIS — F332 Major depressive disorder, recurrent severe without psychotic features: Secondary | ICD-10-CM | POA: Diagnosis not present

## 2017-12-06 LAB — CBC WITH DIFFERENTIAL/PLATELET
BASOS ABS: 69 {cells}/uL (ref 0–200)
Basophils Relative: 1.4 %
EOS ABS: 211 {cells}/uL (ref 15–500)
EOS PCT: 4.3 %
HCT: 41.5 % (ref 35.0–45.0)
Hemoglobin: 14.2 g/dL (ref 11.7–15.5)
Lymphs Abs: 1240 cells/uL (ref 850–3900)
MCH: 30.7 pg (ref 27.0–33.0)
MCHC: 34.2 g/dL (ref 32.0–36.0)
MCV: 89.6 fL (ref 80.0–100.0)
MONOS PCT: 11.7 %
MPV: 9.9 fL (ref 7.5–12.5)
NEUTROS PCT: 57.3 %
Neutro Abs: 2808 cells/uL (ref 1500–7800)
PLATELETS: 289 10*3/uL (ref 140–400)
RBC: 4.63 10*6/uL (ref 3.80–5.10)
RDW: 12.9 % (ref 11.0–15.0)
TOTAL LYMPHOCYTE: 25.3 %
WBC mixed population: 573 cells/uL (ref 200–950)
WBC: 4.9 10*3/uL (ref 3.8–10.8)

## 2017-12-06 LAB — COMPLETE METABOLIC PANEL WITH GFR
AG RATIO: 2 (calc) (ref 1.0–2.5)
ALT: 32 U/L — AB (ref 6–29)
AST: 27 U/L (ref 10–35)
Albumin: 4.3 g/dL (ref 3.6–5.1)
Alkaline phosphatase (APISO): 66 U/L (ref 33–130)
BILIRUBIN TOTAL: 0.3 mg/dL (ref 0.2–1.2)
BUN: 10 mg/dL (ref 7–25)
CALCIUM: 10.1 mg/dL (ref 8.6–10.4)
CHLORIDE: 107 mmol/L (ref 98–110)
CO2: 29 mmol/L (ref 20–32)
Creat: 0.87 mg/dL (ref 0.50–0.99)
GFR, EST AFRICAN AMERICAN: 83 mL/min/{1.73_m2} (ref >=60)
GFR, EST NON AFRICAN AMERICAN: 72 mL/min/{1.73_m2} (ref >=60)
Globulin: 2.1 g/dL (calc) (ref 1.9–3.7)
Glucose, Bld: 75 mg/dL (ref 65–99)
POTASSIUM: 4.1 mmol/L (ref 3.5–5.3)
Sodium: 145 mmol/L (ref 135–146)
TOTAL PROTEIN: 6.4 g/dL (ref 6.1–8.1)

## 2017-12-06 LAB — BETA-2 GLYCOPROTEIN ANTIBODIES
BETA 2 GLYCO 1 IGA: 25 SAU — AB (ref <=20)
BETA 2 GLYCO 1 IGM: 39 SMU — AB (ref <=20)
BETA 2 GLYCO I IGG: 57 SGU — AB (ref <=20)

## 2017-12-06 LAB — CARDIOLIPIN ANTIBODIES, IGG, IGM, IGA
ANTICARDIOLIPIN IGM: 22 [MPL'U] — AB
Anticardiolipin IgA: <11 [APL'U]
Anticardiolipin IgG: 59 [GPL'U] — ABNORMAL HIGH

## 2017-12-06 NOTE — Progress Notes (Signed)
Patient is stopped Plaquenil herself.  She has been taking aspirin.  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  I would recommend going back on Plaquenil to decrease the antibody titers.

## 2017-12-09 ENCOUNTER — Other Ambulatory Visit: Payer: Self-pay

## 2017-12-09 DIAGNOSIS — M359 Systemic involvement of connective tissue, unspecified: Secondary | ICD-10-CM

## 2018-01-02 DIAGNOSIS — F3341 Major depressive disorder, recurrent, in partial remission: Secondary | ICD-10-CM | POA: Diagnosis not present

## 2018-01-03 ENCOUNTER — Other Ambulatory Visit: Payer: Self-pay | Admitting: *Deleted

## 2018-01-03 DIAGNOSIS — M359 Systemic involvement of connective tissue, unspecified: Secondary | ICD-10-CM

## 2018-01-06 ENCOUNTER — Other Ambulatory Visit: Payer: Self-pay | Admitting: Rheumatology

## 2018-01-06 LAB — COMPLETE METABOLIC PANEL WITH GFR
AG Ratio: 2.1 (calc) (ref 1.0–2.5)
ALBUMIN MSPROF: 4.2 g/dL (ref 3.6–5.1)
ALKALINE PHOSPHATASE (APISO): 59 U/L (ref 33–130)
ALT: 26 U/L (ref 6–29)
AST: 20 U/L (ref 10–35)
BILIRUBIN TOTAL: 0.5 mg/dL (ref 0.2–1.2)
BUN: 13 mg/dL (ref 7–25)
CHLORIDE: 106 mmol/L (ref 98–110)
CO2: 25 mmol/L (ref 20–32)
Calcium: 9.7 mg/dL (ref 8.6–10.4)
Creat: 0.78 mg/dL (ref 0.50–0.99)
GFR, Est African American: 95 mL/min/{1.73_m2} (ref >=60)
GFR, Est Non African American: 82 mL/min/{1.73_m2} (ref >=60)
GLOBULIN: 2 g/dL (ref 1.9–3.7)
GLUCOSE: 91 mg/dL (ref 65–99)
Potassium: 4.2 mmol/L (ref 3.5–5.3)
SODIUM: 141 mmol/L (ref 135–146)
Total Protein: 6.2 g/dL (ref 6.1–8.1)

## 2018-01-06 LAB — CARDIOLIPIN ANTIBODIES, IGG, IGM, IGA
ANTICARDIOLIPIN IGG: 47 [GPL'U] — AB
Anticardiolipin IgM: 29 [MPL'U] — ABNORMAL HIGH

## 2018-01-06 LAB — BETA-2 GLYCOPROTEIN ANTIBODIES
BETA 2 GLYCO 1 IGA: 22 SAU — AB (ref <=20)
Beta-2 Glyco 1 IgM: 27 SMU — ABNORMAL HIGH (ref <=20)
Beta-2 Glyco I IgG: 50 SGU — ABNORMAL HIGH (ref <=20)

## 2018-01-06 NOTE — Telephone Encounter (Signed)
Last Visit: 07/25/17 Next Visit: 01/22/18  Labs: 12/04/17 ALT 32 PLQ Eye Exam: 01/03/17 WNL  Patient advised she is due to update PLQ eye exam this month. Patient has appointment schedule for next week.   Okay to refill per Dr. Estanislado Pandy

## 2018-01-06 NOTE — Progress Notes (Signed)
CMP WNL

## 2018-01-07 ENCOUNTER — Ambulatory Visit (INDEPENDENT_AMBULATORY_CARE_PROVIDER_SITE_OTHER): Payer: BLUE CROSS/BLUE SHIELD | Admitting: Psychology

## 2018-01-07 DIAGNOSIS — F332 Major depressive disorder, recurrent severe without psychotic features: Secondary | ICD-10-CM

## 2018-01-07 NOTE — Progress Notes (Signed)
Please advise patient to continue taking PLQ as prescribed and Aspirin 81 mg every other day.

## 2018-01-08 NOTE — Progress Notes (Signed)
Office Visit Note  Patient: Brianna Ross             Date of Birth: 09-18-1955           MRN: [id number]             PCP: Lanice Shirts, MD Referring: Lanice Shirts, * Visit Date: 01/22/2018 Occupation: @GUAROCC @  Subjective:  Pain in both knees    History of Present Illness: Brianna Ross is a 63 y.o. female with history of autoimmune disease and osteoarthritis.  She is on PLQ 200 mg BID M-F.  She had a PLQ eye exam on 01/16/18.  She has been taking Aspirin 81 mg po daily.  She denies any recent flares of autoimmune disease.  She denies any recent new rashes, sun sensitivity, hair loss, symptoms of Raynaud's, sicca symptoms, fatigue, fevers, palpitations or SOB. She continues to have chronic rosacea and applies topical cream which helps.  She reports she continues to have pain in both knee joints, especially when going down steps or getting up from a seated position.  She denies any joint swelling. She uses voltaren gel topically PRN for pain relief.  She has been unable to walk for exercise recently due to the discomfort in her knee joints.  She had x-rays of both knees on 07/25/17.    Activities of Daily Living:  Patient reports morning stiffness for 0 minutes.   Patient Denies nocturnal pain.  Difficulty dressing/grooming: Denies Difficulty climbing stairs: Reports Difficulty getting out of chair: Reports Difficulty using hands for taps, buttons, cutlery, and/or writing: Denies  Review of Systems  Constitutional: Negative for fatigue.  HENT: Negative for mouth sores, mouth dryness and nose dryness.   Eyes: Negative for pain, visual disturbance and dryness.  Respiratory: Negative for cough, hemoptysis, shortness of breath and difficulty breathing.   Cardiovascular: Negative for chest pain, palpitations, hypertension and swelling in legs/feet.  Gastrointestinal: Negative for blood in stool, constipation and diarrhea.  Endocrine: Negative for increased urination.    Genitourinary: Negative for painful urination.  Musculoskeletal: Positive for arthralgias and joint pain. Negative for joint swelling, myalgias, muscle weakness, morning stiffness, muscle tenderness and myalgias.  Skin: Negative for color change, pallor, rash, hair loss, nodules/bumps, skin tightness, ulcers and sensitivity to sunlight.  Allergic/Immunologic: Negative for susceptible to infections.  Neurological: Negative for dizziness, numbness, headaches and weakness.  Hematological: Negative for swollen glands.  Psychiatric/Behavioral: Positive for depressed mood. Negative for sleep disturbance. The patient is not nervous/anxious.     PMFS History:  Patient Active Problem List   Diagnosis Date Noted  . 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 Complicated grief 25/85/2778  . Primary osteoarthritis of both hands 02/27/2016  . High risk medication use 02/20/2016  . Autoimmune disease (HCC)positive ANA, hypocomplementemia, positive beta 2 and positive anticardiolipin antibodies.  02/20/2016  . Sicca syndrome, unspecified ([place]) 02/20/2016  . History of sleep apnea 02/20/2016  . 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 OSA on CPAP 08/11/2014  . Hearing loss 06/29/2014  . OSA treated with BiPAP 08/06/2013  . Hyperlipidemia 04/29/2013  . 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 Family history of heart disease 12/10/2012  . 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 Positive ANA (antinuclear antibody) 03/06/2012  . Elevated aldolase level 03/06/2012  . Atypical nevi 11/14/2011  . Menopause 11/13/2011  . Rosacea 11/13/2011    Past Medical History:  Diagnosis Date  . Abnormal stress electrocardiogram test   .  Allergy   . ANA positive   . Atypical nevi   . Depression   . Headache(784.0)   . Hives 2011   x5  . Hyperlipidemia   . Menopause   . OSA (obstructive sleep apnea)   . Right wrist fracture    . Rosacea   . Shingles 03/2012  . Shortness of breath   . Sleep apnea   . Urticaria     Family History  Problem Relation Age of Onset  . Glaucoma Mother   . Hyperlipidemia Mother   . Depression Mother   . Osteoporosis Mother   . Heart disease Father   . Hyperlipidemia Father   . Hypertension Father   . Heart defect Sister   . Heart disease Sister 13       congenital heart defect,deceased  . Hyperlipidemia Brother   . Depression Brother   . High Cholesterol Brother   . Cancer Paternal Grandmother   . Stroke Paternal Grandfather   . Heart disease Paternal Grandfather    Past Surgical History:  Procedure Laterality Date  . ORIF WRIST FRACTURE    . thymic cyst     removal  . WISDOM TOOTH EXTRACTION     Social History   Social History Narrative   Patient works for [employer].(full-time_    Patient lives at home with her husband Brianna Ross).   Patient does not have any children.   Patient is right-handed.   Patient drinks one cup of coffee everyday and two cups of tea daily.    Objective: Vital Signs: BP 123/82 (BP Location: Left Arm, Patient Position: Sitting, Cuff Size: Normal)   Pulse (!) 103   Resp 13   Ht 5' 4.5" (1.638 m)   Wt 168 lb (76.2 kg)   BMI 28.39 kg/m    Physical Exam Vitals signs and nursing note reviewed.  Constitutional:      Appearance: She is well-developed.  HENT:     Head: Normocephalic and atraumatic.  Eyes:     Conjunctiva/sclera: Conjunctivae normal.  Neck:     Musculoskeletal: Normal range of motion.  Cardiovascular:     Rate and Rhythm: Normal rate and regular rhythm.     Heart sounds: Normal heart sounds.  Pulmonary:     Effort: Pulmonary effort is normal.     Breath sounds: Normal breath sounds.  Abdominal:     General: Bowel sounds are normal.     Palpations: Abdomen is soft.  Lymphadenopathy:     Cervical: No cervical adenopathy.  Skin:    General: Skin is warm and dry.     Capillary Refill: Capillary  refill takes less than 2 seconds.  Neurological:     Mental Status: She is alert and oriented to person, place, and time.  Psychiatric:        Behavior: Behavior normal.      Musculoskeletal Exam:   CDAI Exam: CDAI Score: Not documented Patient Global Assessment: Not documented; Provider Global Assessment: Not documented Swollen: Not documented; Tender: Not documented Joint Exam   Not documented   There is currently no information documented on the homunculus. Go to the Rheumatology activity and complete the homunculus joint exam.  Investigation: No additional findings.  Imaging: No results found.  Recent Labs: Lab Results  Component Value Date   WBC 4.9 12/04/2017   HGB 14.2 12/04/2017   PLT 289 12/04/2017   NA 141 01/03/2018   K 4.2 01/03/2018   CL 106 01/03/2018   CO2 25  01/03/2018   GLUCOSE 91 01/03/2018   BUN 13 01/03/2018   CREATININE 0.78 01/03/2018   BILITOT 0.5 01/03/2018   ALKPHOS 48 04/25/2016   AST 20 01/03/2018   ALT 26 01/03/2018   PROT 6.2 01/03/2018   ALBUMIN 4.9 04/25/2016   CALCIUM 9.7 01/03/2018   GFRAA 95 01/03/2018    Speciality Comments: PLQ Eye Exam:01/16/18 WNL @ [facility]  Procedures:  No procedures performed Allergies: Patient has no known allergies.     Assessment / Plan:     Visit Diagnoses: Autoimmune disease (HCC)positive ANA, hypocomplementemia, positive beta 2 and positive anticardiolipin antibodies.  -She has no clinical features of autoimmune disease at this.  She is clinically doing well on PLQ 200 mg BID M-F and Aspirin 81 mg po daily.  She has no synovitis on exam.  She has chronic pain in both knee joints and uses voltaren gel topically PRN for pain relief. She has not had any recent flares of autoimmune disease.  She denies any recent new rashes, sun sensitivity, hair loss, symptoms of Raynaud's, sicca symptoms, fatigue, fevers, palpitations or SOB.  No parotid swelling was noted.  No digital ulcerations or  signs of gangrene.  No oral or nasal ulcerations noted on exam.  She will continue on the current treatment regimen.  She does not need refills at this time. She was advised to notify us if she develops new or worsening symptoms.  We will check autoimmune labs in 3 months. Future orders were placed today.  She will follow up in 5 months.   High risk medication use - Plaquenil 200 mg twice daily Monday through Friday.  Last Plaquenil eye exam normal on 01/16/2018.  Most recent CMP within normal limits on 01/03/2018 and most recent CBC within normal limits on 12/04/2017.  She will return in 3 months to repeat autoimmune labs.  Future orders for these labs were placed today.  Patient received Pneumovax 23.  She has not had any recent infections.   Sicca syndrome, unspecified ([place]): She has no sicca symptoms at this time.  She has no parotid swelling.    Primary osteoarthritis of both hands: She has mild PIP and DIP synovial thickening.  She has tenderness of bilateral 2nd PIP joints. She has no synovitis on exam.  Joint protection and muscle strengthening were discussed.   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  Osteoporosis screening - DEXA 08/08/2017 BMD: 0.598, T-score: -2.3.  She takes a calcium and vitamin D supplement daily.   Other medical conditions are listed as follows:   History of sleep apnea  History of herpes zoster keratoconjunctivitis  Rosacea  History of hyperlipidemia   Orders: Orders Placed This Encounter  Procedures  . CBC with Differential/Platelet  . COMPLETE METABOLIC PANEL WITH GFR  . Urinalysis, Routine w reflex microscopic  . ANA  . Anti-DNA antibody, double-stranded  . C3 and C4  . Sedimentation rate  . Beta-2 glycoprotein antibodies  . Cardiolipin antibodies, IgG, IgM, IgA   No orders of the defined types were placed in this encounter.     Follow-Up  Instructions: Return in about 5 months (around 06/23/2018) for Autoimmune Disease, Osteoarthritis.   Ofilia Neas, PA-C   I examined and evaluated the patient with Hazel Sams PA.  He has no synovitis on examination today.  Her sicca symptoms are manageable.  She has been on Plaquenil and aspirin which she has  been tolerating well.  The plan of care was discussed as noted above.  Bo Merino, MD  Note - This record has been created using Editor, commissioning.  Chart creation errors have been sought, but may not always  have been located. Such creation errors do not reflect on  the standard of medical care.

## 2018-01-16 DIAGNOSIS — Z79899 Other long term (current) drug therapy: Secondary | ICD-10-CM | POA: Diagnosis not present

## 2018-01-16 DIAGNOSIS — H1789 Other corneal scars and opacities: Secondary | ICD-10-CM | POA: Diagnosis not present

## 2018-01-16 DIAGNOSIS — H5213 Myopia, bilateral: Secondary | ICD-10-CM | POA: Diagnosis not present

## 2018-01-16 DIAGNOSIS — H25042 Posterior subcapsular polar age-related cataract, left eye: Secondary | ICD-10-CM | POA: Diagnosis not present

## 2018-01-22 ENCOUNTER — Ambulatory Visit: Payer: BLUE CROSS/BLUE SHIELD | Admitting: Rheumatology

## 2018-01-22 ENCOUNTER — Encounter: Payer: Self-pay | Admitting: Rheumatology

## 2018-01-22 VITALS — BP 123/82 | HR 103 | Resp 13 | Ht 64.5 in | Wt 168.0 lb

## 2018-01-22 DIAGNOSIS — M19041 Primary osteoarthritis, right hand: Secondary | ICD-10-CM | POA: Diagnosis not present

## 2018-01-22 DIAGNOSIS — M359 Systemic involvement of connective tissue, unspecified: Secondary | ICD-10-CM | POA: Diagnosis not present

## 2018-01-22 DIAGNOSIS — M3500 Sicca syndrome, unspecified: Secondary | ICD-10-CM

## 2018-01-22 DIAGNOSIS — L719 Rosacea, unspecified: Secondary | ICD-10-CM

## 2018-01-22 DIAGNOSIS — Z8639 Personal history of other endocrine, nutritional and metabolic disease: Secondary | ICD-10-CM

## 2018-01-22 DIAGNOSIS — Z79899 Other long term (current) drug therapy: Secondary | ICD-10-CM

## 2018-01-22 DIAGNOSIS — Z13820 Encounter for screening for osteoporosis: Secondary | ICD-10-CM

## 2018-01-22 DIAGNOSIS — Z8669 Personal history of other diseases of the nervous system and sense organs: Secondary | ICD-10-CM

## 2018-01-22 DIAGNOSIS — F322 Major depressive disorder, single episode, severe without psychotic features: Secondary | ICD-10-CM

## 2018-01-22 DIAGNOSIS — M19042 Primary osteoarthritis, left hand: Secondary | ICD-10-CM

## 2018-01-22 DIAGNOSIS — Z8619 Personal history of other infectious and parasitic diseases: Secondary | ICD-10-CM

## 2018-01-22 NOTE — Patient Instructions (Signed)
Standing Labs We placed an order today for your standing lab work.    Please come back and get your standing labs in 3 months   ANA, dsDNA, complements, sed rate, CBC, CMP, UA, beta 2, cardiolipin antibodies   We have open lab Monday through Friday from 8:30-11:30 AM and 1:30-4:00 PM  at the office of Dr. Bo Merino.   You may experience shorter wait times on Monday and Friday afternoons. The office is located at [address], [place], [place], [place] [address] No appointment is necessary.   Labs are drawn by [facility].  You may receive a bill from [facility] for your lab work.  If you wish to have your labs drawn at another location, please call the office 24 hours in advance to send orders.  If you have any questions regarding directions or hours of operation,  please call [phone].   Just as a reminder please drink plenty of water prior to coming for your lab work. Thanks!

## 2018-01-28 DIAGNOSIS — F3341 Major depressive disorder, recurrent, in partial remission: Secondary | ICD-10-CM | POA: Diagnosis not present

## 2018-02-07 NOTE — Progress Notes (Signed)
Please check if we have the eye exam form from the ophthalmologist.

## 2018-02-11 DIAGNOSIS — F3341 Major depressive disorder, recurrent, in partial remission: Secondary | ICD-10-CM | POA: Diagnosis not present

## 2018-02-18 ENCOUNTER — Ambulatory Visit (INDEPENDENT_AMBULATORY_CARE_PROVIDER_SITE_OTHER): Payer: BLUE CROSS/BLUE SHIELD | Admitting: Psychology

## 2018-02-18 DIAGNOSIS — F332 Major depressive disorder, recurrent severe without psychotic features: Secondary | ICD-10-CM | POA: Diagnosis not present

## 2018-02-26 DIAGNOSIS — F3341 Major depressive disorder, recurrent, in partial remission: Secondary | ICD-10-CM | POA: Diagnosis not present

## 2018-02-28 DIAGNOSIS — Z803 Family history of malignant neoplasm of breast: Secondary | ICD-10-CM | POA: Diagnosis not present

## 2018-02-28 DIAGNOSIS — Z1231 Encounter for screening mammogram for malignant neoplasm of breast: Secondary | ICD-10-CM | POA: Diagnosis not present

## 2018-03-07 DIAGNOSIS — F3341 Major depressive disorder, recurrent, in partial remission: Secondary | ICD-10-CM | POA: Diagnosis not present

## 2018-03-21 ENCOUNTER — Other Ambulatory Visit: Payer: Self-pay

## 2018-03-21 MED ORDER — HYDROXYCHLOROQUINE SULFATE 200 MG PO TABS: ORAL_TABLET | ORAL | 0 refills | Status: DC

## 2018-03-21 NOTE — Progress Notes (Signed)
Okay to refill PLQ per Dr. Estanislado Pandy. Patient has been notified.

## 2018-03-27 ENCOUNTER — Ambulatory Visit: Payer: BLUE CROSS/BLUE SHIELD | Admitting: Psychology

## 2018-03-28 ENCOUNTER — Other Ambulatory Visit: Payer: Self-pay | Admitting: Rheumatology

## 2018-03-28 NOTE — Telephone Encounter (Signed)
Last Visit: 01/22/18 Next visit: 06/24/18 Labs: 01/03/18  WNL PLQ Eye Exam:01/16/18 WNL   Okay to refill per Dr. Estanislado Pandy

## 2018-04-25 DIAGNOSIS — F3341 Major depressive disorder, recurrent, in partial remission: Secondary | ICD-10-CM | POA: Diagnosis not present

## 2018-04-30 ENCOUNTER — Ambulatory Visit (INDEPENDENT_AMBULATORY_CARE_PROVIDER_SITE_OTHER): Payer: BLUE CROSS/BLUE SHIELD | Admitting: Psychology

## 2018-04-30 DIAGNOSIS — F332 Major depressive disorder, recurrent severe without psychotic features: Secondary | ICD-10-CM

## 2018-05-09 DIAGNOSIS — F3341 Major depressive disorder, recurrent, in partial remission: Secondary | ICD-10-CM | POA: Diagnosis not present

## 2018-05-29 ENCOUNTER — Ambulatory Visit (INDEPENDENT_AMBULATORY_CARE_PROVIDER_SITE_OTHER): Payer: BLUE CROSS/BLUE SHIELD | Admitting: Psychology

## 2018-05-29 DIAGNOSIS — F332 Major depressive disorder, recurrent severe without psychotic features: Secondary | ICD-10-CM

## 2018-06-03 DIAGNOSIS — F332 Major depressive disorder, recurrent severe without psychotic features: Secondary | ICD-10-CM | POA: Diagnosis not present

## 2018-06-04 DIAGNOSIS — F332 Major depressive disorder, recurrent severe without psychotic features: Secondary | ICD-10-CM | POA: Diagnosis not present

## 2018-06-05 DIAGNOSIS — F332 Major depressive disorder, recurrent severe without psychotic features: Secondary | ICD-10-CM | POA: Diagnosis not present

## 2018-06-06 DIAGNOSIS — F332 Major depressive disorder, recurrent severe without psychotic features: Secondary | ICD-10-CM | POA: Diagnosis not present

## 2018-06-09 DIAGNOSIS — F332 Major depressive disorder, recurrent severe without psychotic features: Secondary | ICD-10-CM | POA: Diagnosis not present

## 2018-06-10 DIAGNOSIS — F332 Major depressive disorder, recurrent severe without psychotic features: Secondary | ICD-10-CM | POA: Diagnosis not present

## 2018-06-10 NOTE — Progress Notes (Signed)
.   Office Visit Note  Patient: Brianna Ross             Date of Birth: [birth year]           MRN: [id number]             PCP: Leeroy Cha, MD Referring: Lanice Shirts, * Visit Date: 06/24/2018 Occupation: @GUAROCC @  Subjective:  Pain in both knees.  History of Present Illness: Akeela Busk is a 63 y.o. female with history of autoimmune disease and osteoarthritis.  She states she continues to have some discomfort in her knee joints.  She has been exercising 3 times a week to strengthen knee joint muscles.  He denies any joint swelling.  She has been tolerating Plaquenil well.  Activities of Daily Living:  Patient reports morning stiffness for 0 minutes.   Patient Denies nocturnal pain.  Difficulty dressing/grooming: Denies Difficulty climbing stairs: Denies Difficulty getting out of chair: Denies Difficulty using hands for taps, buttons, cutlery, and/or writing: Denies  Review of Systems  Constitutional: Negative for fatigue.  HENT: Negative for mouth sores, mouth dryness and nose dryness.   Eyes: Negative for itching and dryness.  Respiratory: Negative for shortness of breath, wheezing and difficulty breathing.   Cardiovascular: Negative for chest pain, palpitations and swelling in legs/feet.  Gastrointestinal: Negative for abdominal pain, constipation and diarrhea.  Endocrine: Negative for increased urination.  Genitourinary: Negative for painful urination and pelvic pain.  Musculoskeletal: Positive for arthralgias and joint pain. Negative for joint swelling and morning stiffness.  Skin: Positive for rash. Negative for hair loss.  Allergic/Immunologic: Negative for susceptible to infections.  Neurological: Negative for dizziness, headaches and weakness.  Hematological: Negative for bruising/bleeding tendency.  Psychiatric/Behavioral: Negative for confusion.    PMFS History:  Patient Active Problem List   Diagnosis Date Noted  . Major depressive disorder in  partial remission ([place]) 08/15/2017  . Complicated grief 19/41/7408  . Primary osteoarthritis of both hands 02/27/2016  . High risk medication use 02/20/2016  . Autoimmune disease (HCC)positive ANA, hypocomplementemia, positive beta 2 and positive anticardiolipin antibodies.  02/20/2016  . Sicca syndrome, unspecified ([place]) 02/20/2016  . History of sleep apnea 02/20/2016  . 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 OSA on CPAP 08/11/2014  . Hearing loss 06/29/2014  . OSA treated with BiPAP 08/06/2013  . Hyperlipidemia 04/29/2013  . 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 Family history of heart disease 12/10/2012  . 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 Positive ANA (antinuclear antibody) 03/06/2012  . Elevated aldolase level 03/06/2012  . Atypical nevi 11/14/2011  . Menopause 11/13/2011  . Rosacea 11/13/2011    Past Medical History:  Diagnosis Date  . Abnormal stress electrocardiogram test   . Allergy   . ANA positive   . Atypical nevi   . Depression   . Headache(784.0)   . Hives 2011   x5  . Hyperlipidemia   . Menopause   . OSA (obstructive sleep apnea)   . Right wrist fracture   . Rosacea   . Shingles 03/2012  . Shortness of breath   . Sleep apnea   . Urticaria     Family History  Problem Relation Age of Onset  . Glaucoma Mother   . Hyperlipidemia Mother   . Depression Mother   . Osteoporosis Mother   . Heart disease Father   . Hyperlipidemia Father   . Hypertension Father   . Heart defect Sister   .  Heart disease Sister 13       congenital heart defect,deceased  . Hyperlipidemia Brother   . Depression Brother   . High Cholesterol Brother   . Cancer Paternal Grandmother   . Stroke Paternal Grandfather   . Heart disease Paternal Grandfather    Past Surgical History:  Procedure Laterality Date  . ORIF WRIST FRACTURE    . thymic cyst     removal  . WISDOM TOOTH EXTRACTION     Social History    Social History Narrative   Patient works for [employer].(full-time_    Patient lives at home with her husband [name]).   Patient does not have any children.   Patient is right-handed.   Patient drinks one cup of coffee everyday and two cups of tea daily.   Immunization History  Administered Date(s) Administered  . Influenza Split 11/13/2011  . Influenza,inj,Quad PF,6+ Mos 11/20/2016  . Influenza-Unspecified 09/11/2013  . Pneumococcal Polysaccharide-23 11/12/2012     Objective: Vital Signs: BP 103/77 (BP Location: Left Arm, Patient Position: Sitting, Cuff Size: Normal)   Pulse 100   Resp 14   Ht 5' 4.5" (1.638 m)   Wt 163 lb 9.6 oz (74.2 kg)   BMI 27.65 kg/m    Physical Exam Vitals signs and nursing note reviewed.  Constitutional:      Appearance: She is well-developed.  HENT:     Head: Normocephalic and atraumatic.  Eyes:     Conjunctiva/sclera: Conjunctivae normal.  Neck:     Musculoskeletal: Normal range of motion.  Cardiovascular:     Rate and Rhythm: Normal rate and regular rhythm.     Heart sounds: Normal heart sounds.  Pulmonary:     Effort: Pulmonary effort is normal.     Breath sounds: Normal breath sounds.  Abdominal:     General: Bowel sounds are normal.     Palpations: Abdomen is soft.  Lymphadenopathy:     Cervical: No cervical adenopathy.  Skin:    General: Skin is warm and dry.     Capillary Refill: Capillary refill takes less than 2 seconds.  Neurological:     Mental Status: She is alert and oriented to person, place, and time.  Psychiatric:        Behavior: Behavior normal.      Musculoskeletal Exam: C-spine some limitation with lateral rotation.  Shoulder joints, elbow joints, wrist joints, MCPs PIPs and DIPs with good range of motion.  She has mild PIP and DIP thickening in her hands consistent with osteoarthritis.  Hip joints knee joints ankles MTPs PIPs been good range of motion.  She has come crepitus with range of  motion of bilateral knee joints.  CDAI Exam: CDAI Score: - Patient Global: -; Provider Global: - Swollen: -; Tender: - Joint Exam   No joint exam has been documented for this visit   There is currently no information documented on the homunculus. Go to the Rheumatology activity and complete the homunculus joint exam.  Investigation: No additional findings.  Imaging: No results found.  Recent Labs: Lab Results  Component Value Date   WBC 4.9 12/04/2017   HGB 14.2 12/04/2017   PLT 289 12/04/2017   NA 141 01/03/2018   K 4.2 01/03/2018   CL 106 01/03/2018   CO2 25 01/03/2018   GLUCOSE 91 01/03/2018   BUN 13 01/03/2018   CREATININE 0.78 01/03/2018   BILITOT 0.5 01/03/2018   ALKPHOS 48 04/25/2016   AST 20 01/03/2018   ALT 26  01/03/2018   PROT 6.2 01/03/2018   ALBUMIN 4.9 04/25/2016   CALCIUM 9.7 01/03/2018   GFRAA 95 01/03/2018  June 11, 2018 labs from her PCPs office CMP normal except ALT of 55, hepatitis B-, hepatitis C negative  Speciality Comments: PLQ Eye Exam:01/16/18 WNL @ [facility]  Procedures:  No procedures performed Allergies: Patient has no known allergies.   Assessment / Plan:     Visit Diagnoses: Autoimmune disease (HCC)positive ANA, hypocomplementemia, positive beta 2 and positive anticardiolipin antibodies.  -She is doing quite well without any new symptoms.  She has been tolerating Plaquenil well.  She continues to have some sicca symptoms.  She has been taking baby aspirin every other day.   Sicca syndrome ([place]) - Plan: Over-the-counter products were discussed.  High risk medication use - Plaquenil 200 mg 1 tablet twice daily Monday through Friday only.  Last Plaquenil eye exam normal on 01/16/2018.  Most recent CBC within normal limits on 12/04/2017.  Most recent CMP within normal limits on 01/03/2018.  Due for CBC/CMP today and will monitor every 5 months.  Standing orders are in place. - Plan: CBC with Differential/Platelet, she had mild  elevation of LFTs.  Avoidance of Tylenol and Voltaren gel was discussed.  She will have repeat LFTs done by her PCP per patient.  Primary osteoarthritis of both hands -joint protection was discussed.  Primary osteoarthritis of both knees - Moderate osteoarthritis and moderate chondromalacia patella -she has been trying to do some exercises.  Weight loss diet and exercise was emphasized.  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 CBC with Differential/Platelet   No orders of the defined types were placed in this encounter.   Face-to-face time spent with patient was 25 minutes. Greater than 50% of time was spent in counseling and coordination of care.  Follow-Up Instructions: Return for Autoimmune disease, Osteoarthritis.   Bo Merino, MD  Note - This record has been created using Editor, commissioning.  Chart creation errors have been sought, but may not always  have been located. Such creation errors do not reflect on  the standard of medical care.

## 2018-06-11 DIAGNOSIS — F332 Major depressive disorder, recurrent severe without psychotic features: Secondary | ICD-10-CM | POA: Diagnosis not present

## 2018-06-11 DIAGNOSIS — E785 Hyperlipidemia, unspecified: Secondary | ICD-10-CM | POA: Diagnosis not present

## 2018-06-11 DIAGNOSIS — Z23 Encounter for immunization: Secondary | ICD-10-CM | POA: Diagnosis not present

## 2018-06-11 DIAGNOSIS — E559 Vitamin D deficiency, unspecified: Secondary | ICD-10-CM | POA: Diagnosis not present

## 2018-06-11 DIAGNOSIS — Z0000 Encounter for general adult medical examination without abnormal findings: Secondary | ICD-10-CM | POA: Diagnosis not present

## 2018-06-11 DIAGNOSIS — Z79899 Other long term (current) drug therapy: Secondary | ICD-10-CM | POA: Diagnosis not present

## 2018-06-11 DIAGNOSIS — F329 Major depressive disorder, single episode, unspecified: Secondary | ICD-10-CM | POA: Diagnosis not present

## 2018-06-12 DIAGNOSIS — F332 Major depressive disorder, recurrent severe without psychotic features: Secondary | ICD-10-CM | POA: Diagnosis not present

## 2018-06-13 DIAGNOSIS — F332 Major depressive disorder, recurrent severe without psychotic features: Secondary | ICD-10-CM | POA: Diagnosis not present

## 2018-06-16 DIAGNOSIS — F332 Major depressive disorder, recurrent severe without psychotic features: Secondary | ICD-10-CM | POA: Diagnosis not present

## 2018-06-17 ENCOUNTER — Other Ambulatory Visit: Payer: Self-pay

## 2018-06-17 DIAGNOSIS — M3500 Sicca syndrome, unspecified: Secondary | ICD-10-CM | POA: Diagnosis not present

## 2018-06-17 DIAGNOSIS — M359 Systemic involvement of connective tissue, unspecified: Secondary | ICD-10-CM

## 2018-06-17 DIAGNOSIS — Z79899 Other long term (current) drug therapy: Secondary | ICD-10-CM | POA: Diagnosis not present

## 2018-06-17 DIAGNOSIS — F332 Major depressive disorder, recurrent severe without psychotic features: Secondary | ICD-10-CM | POA: Diagnosis not present

## 2018-06-18 DIAGNOSIS — F3341 Major depressive disorder, recurrent, in partial remission: Secondary | ICD-10-CM | POA: Diagnosis not present

## 2018-06-19 DIAGNOSIS — F332 Major depressive disorder, recurrent severe without psychotic features: Secondary | ICD-10-CM | POA: Diagnosis not present

## 2018-06-19 LAB — C3 AND C4
C3 Complement: 133 mg/dL (ref 83–193)
C4 Complement: 26 mg/dL (ref 15–57)

## 2018-06-19 LAB — SEDIMENTATION RATE: Sed Rate: 9 mm/h (ref 0–30)

## 2018-06-19 LAB — URINALYSIS, ROUTINE W REFLEX MICROSCOPIC
Bilirubin Urine: NEGATIVE
Glucose, UA: NEGATIVE
Hgb urine dipstick: NEGATIVE
Ketones, ur: NEGATIVE
Leukocytes,Ua: NEGATIVE
Nitrite: NEGATIVE
Protein, ur: NEGATIVE
Specific Gravity, Urine: 1.009 (ref 1.001–1.03)
pH: 6.5 (ref 5.0–8.0)

## 2018-06-19 LAB — ANA: Anti Nuclear Antibody (ANA): POSITIVE — AB

## 2018-06-19 LAB — BETA-2 GLYCOPROTEIN ANTIBODIES
Beta-2 Glyco 1 IgA: 23 SAU — ABNORMAL HIGH (ref <=20)
Beta-2 Glyco 1 IgM: 23 SMU — ABNORMAL HIGH (ref <=20)
Beta-2 Glyco I IgG: 25 SGU — ABNORMAL HIGH (ref <=20)

## 2018-06-19 LAB — ANTI-DNA ANTIBODY, DOUBLE-STRANDED: ds DNA Ab: 2 IU/mL

## 2018-06-19 LAB — ANTI-NUCLEAR AB-TITER (ANA TITER): ANA Titer 1: 1:160 {titer} — ABNORMAL HIGH

## 2018-06-19 LAB — CARDIOLIPIN ANTIBODIES, IGG, IGM, IGA
Anticardiolipin IgA: <11 [APL'U]
Anticardiolipin IgG: 27 [GPL'U] — ABNORMAL HIGH
Anticardiolipin IgM: 18 [MPL'U] — ABNORMAL HIGH

## 2018-06-20 DIAGNOSIS — F332 Major depressive disorder, recurrent severe without psychotic features: Secondary | ICD-10-CM | POA: Diagnosis not present

## 2018-06-20 NOTE — Progress Notes (Signed)
Labs are stable.

## 2018-06-24 ENCOUNTER — Other Ambulatory Visit: Payer: Self-pay

## 2018-06-24 ENCOUNTER — Ambulatory Visit (INDEPENDENT_AMBULATORY_CARE_PROVIDER_SITE_OTHER): Payer: BC Managed Care – PPO | Admitting: Rheumatology

## 2018-06-24 ENCOUNTER — Encounter: Payer: Self-pay | Admitting: Rheumatology

## 2018-06-24 VITALS — BP 103/77 | HR 100 | Resp 14 | Ht 64.5 in | Wt 163.6 lb

## 2018-06-24 DIAGNOSIS — F3341 Major depressive disorder, recurrent, in partial remission: Secondary | ICD-10-CM | POA: Diagnosis not present

## 2018-06-24 DIAGNOSIS — M3500 Sicca syndrome, unspecified: Secondary | ICD-10-CM | POA: Diagnosis not present

## 2018-06-24 DIAGNOSIS — M359 Systemic involvement of connective tissue, unspecified: Secondary | ICD-10-CM

## 2018-06-24 DIAGNOSIS — Z79899 Other long term (current) drug therapy: Secondary | ICD-10-CM | POA: Diagnosis not present

## 2018-06-24 DIAGNOSIS — Z8639 Personal history of other endocrine, nutritional and metabolic disease: Secondary | ICD-10-CM

## 2018-06-24 DIAGNOSIS — M170 Bilateral primary osteoarthritis of knee: Secondary | ICD-10-CM

## 2018-06-24 DIAGNOSIS — Z8619 Personal history of other infectious and parasitic diseases: Secondary | ICD-10-CM

## 2018-06-24 DIAGNOSIS — M19041 Primary osteoarthritis, right hand: Secondary | ICD-10-CM

## 2018-06-24 DIAGNOSIS — Z8669 Personal history of other diseases of the nervous system and sense organs: Secondary | ICD-10-CM

## 2018-06-24 DIAGNOSIS — F322 Major depressive disorder, single episode, severe without psychotic features: Secondary | ICD-10-CM

## 2018-06-24 DIAGNOSIS — L719 Rosacea, unspecified: Secondary | ICD-10-CM

## 2018-06-24 DIAGNOSIS — M19042 Primary osteoarthritis, left hand: Secondary | ICD-10-CM

## 2018-06-24 LAB — CBC WITH DIFFERENTIAL/PLATELET
Absolute Monocytes: 665 cells/uL (ref 200–950)
Basophils Absolute: 61 cells/uL (ref 0–200)
Basophils Relative: 1 %
Eosinophils Absolute: 220 cells/uL (ref 15–500)
Eosinophils Relative: 3.6 %
HCT: 39.7 % (ref 35.0–45.0)
Hemoglobin: 13.5 g/dL (ref 11.7–15.5)
Lymphs Abs: 1501 cells/uL (ref 850–3900)
MCH: 30.9 pg (ref 27.0–33.0)
MCHC: 34 g/dL (ref 32.0–36.0)
MCV: 90.8 fL (ref 80.0–100.0)
MPV: 9.5 fL (ref 7.5–12.5)
Monocytes Relative: 10.9 %
Neutro Abs: 3654 cells/uL (ref 1500–7800)
Neutrophils Relative %: 59.9 %
Platelets: 253 10*3/uL (ref 140–400)
RBC: 4.37 10*6/uL (ref 3.80–5.10)
RDW: 12.8 % (ref 11.0–15.0)
Total Lymphocyte: 24.6 %
WBC: 6.1 10*3/uL (ref 3.8–10.8)

## 2018-06-25 DIAGNOSIS — F332 Major depressive disorder, recurrent severe without psychotic features: Secondary | ICD-10-CM | POA: Diagnosis not present

## 2018-06-25 NOTE — Progress Notes (Signed)
CBC is normal.

## 2018-06-26 DIAGNOSIS — F332 Major depressive disorder, recurrent severe without psychotic features: Secondary | ICD-10-CM | POA: Diagnosis not present

## 2018-06-27 DIAGNOSIS — F332 Major depressive disorder, recurrent severe without psychotic features: Secondary | ICD-10-CM | POA: Diagnosis not present

## 2018-06-30 DIAGNOSIS — F332 Major depressive disorder, recurrent severe without psychotic features: Secondary | ICD-10-CM | POA: Diagnosis not present

## 2018-07-01 DIAGNOSIS — F332 Major depressive disorder, recurrent severe without psychotic features: Secondary | ICD-10-CM | POA: Diagnosis not present

## 2018-07-02 DIAGNOSIS — F332 Major depressive disorder, recurrent severe without psychotic features: Secondary | ICD-10-CM | POA: Diagnosis not present

## 2018-07-03 DIAGNOSIS — F332 Major depressive disorder, recurrent severe without psychotic features: Secondary | ICD-10-CM | POA: Diagnosis not present

## 2018-07-04 DIAGNOSIS — F332 Major depressive disorder, recurrent severe without psychotic features: Secondary | ICD-10-CM | POA: Diagnosis not present

## 2018-07-08 DIAGNOSIS — F332 Major depressive disorder, recurrent severe without psychotic features: Secondary | ICD-10-CM | POA: Diagnosis not present

## 2018-07-09 ENCOUNTER — Other Ambulatory Visit: Payer: Self-pay | Admitting: Internal Medicine

## 2018-07-09 ENCOUNTER — Ambulatory Visit (INDEPENDENT_AMBULATORY_CARE_PROVIDER_SITE_OTHER): Payer: BC Managed Care – PPO | Admitting: Psychology

## 2018-07-09 ENCOUNTER — Other Ambulatory Visit (HOSPITAL_COMMUNITY)
Admission: RE | Admit: 2018-07-09 | Discharge: 2018-07-09 | Disposition: A | Discharge status: Home / Self Care | Payer: BC Managed Care – PPO | Source: Ambulatory Visit | Attending: Internal Medicine | Admitting: Internal Medicine

## 2018-07-09 DIAGNOSIS — F332 Major depressive disorder, recurrent severe without psychotic features: Secondary | ICD-10-CM | POA: Diagnosis not present

## 2018-07-09 DIAGNOSIS — R945 Abnormal results of liver function studies: Secondary | ICD-10-CM | POA: Diagnosis not present

## 2018-07-09 DIAGNOSIS — E7800 Pure hypercholesterolemia, unspecified: Secondary | ICD-10-CM | POA: Diagnosis not present

## 2018-07-09 DIAGNOSIS — Z124 Encounter for screening for malignant neoplasm of cervix: Secondary | ICD-10-CM | POA: Diagnosis not present

## 2018-07-09 DIAGNOSIS — Z01419 Encounter for gynecological examination (general) (routine) without abnormal findings: Secondary | ICD-10-CM | POA: Diagnosis not present

## 2018-07-10 DIAGNOSIS — F332 Major depressive disorder, recurrent severe without psychotic features: Secondary | ICD-10-CM | POA: Diagnosis not present

## 2018-07-11 DIAGNOSIS — F332 Major depressive disorder, recurrent severe without psychotic features: Secondary | ICD-10-CM | POA: Diagnosis not present

## 2018-07-14 DIAGNOSIS — F332 Major depressive disorder, recurrent severe without psychotic features: Secondary | ICD-10-CM | POA: Diagnosis not present

## 2018-07-15 DIAGNOSIS — F332 Major depressive disorder, recurrent severe without psychotic features: Secondary | ICD-10-CM | POA: Diagnosis not present

## 2018-07-15 LAB — CYTOLOGY - PAP
Diagnosis: NEGATIVE
HPV: NOT DETECTED

## 2018-07-17 DIAGNOSIS — F332 Major depressive disorder, recurrent severe without psychotic features: Secondary | ICD-10-CM | POA: Diagnosis not present

## 2018-07-18 DIAGNOSIS — F332 Major depressive disorder, recurrent severe without psychotic features: Secondary | ICD-10-CM | POA: Diagnosis not present

## 2018-07-22 DIAGNOSIS — F332 Major depressive disorder, recurrent severe without psychotic features: Secondary | ICD-10-CM | POA: Diagnosis not present

## 2018-07-23 DIAGNOSIS — F332 Major depressive disorder, recurrent severe without psychotic features: Secondary | ICD-10-CM | POA: Diagnosis not present

## 2018-07-24 DIAGNOSIS — F332 Major depressive disorder, recurrent severe without psychotic features: Secondary | ICD-10-CM | POA: Diagnosis not present

## 2018-07-25 DIAGNOSIS — F332 Major depressive disorder, recurrent severe without psychotic features: Secondary | ICD-10-CM | POA: Diagnosis not present

## 2018-08-13 DIAGNOSIS — G4700 Insomnia, unspecified: Secondary | ICD-10-CM | POA: Diagnosis not present

## 2018-08-13 DIAGNOSIS — F061 Catatonic disorder due to known physiological condition: Secondary | ICD-10-CM | POA: Diagnosis not present

## 2018-08-13 DIAGNOSIS — F332 Major depressive disorder, recurrent severe without psychotic features: Secondary | ICD-10-CM | POA: Diagnosis not present

## 2018-08-19 ENCOUNTER — Other Ambulatory Visit: Payer: Self-pay

## 2018-08-19 ENCOUNTER — Ambulatory Visit (INDEPENDENT_AMBULATORY_CARE_PROVIDER_SITE_OTHER): Payer: PPO | Admitting: Neurology

## 2018-08-19 ENCOUNTER — Encounter: Payer: Self-pay | Admitting: Neurology

## 2018-08-19 VITALS — BP 120/81 | HR 114 | Temp 98.2°F | Ht 64.5 in | Wt 173.0 lb

## 2018-08-19 DIAGNOSIS — F4329 Adjustment disorder with other symptoms: Secondary | ICD-10-CM

## 2018-08-19 DIAGNOSIS — G4731 Primary central sleep apnea: Secondary | ICD-10-CM

## 2018-08-19 DIAGNOSIS — Z634 Disappearance and death of family member: Secondary | ICD-10-CM

## 2018-08-19 DIAGNOSIS — G4733 Obstructive sleep apnea (adult) (pediatric): Secondary | ICD-10-CM

## 2018-08-19 DIAGNOSIS — F4381 Prolonged grief disorder: Secondary | ICD-10-CM

## 2018-08-19 DIAGNOSIS — F324 Major depressive disorder, single episode, in partial remission: Secondary | ICD-10-CM

## 2018-08-19 DIAGNOSIS — F4321 Adjustment disorder with depressed mood: Secondary | ICD-10-CM

## 2018-08-19 DIAGNOSIS — G4739 Other sleep apnea: Secondary | ICD-10-CM

## 2018-08-19 NOTE — Progress Notes (Signed)
SLEEP MEDICINE CLINIC   Provider:  Larey Seat, M D  Referring Provider: Lanice Shirts, * Primary Care Physician:  Leeroy Cha, MD  Chief Complaint  Patient presents with   Follow-up    pt alone, rm 11. pt states that she is  been using the machine compliantly again for about 6-8 weeks. DME [facility].  She states that she got new supplies and states that it is working well.  pt brought her SD card to download  but the card reads as " corrupt" . We instructed the patient to bring the machine in and I can read it for her with one of our SD cards.  [facility] is her DME and needs to get her another card.    HPI:  Brianna Ross is a 63 y.o. female patient who has seen last as a patient of Dr. Coralyn Mark ( now retired) for BiPAP compliance.  08-19-2018,  Treatment resistant depression, underwent ECT in 2016, when she felt it worked for her but "messed up" her memory , and left her confidence shaken. She has tried transcranial magnetic stimulation, but had no significant success.  No headaches at this time, the patient endorsed 3 points on the Epworth sleepiness score she usually endorsed 2 points for lying down to rest in the afternoon and 1 for falling asleep while watching television.  She has been a compliant BiPAP user with a setting of 13 cm over 8 cm BiPAP easy bruises on she has used the machine 30 out of 30 days last night both the night of 18 August 2018.  Average user time of 7 hours 25 minutes.  Her residual AHI however has risen to 8.7 of which half hour central and half obstructive in origin.  This residual AHI is higher than I like and she also has a lot of air leakage.  95th percentile air leak was 55.3 L/min. She has felt air leaking- and has taken the mask of at night.  She uses a mask. FFM- ResMed FFM  F 30 medium. [employer].  I will order a new F 30 I mask for her to be fitted.      08-15-2017, Brianna Ross is a 63 y.o. female , who  is seen here as a revisit  from Dr.  Coralyn Mark for BiPAP compliance. She has been severely depressed before her husband took his life, but she is now working on becoming BiPAP compliant again. Her card was read as " corrupted ' . She has all her supplies through [facility].   Brianna Ross has attended a grief and a suicide support group-she feels that the members of his group can better related to her situation, but she also has some local family support in her brother and his family.   Epworth 4 / 24 , FSS at 20/63.  She is on ativan, has less of a leaden, heavy feeling, sometimes she is sleepy after taking her prn medication. She is usually able to to what she wants to do.    Interval history from 08/14/2016. Brianna Ross reports today that she just restarted using her BiPAP / VPAPon July 31. She had suffered from severe depression and lost her husband in June to suicide. She therefore did not use the machine, feeling too depressed. Her settings have been the same for 2 years now. As she resumed CPAP just 14 days ago she has been 80% compliant for these 14 days with an average user time of 6 hours and 40  minutes, inspiratory pressure 13 cm water expiratory pressure is set at 8 cm water she has high air leaks probably due to the age of the mask which gets brittle. She hasn't had a fresh mask in a year. She has a residual AHI of 6 which probably has to be corrected for air leak related errors.  I will ask her to restart using with a fresh mask- FFM medium,      I first encountered Brianna Ross after she endorsed excessive daytime sleepiness in the setting of some depression for over 30 years.  She had initially endorsed the  Epworth sleepiness score at 15 points, a very high reading.  She underwent a polysomnography  Was diagnsoed with OSA and then return for a titration study on 3-2 -14 . The  patient had insomnia but also became evident that she needed to be on CPAP  Therapy, as her AHI was 57.9 per hour of sleep.  The patient then started on BiPAP  with 13 cm water over 8 cm water. During the titration study she was able to sleep 67 minutes of which 10 minutes were rapid eye movement sleep. Her last download from 06-19-12 showed a  user time of 6 hours and 48 minutes. She responded being  refreshed and endorsed the fatigue severity scale at 12 points only and the Epworth sleepiness score at 4 points down from 15. Brianna Ross VPAP shows a residual AHI of 8.3, a respiratory rate of 14 per minute tidal volume of 9 20 mm . inspiratory pressure of 13  centimeter water , expiratory pressure 8 cm water .The  time of use of BiPAP/ VPAP is 6 hours and 51 minutes , with  94% compliance.Her sleep habits are as follows: She goes to bed at 10 Pm , falls asleep promptly after medication and sleeps through until 6.15  AM . She has an alarm clock set .  She drinks 1 cup of coffee in the morning, and 2 cups of tea in daytime.  She fees well and has been "loving " CPAP sleep, but the mask took some getting used to.   Interval history from 08-11-14, Brianna Ross has a new primary care physician, Dr. Jake Bathe.  Her headaches and her sleep habits  Are stable, nothing has changed and this is a routine revisit.  In spring  She fought a severe depressive episode , but her sleep normalized by June- July. Epworth sleepiness score is endorsed at 5 points and fatigue severity score at 15 points. CPAP compliance is 80% for over 4 hours of daily use average time of use is 5 hours and 40 minutes during a brief period of depression in spring she was partially noncompliant with her CPAP use but now she has begun using it again. Her AHI is 4.8 which is sufficient and she does have moderate to high air leaks. She received a new mask and gear 3 weeks ago.   Interval history from 08/10/2015, I have the pleasure of seeing Brianna Ross today in a follow-up visit for compliance with her BiPAP-ASV. The patient has 97% compliance for daily use an average of 7 hours and 2 minutes, she has an  inspiratory pressure support of 13 expiratory pressure of 8 cm water and a residual AHI of 12.4 which is a little high. The residual apnea seem to be all obstructive in nature I have also noticed a significant amount of new air leaks. She feels also that she has to change interface  it would probably be best to address the leak first before we do any other changes to her settings. She endorsed only 5 points on the Epworth sleepiness score and 10 on the fatigue severity score, so clinically she is doing very well.    Review of Systems: Out of a complete 14 system review, the patient complains of only the following symptoms, and all other reviewed systems are negative.  Morning headaches have completely resolved sleepiness is much improved attention is improved. She has nor nocturia and on CPAP. She endorsed the Epworth sleepiness score today at  4 from 7 points, fatigue severity at  20 from 35 points and the geriatric depression scale at 4 out of 15 points. She lost weight.      Social History   Socioeconomic History   Marital status: Married    Spouse name: Sonny   Number of children: 0   Years of education: 16   Highest education level: Not on file  Occupational History    Employer: [employer]  Social Needs   Emergency planning/management officer strain: Not on file   Food insecurity    Worry: Not on file    Inability: Not on file   Transportation needs    Medical: Not on file    Non-medical: Not on file  Tobacco Use   Smoking status: Never Smoker   Smokeless tobacco: Never Used  Substance and Sexual Activity   Alcohol use: No    Comment: rarely   Drug use: Never   Sexual activity: Not Currently    Birth control/protection: Post-menopausal  Lifestyle   Physical activity    Days per week: Not on file    Minutes per session: Not on file   Stress: Not on file  Relationships   Social connections    Talks on phone: Not on file    Gets together: Not on file      Attends religious service: Not on file    Active member of club or organization: Not on file    Attends meetings of clubs or organizations: Not on file    Relationship status: Not on file   Intimate partner violence    Fear of current or ex partner: Not on file    Emotionally abused: Not on file    Physically abused: Not on file    Forced sexual activity: Not on file  Other Topics Concern   Not on file  Social History Narrative   Patient works for [employer].(full-time_    Patient lives at home with her husband [name]).   Patient does not have any children.   Patient is right-handed.   Patient drinks one cup of coffee everyday and two cups of tea daily.    Family History  Problem Relation Age of Onset   Glaucoma Mother    Hyperlipidemia Mother    Depression Mother    Osteoporosis Mother    Heart disease Father    Hyperlipidemia Father    Hypertension Father    Heart defect Sister    Heart disease Sister 83       congenital heart defect,deceased   Hyperlipidemia Brother    Depression Brother    High Cholesterol Brother    Cancer Paternal Grandmother    Stroke Paternal Grandfather    Heart disease Paternal Grandfather     Past Medical History:  Diagnosis Date   Abnormal stress electrocardiogram test    Allergy    ANA positive  Atypical nevi    Depression    Headache(784.0)    Hives 2011   x5   Hyperlipidemia    Menopause    OSA (obstructive sleep apnea)    Right wrist fracture    Rosacea    Shingles 03/2012   Shortness of breath    Sleep apnea    Urticaria     Past Surgical History:  Procedure Laterality Date   ORIF WRIST FRACTURE     thymic cyst     removal   WISDOM TOOTH EXTRACTION      Current Outpatient Medications  Medication Sig Dispense Refill   acetaminophen (TYLENOL) 500 MG tablet Take 500 mg by mouth 2 (two) times daily as needed (pain).     aspirin EC 81 MG tablet Take 81 mg  by mouth daily.      Calcium 500 MG tablet Take 2 tablets by mouth daily.     Cholecalciferol (VITAMIN D) 2000 units tablet Take 4,000 Units by mouth daily.      diclofenac sodium (VOLTAREN) 1 % GEL Apply 3 g to 3 large joints up to 3 times daily. 3 Tube 3   fluorometholone (FML) 0.1 % ophthalmic suspension SHAKE LQ AND INT 1 GTT IN OS D  5   hydroxychloroquine (PLAQUENIL) 200 MG tablet TAKE 1 TABLET BY MOUTH TWICE DAILY MONDAY-FRIDAY 120 tablet 1   LORazepam (ATIVAN) 2 MG tablet      mirtazapine (REMERON) 45 MG tablet Take by mouth at bedtime.      NONFORMULARY OR COMPOUNDED ITEM at bedtime.     rosuvastatin (CRESTOR) 20 MG tablet      valACYclovir (VALTREX) 1000 MG tablet Take 1,000 mg by mouth daily.     venlafaxine XR (EFFEXOR-XR) 150 MG 24 hr capsule Take 2 capsules (300 mg total) by mouth daily. 180 capsule 1   No current facility-administered medications for this visit.     Allergies as of 08/19/2018   (No Known Allergies)    Vitals: BP 120/81    Pulse (!) 114    Temp 98.2 F (36.8 C)    Ht 5' 4.5" (1.638 m)    Wt 173 lb (78.5 kg)    BMI 29.24 kg/m  Last Weight:  Wt Readings from Last 1 Encounters:  08/19/18 173 lb (78.5 kg)       Last Height:   Ht Readings from Last 1 Encounters:  08/19/18 5' 4.5" (1.638 m)    Assessment:  1) complex OSA, clinically well responding to  VPAP- I have changed her prescription to an AIR TOUCH F 20  medium size.   The patient was advised of the nature of the diagnosed sleep disorder , the treatment options and risks for general a health and wellness arising from not treating the condition. Visit duration was 15 minutes.   Plan:  Treatment plan and additional workup :  re-start and continue BipAP use, RV in 12 month with me.  [facility] about new mask order medium size full facemask, air touch by ResMed, F 20. Medium size      Brianna Partridge Sherlene Rickel MD  08/19/2018              SLEEP MEDICINE  CLINIC   Provider:  Larey Seat, M D  Referring Provider: Lanice Shirts, * Primary Care Physician:  Leeroy Cha, MD  Chief Complaint  Patient presents with   Follow-up    pt alone, rm 10. pt states things  HPI:  Brianna Ross is a 63 y.o. female , who  is seen here as a revisit  from Dr. Coralyn Mark for BiPAP compliance.   Interval history from 08/14/2016. Brianna Ross reports today that she just restarted using her BiPAP / VPAPon July 31. She had suffered from severe depression and lost her husband in June to suicide. She therefore did not use the machine, feeling too depressed. Her settings have been the same for 2 years now. As she resumed CPAP just 14 days ago she has been 80% compliant for these 14 days with an average user time of 6 hours and 40 minutes, inspiratory pressure 13 cm water expiratory pressure is set at 8 cm water she has high air leaks probably due to the age of the mask which gets brittle. She hasn't had a fresh mask in a year. She has a residual AHI of 6 which probably has to be corrected for air leak related errors.  I will ask her to restart using with a fresh mask- FFM medium,        I first encountered Brianna Ross after she endorsed excessive daytime sleepiness in the setting of some depression for over 30 years.  She had initially endorsed the  Epworth sleepiness score at 15 points, a very high reading.  She underwent a polysomnography  Was diagnsoed with OSA and then return for a titration study on 3-2 -14 . The  patient had insomnia but also became evident that she needed to be on CPAP  Therapy, as her AHI was 57.9 per hour of sleep.  The patient then started on BiPAP with 13 cm water over 8 cm water. During the titration study she was able to sleep 67 minutes of which 10 minutes were rapid eye movement sleep. Her last download from 06-19-12 showed a  user time of 6 hours and 48 minutes. She responded being  refreshed and endorsed the fatigue  severity scale at 12 points only and the Epworth sleepiness score at 4 points down from 15. Brianna Ross VPAP shows a residual AHI of 8.3, a respiratory rate of 14 per minute tidal volume of 9 20 mm . inspiratory pressure of 13  centimeter water , expiratory pressure 8 cm water .The  time of use of BiPAP/ VPAP is 6 hours and 51 minutes , with  94% compliance.Her sleep habits are as follows: She goes to bed at 11 Pm , falls asleep promptly and sleeps through until 6 AM . She has an alarm clock set .  She drinks 1 cup of coffee in the morning, and 2 cups of tea in daytime.  She fees well and has been "loving " CPAP sleep, but the mask took some getting used to.   Interval history from 08-11-14, Brianna Ross has a new primary care physician, Dr. Jake Bathe.  Her headaches and her sleep habits  Are stable, nothing has changed and this is a routine revisit.  In spring  She fought a severe depressive episode , but her sleep normalized by June- July. Epworth sleepiness score is endorsed at 5 points and fatigue severity score at 15 points. CPAP compliance is 80% for over 4 hours of daily use average time of use is 5 hours and 40 minutes during a brief period of depression in spring she was partially noncompliant with her CPAP use but now she has begun using it again. Her AHI is 4.8 which is sufficient and she does have moderate to high air leaks. She  received a new mask and gear 3 weeks ago.   Interval history from 08/10/2015, I have the pleasure of seeing Brianna Ross today in a follow-up visit for compliance with her BiPAP-ASV. The patient has 97% compliance for daily use an average of 7 hours and 2 minutes, she has an inspiratory pressure support of 13 expiratory pressure of 8 cm water and a residual AHI of 12.4 which is a little high. The residual apnea seem to be all obstructive in nature I have also noticed a significant amount of new air leaks. She feels also that she has to change interface it would probably  be best to address the leak first before we do any other changes to her settings. She endorsed only 5 points on the Epworth sleepiness score and 10 on the fatigue severity score, so clinically she is doing very well.    Review of Systems: Out of a complete 14 system review, the patient complains of only the following symptoms, and all other reviewed systems are negative.  The patient compliance has exceeded 79%  for PAP but only for 14 out of 90 days.  Morning headaches have completely resolved sleepiness is much improved attention is improved. She has nor nocturia and on CPAP. She endorsed the Epworth sleepiness score today at 7 points, fatigue severity at 35 points and the geriatric depression scale at 30 out of 15 points. She lost weight.      Social History   Socioeconomic History   Marital status: Married    Spouse name: Sonny   Number of children: 0   Years of education: 16   Highest education level: Not on file  Occupational History    Employer: [employer]  Social Needs   Emergency planning/management officer strain: Not on file   Food insecurity    Worry: Not on file    Inability: Not on file   Transportation needs    Medical: Not on file    Non-medical: Not on file  Tobacco Use   Smoking status: Never Smoker   Smokeless tobacco: Never Used  Substance and Sexual Activity   Alcohol use: No    Comment: rarely   Drug use: Never   Sexual activity: Not Currently    Birth control/protection: Post-menopausal  Lifestyle   Physical activity    Days per week: Not on file    Minutes per session: Not on file   Stress: Not on file  Relationships   Social connections    Talks on phone: Not on file    Gets together: Not on file    Attends religious service: Not on file    Active member of club or organization: Not on file    Attends meetings of clubs or organizations: Not on file    Relationship status: Not on file   Intimate partner violence    Fear  of current or ex partner: Not on file    Emotionally abused: Not on file    Physically abused: Not on file    Forced sexual activity: Not on file  Other Topics Concern   Not on file  Social History Narrative   Patient works for [employer].(full-time_    Patient lives at home with her husband [name]).   Patient does not have any children.   Patient is right-handed.   Patient drinks one cup of coffee everyday and two cups of tea daily.    Family History  Problem Relation Age of Onset  Glaucoma Mother    Hyperlipidemia Mother    Depression Mother    Osteoporosis Mother    Heart disease Father    Hyperlipidemia Father    Hypertension Father    Heart defect Sister    Heart disease Sister 39       congenital heart defect,deceased   Hyperlipidemia Brother    Depression Brother    High Cholesterol Brother    Cancer Paternal Grandmother    Stroke Paternal Grandfather    Heart disease Paternal Grandfather     Past Medical History:  Diagnosis Date   Abnormal stress electrocardiogram test    Allergy    ANA positive    Atypical nevi    Depression    Headache(784.0)    Hives 2011   x5   Hyperlipidemia    Menopause    OSA (obstructive sleep apnea)    Right wrist fracture    Rosacea    Shingles 03/2012   Shortness of breath    Sleep apnea    Urticaria     Past Surgical History:  Procedure Laterality Date   ORIF WRIST FRACTURE     thymic cyst     removal   WISDOM TOOTH EXTRACTION      Current Outpatient Medications  Medication Sig Dispense Refill   acetaminophen (TYLENOL) 500 MG tablet Take 500 mg by mouth 2 (two) times daily as needed (pain).     aspirin EC 81 MG tablet Take 81 mg by mouth daily.      Calcium 500 MG tablet Take 2 tablets by mouth daily.     Cholecalciferol (VITAMIN D) 2000 units tablet Take 4,000 Units by mouth daily.      diclofenac sodium (VOLTAREN) 1 % GEL Apply 3 g to 3 large joints  up to 3 times daily. 3 Tube 3   fluorometholone (FML) 0.1 % ophthalmic suspension SHAKE LQ AND INT 1 GTT IN OS D  5   hydroxychloroquine (PLAQUENIL) 200 MG tablet TAKE 1 TABLET BY MOUTH TWICE DAILY MONDAY-FRIDAY 120 tablet 1   LORazepam (ATIVAN) 2 MG tablet      mirtazapine (REMERON) 45 MG tablet Take by mouth at bedtime.      NONFORMULARY OR COMPOUNDED ITEM at bedtime.     rosuvastatin (CRESTOR) 20 MG tablet      valACYclovir (VALTREX) 1000 MG tablet Take 1,000 mg by mouth daily.     venlafaxine XR (EFFEXOR-XR) 150 MG 24 hr capsule Take 2 capsules (300 mg total) by mouth daily. 180 capsule 1   No current facility-administered medications for this visit.     Allergies as of 08/19/2018   (No Known Allergies)    Vitals: BP 120/81    Pulse (!) 114    Temp 98.2 F (36.8 C)    Ht 5' 4.5" (1.638 m)    Wt 173 lb (78.5 kg)    BMI 29.24 kg/m  Last Weight:  Wt Readings from Last 1 Encounters:  08/19/18 173 lb (78.5 kg)       Last Height:   Ht Readings from Last 1 Encounters:  08/19/18 5' 4.5" (1.638 m)    Assessment:     1) complex OSA, clinically well responding to BiPAP- I have changed her prescription to an AIR TOUCH F 20  medium size, but she developed a large air leak- consider changing to F 30i mask. Marland Kitchen     2)Major depressive disorder, almost chronic = on Cyprexa, ativan- causing weight gain also complicated by grief  after husbands suicide. .   The patient was advised of the nature of the diagnosed sleep disorder , the treatment options and risks for general a health and wellness arising from not treating the condition. Visit duration was 15 minutes.    Plan:  Treatment plan and additional workup :  continue BipAP use, medium size ResMed F 30 I .   RV in 12 month with Np and alternating with me.    Notes to [facility] about new SD card being needed,  and she just received supplies.  Brianna Partridge Ozella Comins MD  08/19/2018  Cc Dr. Coralyn Mark, MD

## 2018-09-03 ENCOUNTER — Ambulatory Visit (INDEPENDENT_AMBULATORY_CARE_PROVIDER_SITE_OTHER): Payer: PPO | Admitting: Psychology

## 2018-09-03 DIAGNOSIS — F332 Major depressive disorder, recurrent severe without psychotic features: Secondary | ICD-10-CM

## 2018-09-11 DIAGNOSIS — F332 Major depressive disorder, recurrent severe without psychotic features: Secondary | ICD-10-CM | POA: Diagnosis not present

## 2018-09-11 DIAGNOSIS — G4700 Insomnia, unspecified: Secondary | ICD-10-CM | POA: Diagnosis not present

## 2018-09-11 DIAGNOSIS — F061 Catatonic disorder due to known physiological condition: Secondary | ICD-10-CM | POA: Diagnosis not present

## 2018-10-03 DIAGNOSIS — F332 Major depressive disorder, recurrent severe without psychotic features: Secondary | ICD-10-CM | POA: Diagnosis not present

## 2018-10-03 DIAGNOSIS — G4700 Insomnia, unspecified: Secondary | ICD-10-CM | POA: Diagnosis not present

## 2018-10-03 DIAGNOSIS — F061 Catatonic disorder due to known physiological condition: Secondary | ICD-10-CM | POA: Diagnosis not present

## 2018-10-29 ENCOUNTER — Ambulatory Visit (INDEPENDENT_AMBULATORY_CARE_PROVIDER_SITE_OTHER): Payer: PPO | Admitting: Psychology

## 2018-10-29 DIAGNOSIS — F332 Major depressive disorder, recurrent severe without psychotic features: Secondary | ICD-10-CM

## 2018-11-04 ENCOUNTER — Telehealth: Payer: Self-pay | Admitting: Rheumatology

## 2018-11-04 NOTE — Telephone Encounter (Signed)
Patient left a voicemail requesting a return call to let her know if she needs to have labwork before her appointment on 11/25/18.

## 2018-11-06 DIAGNOSIS — G4700 Insomnia, unspecified: Secondary | ICD-10-CM | POA: Diagnosis not present

## 2018-11-06 DIAGNOSIS — F332 Major depressive disorder, recurrent severe without psychotic features: Secondary | ICD-10-CM | POA: Diagnosis not present

## 2018-11-06 DIAGNOSIS — F061 Catatonic disorder due to known physiological condition: Secondary | ICD-10-CM | POA: Diagnosis not present

## 2018-11-06 NOTE — Telephone Encounter (Signed)
Patient advised she may have labs done the day of her appointment.

## 2018-11-11 NOTE — Progress Notes (Signed)
Virtual Visit via Telephone Note  I connected with Brianna Ross on 11/25/18 at 10:15 AM EST by telephone and verified that I am speaking with the correct person using two identifiers.  Location: Patient: Home  Provider: Clinic This service was conducted via virtual visit.    The patient was located at home. I was located in my office.  Consent was obtained prior to the virtual visit and is aware of possible charges through their insurance for this visit.  The patient is an established patient.  Dr. Estanislado Pandy, MD conducted the virtual visit and Hazel Sams, PA-C acted as scribe during the service.  Office staff helped with scheduling follow up visits after the service was conducted.   I discussed the limitations, risks, security and privacy concerns of performing an evaluation and management service by telephone and the availability of in person appointments. I also discussed with the patient that there may be a patient responsible charge related to this service. The patient expressed understanding and agreed to proceed.  CC: Medication monitoring  History of Present Illness: Patient is a 63 year old female with past medical history of autoimmune disease and osteoarthritis. She is taking plaquenil 200 mg 1 tablet by mouth twice daily M-F. She is not having any signs or symptoms of a flare.  She has no joint pain or joint swelling at this time.  She has had less difficulty going down steps since her knee joint pain has improved.  She denies any morning stiffness.  She has not had any recent rashes.  No oral or nasal ulcerations.  She has chronic sicca symptoms.  She uses artifical tears prn.  She denies any other concerns at this time.   Review of Systems  Constitutional: Negative for fever and malaise/fatigue.  HENT:       +Dry mouth  Eyes: Negative for photophobia, pain, discharge and redness.       +Dry eyes  Respiratory: Negative for cough, shortness of breath and wheezing.    Cardiovascular: Negative for chest pain and palpitations.  Gastrointestinal: Negative for blood in stool, constipation and diarrhea.  Genitourinary: Negative for dysuria.  Musculoskeletal: Negative for back pain, joint pain, myalgias and neck pain.  Skin: Negative for rash.  Neurological: Negative for dizziness and headaches.  Psychiatric/Behavioral: Negative for depression. Substance abuse:   The patient is not nervous/anxious and does not have insomnia.       Observations/Objective: Physical Exam  Constitutional: She is oriented to person, place, and time.  Neurological: She is alert and oriented to person, place, and time.  Psychiatric: Mood, memory, affect and judgment normal.    Patient reports morning stiffness for 0  minutes.   Patient denies nocturnal pain.  Difficulty dressing/grooming: Denies Difficulty climbing stairs: Denies Difficulty getting out of chair: Denies Difficulty using hands for taps, buttons, cutlery, and/or writing: Denies  Assessment and Plan: Visit Diagnoses: Autoimmune disease (HCC)positive ANA, hypocomplementemia, positive beta 2 and positive anticardiolipin antibodies: She is clinically doing well on Plaquenil 200 mg 1 tablet by mouth BID M-F.  She has not had any signs or symptoms of a flare recently.  She has no joint pain or joint swelling at this time. No morning stiffness. She has not had any recent rashes, photosensitivity, or hair loss.  She has not had any symptoms of raynaud's recently.  No oral or nasal ulcerations.  She has chronic sicca symptoms.  She uses artifical tears PRN. She is not having any shortness of breath or chest  pain.  No worsening fatigue, fevers, or enlarged lymph nodes.  She is due to update lab work, so future orders were placed today.  She will continue taking PLQ as prescribed.  She was advised to notify us if she develops signs or symptoms of a flare.  She will follow up in 3-4 months.    Sicca syndrome (HCC) -She uses  artifical tears prn for eye dryness.    High risk medication use - Plaquenil 200 mg 1 tablet twice daily Monday through Friday only.  Last Plaquenil eye exam normal on 01/16/2018.  PLQ eye exam scheduled for 02/01/18.  She is due to update lab work.  Future orders were placed today.   Primary osteoarthritis of both hands: She is not having any discomfort or joint swelling at this time. Joint protection and muscle strengthening discussed.   Primary osteoarthritis of both knees - Moderate osteoarthritis and moderate chondromalacia patella: Her knee joint pain is less severe than at the last visit.  She denies any joint swelling. She has been walking for exercise.  She is having less discomfort when going down steps.   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 The patient was provided an opportunity to ask questions and all were answered. The patient agreed with the plan and demonstrated an understanding of the instructions.   The patient was advised to call back or seek an in-person evaluation if the symptoms worsen or if the condition fails to improve as anticipated.  I provided 15 minutes of non-face-to-face time during this encounter.   Bo Merino, MD   Scribed by-  Hazel Sams, PA-C

## 2018-11-21 DIAGNOSIS — F332 Major depressive disorder, recurrent severe without psychotic features: Secondary | ICD-10-CM | POA: Diagnosis not present

## 2018-11-21 DIAGNOSIS — G4700 Insomnia, unspecified: Secondary | ICD-10-CM | POA: Diagnosis not present

## 2018-11-21 DIAGNOSIS — F061 Catatonic disorder due to known physiological condition: Secondary | ICD-10-CM | POA: Diagnosis not present

## 2018-11-25 ENCOUNTER — Other Ambulatory Visit: Payer: Self-pay

## 2018-11-25 ENCOUNTER — Telehealth (INDEPENDENT_AMBULATORY_CARE_PROVIDER_SITE_OTHER): Payer: PPO | Admitting: Rheumatology

## 2018-11-25 ENCOUNTER — Encounter: Payer: Self-pay | Admitting: Rheumatology

## 2018-11-25 DIAGNOSIS — M19041 Primary osteoarthritis, right hand: Secondary | ICD-10-CM

## 2018-11-25 DIAGNOSIS — Z79899 Other long term (current) drug therapy: Secondary | ICD-10-CM | POA: Diagnosis not present

## 2018-11-25 DIAGNOSIS — L719 Rosacea, unspecified: Secondary | ICD-10-CM | POA: Diagnosis not present

## 2018-11-25 DIAGNOSIS — M3500 Sicca syndrome, unspecified: Secondary | ICD-10-CM | POA: Diagnosis not present

## 2018-11-25 DIAGNOSIS — Z8639 Personal history of other endocrine, nutritional and metabolic disease: Secondary | ICD-10-CM | POA: Diagnosis not present

## 2018-11-25 DIAGNOSIS — M19042 Primary osteoarthritis, left hand: Secondary | ICD-10-CM | POA: Diagnosis not present

## 2018-11-25 DIAGNOSIS — M170 Bilateral primary osteoarthritis of knee: Secondary | ICD-10-CM

## 2018-11-25 DIAGNOSIS — F322 Major depressive disorder, single episode, severe without psychotic features: Secondary | ICD-10-CM

## 2018-11-25 DIAGNOSIS — Z8619 Personal history of other infectious and parasitic diseases: Secondary | ICD-10-CM

## 2018-11-25 DIAGNOSIS — Z8669 Personal history of other diseases of the nervous system and sense organs: Secondary | ICD-10-CM

## 2018-11-25 DIAGNOSIS — M359 Systemic involvement of connective tissue, unspecified: Secondary | ICD-10-CM | POA: Diagnosis not present

## 2018-12-03 ENCOUNTER — Other Ambulatory Visit: Payer: Self-pay | Admitting: *Deleted

## 2018-12-03 DIAGNOSIS — Z79899 Other long term (current) drug therapy: Secondary | ICD-10-CM | POA: Diagnosis not present

## 2018-12-03 DIAGNOSIS — M359 Systemic involvement of connective tissue, unspecified: Secondary | ICD-10-CM

## 2018-12-05 LAB — CBC WITH DIFFERENTIAL/PLATELET
Absolute Monocytes: 848 cells/uL (ref 200–950)
Basophils Absolute: 67 cells/uL (ref 0–200)
Basophils Relative: 0.8 %
Eosinophils Absolute: 269 cells/uL (ref 15–500)
Eosinophils Relative: 3.2 %
HCT: 41.7 % (ref 35.0–45.0)
Hemoglobin: 14.5 g/dL (ref 11.7–15.5)
Lymphs Abs: 1436 cells/uL (ref 850–3900)
MCH: 31.3 pg (ref 27.0–33.0)
MCHC: 34.8 g/dL (ref 32.0–36.0)
MCV: 90.1 fL (ref 80.0–100.0)
MPV: 10.5 fL (ref 7.5–12.5)
Monocytes Relative: 10.1 %
Neutro Abs: 5779 cells/uL (ref 1500–7800)
Neutrophils Relative %: 68.8 %
Platelets: 279 10*3/uL (ref 140–400)
RBC: 4.63 10*6/uL (ref 3.80–5.10)
RDW: 12.5 % (ref 11.0–15.0)
Total Lymphocyte: 17.1 %
WBC: 8.4 10*3/uL (ref 3.8–10.8)

## 2018-12-05 LAB — ANTI-DNA ANTIBODY, DOUBLE-STRANDED: ds DNA Ab: 2 IU/mL

## 2018-12-05 LAB — URINALYSIS, ROUTINE W REFLEX MICROSCOPIC
Bacteria, UA: NONE SEEN /HPF
Bilirubin Urine: NEGATIVE
Glucose, UA: NEGATIVE
Hgb urine dipstick: NEGATIVE
Hyaline Cast: NONE SEEN /LPF
Ketones, ur: NEGATIVE
Nitrite: NEGATIVE
Protein, ur: NEGATIVE
RBC / HPF: NONE SEEN /HPF (ref 0–2)
Specific Gravity, Urine: 1.005 (ref 1.001–1.03)
Squamous Epithelial / HPF: NONE SEEN /HPF (ref <=5)
WBC, UA: NONE SEEN /HPF (ref 0–5)
pH: 7 (ref 5.0–8.0)

## 2018-12-05 LAB — COMPLETE METABOLIC PANEL WITH GFR
AG Ratio: 1.9 (calc) (ref 1.0–2.5)
ALT: 32 U/L — ABNORMAL HIGH (ref 6–29)
AST: 20 U/L (ref 10–35)
Albumin: 4.2 g/dL (ref 3.6–5.1)
Alkaline phosphatase (APISO): 68 U/L (ref 37–153)
BUN: 14 mg/dL (ref 7–25)
CO2: 25 mmol/L (ref 20–32)
Calcium: 9.9 mg/dL (ref 8.6–10.4)
Chloride: 105 mmol/L (ref 98–110)
Creat: 0.77 mg/dL (ref 0.50–0.99)
GFR, Est African American: 96 mL/min/{1.73_m2} (ref >=60)
GFR, Est Non African American: 83 mL/min/{1.73_m2} (ref >=60)
Globulin: 2.2 g/dL (calc) (ref 1.9–3.7)
Glucose, Bld: 83 mg/dL (ref 65–99)
Potassium: 4.1 mmol/L (ref 3.5–5.3)
Sodium: 141 mmol/L (ref 135–146)
Total Bilirubin: 0.3 mg/dL (ref 0.2–1.2)
Total Protein: 6.4 g/dL (ref 6.1–8.1)

## 2018-12-05 LAB — CARDIOLIPIN ANTIBODIES, IGG, IGM, IGA
Anticardiolipin IgA: <11 [APL'U]
Anticardiolipin IgG: 31 [GPL'U] — ABNORMAL HIGH
Anticardiolipin IgM: 15 [MPL'U] — ABNORMAL HIGH

## 2018-12-05 LAB — ANTI-NUCLEAR AB-TITER (ANA TITER)
ANA TITER: 1:80 {titer} — ABNORMAL HIGH
ANA Titer 1: 1:80 {titer} — ABNORMAL HIGH

## 2018-12-05 LAB — BETA-2 GLYCOPROTEIN ANTIBODIES
Beta-2 Glyco 1 IgA: 14 SAU (ref <=20)
Beta-2 Glyco 1 IgM: 10 SMU (ref <=20)
Beta-2 Glyco I IgG: 14 SGU (ref <=20)

## 2018-12-05 LAB — ANA: Anti Nuclear Antibody (ANA): POSITIVE — AB

## 2018-12-05 LAB — SEDIMENTATION RATE: Sed Rate: 9 mm/h (ref 0–30)

## 2018-12-05 LAB — C3 AND C4
C3 Complement: 134 mg/dL (ref 83–193)
C4 Complement: 27 mg/dL (ref 15–57)

## 2018-12-05 NOTE — Progress Notes (Signed)
ESR and complements WNL.  DsDNA negative.  ANA positive, titer is stable.  CBC WNL.  ALT is borderline elevated. Rest of CMP WNL.  She is on statin therapy and tylenol is listed on her medication list. Please advise patient to avoid taking tylenol, NSAIDs, and alcohol use.   Beta-2 antibody negative.  Anticardiolipin antibodies stable.   UA revealed trace leukocytes, which may be a sign of a UTI.  Please advise patient to follow up with PCP if she is having symptoms of a UTI.

## 2018-12-16 ENCOUNTER — Other Ambulatory Visit: Payer: Self-pay | Admitting: Rheumatology

## 2018-12-16 DIAGNOSIS — M359 Systemic involvement of connective tissue, unspecified: Secondary | ICD-10-CM

## 2018-12-16 NOTE — Telephone Encounter (Signed)
Last Visit: 11/25/2018 telemedicine  Next Visit: 04/28/2019 Labs: 12/03/2018 CBC WNL. ALT is borderline elevated. Rest of CMP WNL.  Eye exam: 01/16/2018   Okay to refill per Dr. Estanislado Pandy.

## 2018-12-24 DIAGNOSIS — G4700 Insomnia, unspecified: Secondary | ICD-10-CM | POA: Diagnosis not present

## 2018-12-24 DIAGNOSIS — F332 Major depressive disorder, recurrent severe without psychotic features: Secondary | ICD-10-CM | POA: Diagnosis not present

## 2018-12-24 DIAGNOSIS — F061 Catatonic disorder due to known physiological condition: Secondary | ICD-10-CM | POA: Diagnosis not present

## 2019-01-14 DIAGNOSIS — E7800 Pure hypercholesterolemia, unspecified: Secondary | ICD-10-CM | POA: Diagnosis not present

## 2019-01-14 DIAGNOSIS — R635 Abnormal weight gain: Secondary | ICD-10-CM | POA: Diagnosis not present

## 2019-01-14 DIAGNOSIS — Z6830 Body mass index (BMI) 30.0-30.9, adult: Secondary | ICD-10-CM | POA: Diagnosis not present

## 2019-01-14 DIAGNOSIS — R945 Abnormal results of liver function studies: Secondary | ICD-10-CM | POA: Diagnosis not present

## 2019-01-26 DIAGNOSIS — B0239 Other herpes zoster eye disease: Secondary | ICD-10-CM | POA: Diagnosis not present

## 2019-01-26 DIAGNOSIS — Z8669 Personal history of other diseases of the nervous system and sense organs: Secondary | ICD-10-CM | POA: Diagnosis not present

## 2019-01-26 DIAGNOSIS — H1789 Other corneal scars and opacities: Secondary | ICD-10-CM | POA: Diagnosis not present

## 2019-01-26 DIAGNOSIS — H2513 Age-related nuclear cataract, bilateral: Secondary | ICD-10-CM | POA: Diagnosis not present

## 2019-01-26 DIAGNOSIS — H11823 Conjunctivochalasis, bilateral: Secondary | ICD-10-CM | POA: Diagnosis not present

## 2019-02-02 DIAGNOSIS — H40001 Preglaucoma, unspecified, right eye: Secondary | ICD-10-CM | POA: Diagnosis not present

## 2019-02-16 DIAGNOSIS — F061 Catatonic disorder due to known physiological condition: Secondary | ICD-10-CM | POA: Diagnosis not present

## 2019-02-16 DIAGNOSIS — F332 Major depressive disorder, recurrent severe without psychotic features: Secondary | ICD-10-CM | POA: Diagnosis not present

## 2019-02-16 DIAGNOSIS — G4700 Insomnia, unspecified: Secondary | ICD-10-CM | POA: Diagnosis not present

## 2019-02-20 DIAGNOSIS — H40001 Preglaucoma, unspecified, right eye: Secondary | ICD-10-CM | POA: Diagnosis not present

## 2019-03-02 ENCOUNTER — Ambulatory Visit (INDEPENDENT_AMBULATORY_CARE_PROVIDER_SITE_OTHER): Payer: PPO | Admitting: Psychology

## 2019-03-02 ENCOUNTER — Ambulatory Visit: Payer: PPO | Admitting: Psychology

## 2019-03-02 DIAGNOSIS — F332 Major depressive disorder, recurrent severe without psychotic features: Secondary | ICD-10-CM

## 2019-03-10 ENCOUNTER — Other Ambulatory Visit: Payer: Self-pay | Admitting: Rheumatology

## 2019-03-10 DIAGNOSIS — M359 Systemic involvement of connective tissue, unspecified: Secondary | ICD-10-CM

## 2019-03-10 NOTE — Telephone Encounter (Signed)
Last Visit: 11/25/2018 telemedicine  Next Visit: 04/28/2019 Labs: 12/03/2018 CBC WNL. ALT is borderline elevated. Rest of CMP WNL. Eye exam: 01/16/2018   Patient advised she is due to update her PLQ eye exam. Patient states she has an appointment on 04/09/19.   Okay to refill 30 day supply per Dr. Estanislado Pandy

## 2019-03-16 DIAGNOSIS — Z8669 Personal history of other diseases of the nervous system and sense organs: Secondary | ICD-10-CM | POA: Diagnosis not present

## 2019-03-16 DIAGNOSIS — H40003 Preglaucoma, unspecified, bilateral: Secondary | ICD-10-CM | POA: Diagnosis not present

## 2019-03-17 DIAGNOSIS — F332 Major depressive disorder, recurrent severe without psychotic features: Secondary | ICD-10-CM | POA: Diagnosis not present

## 2019-03-17 DIAGNOSIS — G4700 Insomnia, unspecified: Secondary | ICD-10-CM | POA: Diagnosis not present

## 2019-03-17 DIAGNOSIS — F061 Catatonic disorder due to known physiological condition: Secondary | ICD-10-CM | POA: Diagnosis not present

## 2019-03-18 ENCOUNTER — Ambulatory Visit (INDEPENDENT_AMBULATORY_CARE_PROVIDER_SITE_OTHER): Payer: PPO | Admitting: Psychology

## 2019-03-18 DIAGNOSIS — F332 Major depressive disorder, recurrent severe without psychotic features: Secondary | ICD-10-CM | POA: Diagnosis not present

## 2019-03-30 DIAGNOSIS — F061 Catatonic disorder due to known physiological condition: Secondary | ICD-10-CM | POA: Diagnosis not present

## 2019-03-30 DIAGNOSIS — F332 Major depressive disorder, recurrent severe without psychotic features: Secondary | ICD-10-CM | POA: Diagnosis not present

## 2019-03-30 DIAGNOSIS — G4700 Insomnia, unspecified: Secondary | ICD-10-CM | POA: Diagnosis not present

## 2019-04-07 ENCOUNTER — Other Ambulatory Visit: Payer: Self-pay | Admitting: Rheumatology

## 2019-04-07 ENCOUNTER — Telehealth: Payer: Self-pay | Admitting: Rheumatology

## 2019-04-07 DIAGNOSIS — M359 Systemic involvement of connective tissue, unspecified: Secondary | ICD-10-CM

## 2019-04-07 NOTE — Telephone Encounter (Signed)
Last Visit: 11/25/2018 telemedicine  Next Visit: 04/28/2019 Labs: 12/03/2018  ESR and complements WNL. DsDNA negative. ANA positive, titer is stable. CBC WNL. ALT is borderline elevated. Rest of CMP WNL. Eye exam: 01/16/2018   Patient is scheduled to update plaquenil eye exam on 04/09/2019 and will have results faxed to our office.   Okay to refill 30 day of Plaquenil?

## 2019-04-07 NOTE — Telephone Encounter (Signed)
Received refill request electronically. I have sent it to Dr. Estanislado Pandy for approval.

## 2019-04-07 NOTE — Telephone Encounter (Signed)
Patient left a voicemail requesting prescription refill of Plaquenil to be sent to [facility].  Patient states she is scheduled for her Plaquenil eye exam on Thursday, 04/09/19.  Patient states she will have the results faxed to the office.

## 2019-04-07 NOTE — Telephone Encounter (Signed)
ok 

## 2019-04-08 ENCOUNTER — Ambulatory Visit (INDEPENDENT_AMBULATORY_CARE_PROVIDER_SITE_OTHER): Payer: PPO | Admitting: Psychology

## 2019-04-08 DIAGNOSIS — F332 Major depressive disorder, recurrent severe without psychotic features: Secondary | ICD-10-CM

## 2019-04-09 DIAGNOSIS — Z79899 Other long term (current) drug therapy: Secondary | ICD-10-CM | POA: Diagnosis not present

## 2019-04-09 DIAGNOSIS — B0052 Herpesviral keratitis: Secondary | ICD-10-CM | POA: Diagnosis not present

## 2019-04-09 DIAGNOSIS — H5213 Myopia, bilateral: Secondary | ICD-10-CM | POA: Diagnosis not present

## 2019-04-09 DIAGNOSIS — H1789 Other corneal scars and opacities: Secondary | ICD-10-CM | POA: Diagnosis not present

## 2019-04-17 DIAGNOSIS — Z1231 Encounter for screening mammogram for malignant neoplasm of breast: Secondary | ICD-10-CM | POA: Diagnosis not present

## 2019-04-21 NOTE — Progress Notes (Signed)
Office Visit Note  Patient: Brianna Ross             Date of Birth: 22-Sep-1955           MRN: [id number]             PCP: Leeroy Cha, MD Referring: Leeroy Cha,* Visit Date: 04/28/2019 Occupation: @GUAROCC @  Subjective:  Osteoarthritis (Doing good, right hip pain)   History of Present Illness: Brianna Ross is a 64 y.o. female with history of autoimmune disease and osteoarthritis.  She states recently she has been having some discomfort in her right hip which she points to the right anterior superior iliac spine.  She states she has been sitting with her legs on the coffee table and crossing her right leg on her left leg.  She is wondering if that is contributing to her symptoms.  None of the other joints are painful.  There is no history of oral ulcers, nasal ulcers, malar rash, photosensitivity, Raynaud's phenomenon or joint swelling.  The discomfort of osteoarthritis is manageable.  She has been taking Plaquenil 1 tablet twice a day Monday to Friday.  Activities of Daily Living:  Patient reports morning stiffness for 1 minute.   Patient Denies nocturnal pain.  Difficulty dressing/grooming: Denies Difficulty climbing stairs: Denies Difficulty getting out of chair: Reports Difficulty using hands for taps, buttons, cutlery, and/or writing: Denies  Review of Systems  Constitutional: Negative for fatigue, night sweats, weight gain and weight loss.  HENT: Negative for mouth sores, trouble swallowing, trouble swallowing, mouth dryness and nose dryness.   Eyes: Negative for pain, redness, visual disturbance and dryness.  Respiratory: Negative for cough, shortness of breath and difficulty breathing.   Cardiovascular: Negative for chest pain, palpitations, hypertension, irregular heartbeat and swelling in legs/feet.  Gastrointestinal: Negative for blood in stool, constipation and diarrhea.  Endocrine: Negative for excessive thirst and increased urination.  Genitourinary:  Negative for difficulty urinating and vaginal dryness.  Musculoskeletal: Positive for arthralgias, joint pain and morning stiffness. Negative for joint swelling, myalgias, muscle weakness, muscle tenderness and myalgias.  Skin: Negative for color change, rash, hair loss, skin tightness, ulcers and sensitivity to sunlight.  Allergic/Immunologic: Negative for susceptible to infections.  Neurological: Negative for dizziness, numbness, memory loss, night sweats and weakness.  Hematological: Negative for bruising/bleeding tendency and swollen glands.  Psychiatric/Behavioral: Negative for depressed mood and sleep disturbance. The patient is not nervous/anxious.     PMFS History:  Patient Active Problem List   Diagnosis Date Noted  . 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 Complicated grief [id number]  . Primary osteoarthritis of both hands 02/27/2016  . High risk medication use 02/20/2016  . Autoimmune disease (HCC)positive ANA, hypocomplementemia, positive beta 2 and positive anticardiolipin antibodies.  02/20/2016  . Sicca syndrome, unspecified ([place]) 02/20/2016  . History of sleep apnea 02/20/2016  . 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 OSA on CPAP 08/11/2014  . Hearing loss 06/29/2014  . OSA treated with BiPAP 08/06/2013  . Hyperlipidemia 04/29/2013  . 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 Family history of heart disease 12/10/2012  . 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 Positive ANA (antinuclear antibody) 03/06/2012  . Elevated aldolase level 03/06/2012  . Atypical nevi 11/14/2011  . Menopause 11/13/2011  . Rosacea 11/13/2011    Past Medical History:  Diagnosis Date  . Abnormal stress electrocardiogram test   . Allergy   . ANA positive   . Atypical nevi   .  Depression   . Headache(784.0)   . Hives 2011   x5  . Hyperlipidemia   . Menopause   . OSA (obstructive sleep apnea)   .  Osteoarthritis   . Right wrist fracture   . Rosacea   . Shingles 03/2012  . Shortness of breath   . Sleep apnea   . Urticaria     Family History  Problem Relation Age of Onset  . Glaucoma Mother   . Hyperlipidemia Mother   . Depression Mother   . Osteoporosis Mother   . Heart disease Father   . Hyperlipidemia Father   . Hypertension Father   . Heart defect Sister   . Heart disease Sister 13       congenital heart defect,deceased  . Hyperlipidemia Brother   . Depression Brother   . High Cholesterol Brother   . Cancer Paternal Grandmother   . Stroke Paternal Grandfather   . Heart disease Paternal Grandfather    Past Surgical History:  Procedure Laterality Date  . ORIF WRIST FRACTURE    . thymic cyst     removal  . WISDOM TOOTH EXTRACTION     Social History   Social History Narrative   Patient works for [employer].(full-time_    Patient lives at home with her husband [name]).   Patient does not have any children.   Patient is right-handed.   Patient drinks one cup of coffee everyday and two cups of tea daily.   Immunization History  Administered Date(s) Administered  . Influenza Split 11/13/2011  . Influenza,inj,Quad PF,6+ Mos 11/20/2016  . Influenza-Unspecified 09/11/2013  . Pneumococcal Polysaccharide-23 11/12/2012     Objective: Vital Signs: BP 99/83 (BP Location: Left Arm, Patient Position: Sitting, Cuff Size: Normal)   Pulse (!) 112   Resp 16   Ht 5' 4.5" (1.638 m)   Wt 163 lb 6.4 oz (74.1 kg)   BMI 27.61 kg/m    Physical Exam Vitals and nursing note reviewed.  Constitutional:      Appearance: She is well-developed.  HENT:     Head: Normocephalic and atraumatic.  Eyes:     Conjunctiva/sclera: Conjunctivae normal.  Cardiovascular:     Rate and Rhythm: Normal rate and regular rhythm.     Heart sounds: Normal heart sounds.  Pulmonary:     Effort: Pulmonary effort is normal.     Breath sounds: Normal breath sounds.  Abdominal:      General: Bowel sounds are normal.     Palpations: Abdomen is soft.  Musculoskeletal:     Cervical back: Normal range of motion.  Lymphadenopathy:     Cervical: No cervical adenopathy.  Skin:    General: Skin is warm and dry.     Capillary Refill: Capillary refill takes less than 2 seconds.  Neurological:     Mental Status: She is alert and oriented to person, place, and time.  Psychiatric:        Behavior: Behavior normal.      Musculoskeletal Exam: C-spine thoracic and lumbar spine with good range of motion.  Shoulder joints, elbow joints, wrist joints, MCPs PIPs and DIPs with good range of motion with no synovitis.  She had good range of motion in her bilateral hip joints, knee joints, ankles, MTPs and PIPs.  She has some tenderness over anterior superior iliac spine.  CDAI Exam: CDAI Score: -- Patient Global: --; Provider Global: -- Swollen: --; Tender: -- Joint Exam 04/28/2019   No joint exam has been documented  for this visit   There is currently no information documented on the homunculus. Go to the Rheumatology activity and complete the homunculus joint exam.  Investigation: No additional findings.  Imaging: No results found.  Recent Labs: Lab Results  Component Value Date   WBC 8.4 12/03/2018   HGB 14.5 12/03/2018   PLT 279 12/03/2018   NA 141 12/03/2018   K 4.1 12/03/2018   CL 105 12/03/2018   CO2 25 12/03/2018   GLUCOSE 83 12/03/2018   BUN 14 12/03/2018   CREATININE 0.77 12/03/2018   BILITOT 0.3 12/03/2018   ALKPHOS 48 04/25/2016   AST 20 12/03/2018   ALT 32 (H) 12/03/2018   PROT 6.4 12/03/2018   ALBUMIN 4.9 04/25/2016   CALCIUM 9.9 12/03/2018   GFRAA 96 12/03/2018    Speciality Comments: PLQ Eye Exam: 04/09/19 @ [facility] Follow up in 1 year  Procedures:  No procedures performed Allergies: Patient has no known allergies.   Assessment / Plan:     Visit Diagnoses: Autoimmune disease (HCC)positive ANA, hypocomplementemia,  positive beta 2 and positive anticardiolipin antibodies.  -Patient is clinically doing well.  She has no clinical features of autoimmune disease at this time.  We discussed decreasing Plaquenil to 1 tablet p.o. daily.  She was in agreement.  She will come in for lab work in a month.  Plan: Urinalysis, Routine w reflex microscopic, Anti-DNA antibody, double-stranded, C3 and C4, Sedimentation rate, Beta-2 glycoprotein antibodies, Cardiolipin antibodies, IgG, IgM, IgA  High risk medication use -she will decrease Plaquenil 200 mg, 1 tablet p.o. daily..  Last Plaquenil eye exam normal on April 09, 2019.- Plan: CBC with Differential/Platelet, COMPLETE METABOLIC PANEL WITH GFR  Sicca syndrome (HCC)-manageable with over-the-counter medications.  Pain in right hip-she has been having discomfort over the right anterior superior iliac spine.  I believe it is positional.  Have given her some stretching exercises.  If her symptoms persist she should notify us.  She had very good range of motion in her right hip without discomfort.  Primary osteoarthritis of both hands-she has mild osteoarthritis which is not causing much discomfort.  Primary osteoarthritis of both knees-she has some knee joint stiffness.  Osteopenia of multiple sites - Plan: VITAMIN D 25 Hydroxy (Vit-D Deficiency, Fractures) DEXA scan done on September 11, 2017 showed a T score of -2.3 in the right femoral neck.  We will repeat bone density in September 2021.  Use of calcium, vitamin D and resistive exercises was discussed.  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 CBC with Differential/Platelet  . COMPLETE METABOLIC PANEL WITH GFR  . Urinalysis, Routine w reflex microscopic  . Anti-DNA antibody, double-stranded  . C3 and C4  . Sedimentation rate  . VITAMIN  D 25 Hydroxy (Vit-D Deficiency, Fractures)  . Beta-2 glycoprotein antibodies  . Cardiolipin antibodies, IgG, IgM, IgA   No orders of the defined types were placed in this encounter.     Follow-Up Instructions: Return in about 5 months (around 09/28/2019) for Autoimmune disease.   Bo Merino, MD  Note - This record has been created using Editor, commissioning.  Chart creation errors have been sought, but may not always  have been located. Such creation errors do not reflect on  the standard of medical care.

## 2019-04-27 ENCOUNTER — Ambulatory Visit (INDEPENDENT_AMBULATORY_CARE_PROVIDER_SITE_OTHER): Payer: PPO | Admitting: Psychology

## 2019-04-27 DIAGNOSIS — F332 Major depressive disorder, recurrent severe without psychotic features: Secondary | ICD-10-CM | POA: Diagnosis not present

## 2019-04-28 ENCOUNTER — Encounter: Payer: Self-pay | Admitting: Rheumatology

## 2019-04-28 ENCOUNTER — Other Ambulatory Visit: Payer: Self-pay

## 2019-04-28 ENCOUNTER — Ambulatory Visit: Payer: PPO | Admitting: Rheumatology

## 2019-04-28 VITALS — BP 99/83 | HR 112 | Resp 16 | Ht 64.5 in | Wt 163.4 lb

## 2019-04-28 DIAGNOSIS — F061 Catatonic disorder due to known physiological condition: Secondary | ICD-10-CM | POA: Diagnosis not present

## 2019-04-28 DIAGNOSIS — Z8619 Personal history of other infectious and parasitic diseases: Secondary | ICD-10-CM

## 2019-04-28 DIAGNOSIS — Z8669 Personal history of other diseases of the nervous system and sense organs: Secondary | ICD-10-CM | POA: Diagnosis not present

## 2019-04-28 DIAGNOSIS — F329 Major depressive disorder, single episode, unspecified: Secondary | ICD-10-CM | POA: Diagnosis not present

## 2019-04-28 DIAGNOSIS — M170 Bilateral primary osteoarthritis of knee: Secondary | ICD-10-CM

## 2019-04-28 DIAGNOSIS — Z8639 Personal history of other endocrine, nutritional and metabolic disease: Secondary | ICD-10-CM

## 2019-04-28 DIAGNOSIS — Z79899 Other long term (current) drug therapy: Secondary | ICD-10-CM

## 2019-04-28 DIAGNOSIS — L719 Rosacea, unspecified: Secondary | ICD-10-CM

## 2019-04-28 DIAGNOSIS — M19041 Primary osteoarthritis, right hand: Secondary | ICD-10-CM | POA: Diagnosis not present

## 2019-04-28 DIAGNOSIS — M25551 Pain in right hip: Secondary | ICD-10-CM

## 2019-04-28 DIAGNOSIS — F322 Major depressive disorder, single episode, severe without psychotic features: Secondary | ICD-10-CM | POA: Diagnosis not present

## 2019-04-28 DIAGNOSIS — E7800 Pure hypercholesterolemia, unspecified: Secondary | ICD-10-CM | POA: Diagnosis not present

## 2019-04-28 DIAGNOSIS — M19042 Primary osteoarthritis, left hand: Secondary | ICD-10-CM

## 2019-04-28 DIAGNOSIS — M359 Systemic involvement of connective tissue, unspecified: Secondary | ICD-10-CM

## 2019-04-28 DIAGNOSIS — M8589 Other specified disorders of bone density and structure, multiple sites: Secondary | ICD-10-CM | POA: Diagnosis not present

## 2019-04-28 DIAGNOSIS — M3500 Sicca syndrome, unspecified: Secondary | ICD-10-CM | POA: Diagnosis not present

## 2019-04-28 DIAGNOSIS — E785 Hyperlipidemia, unspecified: Secondary | ICD-10-CM | POA: Diagnosis not present

## 2019-04-28 NOTE — Patient Instructions (Addendum)
Return in 1 month for your labs.  Change hydroxychloroquine 200 mg tablet, 1 tablet by mouth every day   Hip Exercises Ask your health care provider which exercises are safe for you. Do exercises exactly as told by your health care provider and adjust them as directed. It is normal to feel mild stretching, pulling, tightness, or discomfort as you do these exercises. Stop right away if you feel sudden pain or your pain gets worse. Do not begin these exercises until told by your health care provider. Stretching and range-of-motion exercises These exercises warm up your muscles and joints and improve the movement and flexibility of your hip. These exercises also help to relieve pain, numbness, and tingling. You may be asked to limit your range of motion if you had a hip replacement. Talk to your health care provider about these restrictions. Hamstrings, supine  1. Lie on your back (supine position). 2. Loop a belt or towel over the ball of your left / right foot. The ball of your foot is on the walking surface, right under your toes. 3. Straighten your left / right knee and slowly pull on the belt or towel to raise your leg until you feel a gentle stretch behind your knee (hamstring). ? Do not let your knee bend while you do this. ? Keep your other leg flat on the floor. 4. Hold this position for __________ seconds. 5. Slowly return your leg to the starting position. Repeat __________ times. Complete this exercise __________ times a day. Hip rotation  1. Lie on your back on a firm surface. 2. With your left / right hand, gently pull your left / right knee toward the shoulder that is on the same side of the body. Stop when your knee is pointing toward the ceiling. 3. Hold your left / right ankle with your other hand. 4. Keeping your knee steady, gently pull your left / right ankle toward your other shoulder until you feel a stretch in your buttocks. ? Keep your hips and shoulders firmly planted  while you do this stretch. 5. Hold this position for __________ seconds. Repeat __________ times. Complete this exercise __________ times a day. Seated stretch This exercise is sometimes called hamstrings and adductors stretch. 1. Sit on the floor with your legs stretched wide. Keep your knees straight during this exercise. 2. Keeping your head and back in a straight line, bend at your waist to reach for your left foot (position A). You should feel a stretch in your right inner thigh (adductors). 3. Hold this position for __________ seconds. Then slowly return to the upright position. 4. Keeping your head and back in a straight line, bend at your waist to reach forward (position B). You should feel a stretch behind both of your thighs and knees (hamstrings). 5. Hold this position for __________ seconds. Then slowly return to the upright position. 6. Keeping your head and back in a straight line, bend at your waist to reach for your right foot (position C). You should feel a stretch in your left inner thigh (adductors). 7. Hold this position for __________ seconds. Then slowly return to the upright position. Repeat __________ times. Complete this exercise __________ times a day. Lunge This exercise stretches the muscles of the hip (hip flexors). 1. Place your left / right knee on the floor and bend your other knee so that is directly over your ankle. You should be half-kneeling. 2. Keep good posture with your head over your shoulders. 3. Tighten  your buttocks to point your tailbone downward. This will prevent your back from arching too much. 4. You should feel a gentle stretch in the front of your left / right thigh and hip. If you do not feel a stretch, slide your other foot forward slightly and then slowly lunge forward with your chest up until your knee once again lines up over your ankle. ? Make sure your tailbone continues to point downward. 5. Hold this position for __________  seconds. 6. Slowly return to the starting position. Repeat __________ times. Complete this exercise __________ times a day. Strengthening exercises These exercises build strength and endurance in your hip. Endurance is the ability to use your muscles for a long time, even after they get tired. Bridge This exercise strengthens the muscles of your hip (hip extensors). 1. Lie on your back on a firm surface with your knees bent and your feet flat on the floor. 2. Tighten your buttocks muscles and lift your bottom off the floor until the trunk of your body and your hips are level with your thighs. ? Do not arch your back. ? You should feel the muscles working in your buttocks and the back of your thighs. If you do not feel these muscles, slide your feet 1-2 inches (2.5-5 cm) farther away from your buttocks. 3. Hold this position for __________ seconds. 4. Slowly lower your hips to the starting position. 5. Let your muscles relax completely between repetitions. Repeat __________ times. Complete this exercise __________ times a day. Straight leg raises, side-lying This exercise strengthens the muscles that move the hip joint away from the center of the body (hip abductors). 1. Lie on your side with your left / right leg in the top position. Lie so your head, shoulder, hip, and knee line up. You may bend your bottom knee slightly to help you balance. 2. Roll your hips slightly forward, so your hips are stacked directly over each other and your left / right knee is facing forward. 3. Leading with your heel, lift your top leg 4-6 inches (10-15 cm). You should feel the muscles in your top hip lifting. ? Do not let your foot drift forward. ? Do not let your knee roll toward the ceiling. 4. Hold this position for __________ seconds. 5. Slowly return to the starting position. 6. Let your muscles relax completely between repetitions. Repeat __________ times. Complete this exercise __________ times a  day. Straight leg raises, side-lying This exercise strengthens the muscles that move the hip joint toward the center of the body (hip adductors). 1. Lie on your side with your left / right leg in the bottom position. Lie so your head, shoulder, hip, and knee line up. You may place your upper foot in front to help you balance. 2. Roll your hips slightly forward, so your hips are stacked directly over each other and your left / right knee is facing forward. 3. Tense the muscles in your inner thigh and lift your bottom leg 4-6 inches (10-15 cm). 4. Hold this position for __________ seconds. 5. Slowly return to the starting position. 6. Let your muscles relax completely between repetitions. Repeat __________ times. Complete this exercise __________ times a day. Straight leg raises, supine This exercise strengthens the muscles in the front of your thigh (quadriceps). 1. Lie on your back (supine position) with your left / right leg extended and your other knee bent. 2. Tense the muscles in the front of your left / right thigh. You should see  your kneecap slide up or see increased dimpling just above your knee. 3. Keep these muscles tight as you raise your leg 4-6 inches (10-15 cm) off the floor. Do not let your knee bend. 4. Hold this position for __________ seconds. 5. Keep these muscles tense as you lower your leg. 6. Relax the muscles slowly and completely between repetitions. Repeat __________ times. Complete this exercise __________ times a day. Hip abductors, standing This exercise strengthens the muscles that move the leg and hip joint away from the center of the body (hip abductors). 1. Tie one end of a rubber exercise band or tubing to a secure surface, such as a chair, table, or pole. 2. Loop the other end of the band or tubing around your left / right ankle. 3. Keeping your ankle with the band or tubing directly opposite the secured end, step away until there is tension in the tubing or  band. Hold on to a chair, table, or pole as needed for balance. 4. Lift your left / right leg out to your side. While you do this: ? Keep your back upright. ? Keep your shoulders over your hips. ? Keep your toes pointing forward. ? Make sure to use your hip muscles to slowly lift your leg. Do not tip your body or forcefully lift your leg. 5. Hold this position for __________ seconds. 6. Slowly return to the starting position. Repeat __________ times. Complete this exercise __________ times a day. Squats This exercise strengthens the muscles in the front of your thigh (quadriceps). 1. Stand in a door frame so your feet and knees are in line with the frame. You may place your hands on the frame for balance. 2. Slowly bend your knees and lower your hips like you are going to sit in a chair. ? Keep your lower legs in a straight-up-and-down position. ? Do not let your hips go lower than your knees. ? Do not bend your knees lower than told by your health care provider. ? If your hip pain increases, do not bend as low. 3. Hold this position for ___________ seconds. 4. Slowly push with your legs to return to standing. Do not use your hands to pull yourself to standing. Repeat __________ times. Complete this exercise __________ times a day. This information is not intended to replace advice given to you by your health care provider. Make sure you discuss any questions you have with your health care provider. Document Revised: 07/24/2018 Document Reviewed: 10/29/2017 Elsevier Patient Education  [place].

## 2019-05-02 DIAGNOSIS — F061 Catatonic disorder due to known physiological condition: Secondary | ICD-10-CM | POA: Diagnosis not present

## 2019-05-02 DIAGNOSIS — F332 Major depressive disorder, recurrent severe without psychotic features: Secondary | ICD-10-CM | POA: Diagnosis not present

## 2019-05-02 DIAGNOSIS — G4700 Insomnia, unspecified: Secondary | ICD-10-CM | POA: Diagnosis not present

## 2019-05-07 ENCOUNTER — Other Ambulatory Visit: Payer: Self-pay | Admitting: Rheumatology

## 2019-05-07 DIAGNOSIS — M359 Systemic involvement of connective tissue, unspecified: Secondary | ICD-10-CM

## 2019-05-07 NOTE — Telephone Encounter (Addendum)
Last Visit: 04/28/2019 Next Visit: 09/29/2019 Labs: 12/03/2018 CBC WNL. ALT is borderline elevated. Rest of CMP WNL.  Eye exam: 04/09/19   Per office note on 04/28/2019: she will decrease Plaquenil 200 mg, 1 tablet p.o. daily   Okay to refill per Dr. Estanislado Pandy

## 2019-05-25 ENCOUNTER — Other Ambulatory Visit: Payer: Self-pay

## 2019-05-25 DIAGNOSIS — M8589 Other specified disorders of bone density and structure, multiple sites: Secondary | ICD-10-CM | POA: Diagnosis not present

## 2019-05-25 DIAGNOSIS — M359 Systemic involvement of connective tissue, unspecified: Secondary | ICD-10-CM | POA: Diagnosis not present

## 2019-05-25 DIAGNOSIS — Z79899 Other long term (current) drug therapy: Secondary | ICD-10-CM

## 2019-05-27 ENCOUNTER — Ambulatory Visit (INDEPENDENT_AMBULATORY_CARE_PROVIDER_SITE_OTHER): Payer: PPO | Admitting: Psychology

## 2019-05-27 DIAGNOSIS — F332 Major depressive disorder, recurrent severe without psychotic features: Secondary | ICD-10-CM | POA: Diagnosis not present

## 2019-05-28 LAB — COMPLETE METABOLIC PANEL WITH GFR
AG Ratio: 1.9 (calc) (ref 1.0–2.5)
ALT: 18 U/L (ref 6–29)
AST: 17 U/L (ref 10–35)
Albumin: 4.4 g/dL (ref 3.6–5.1)
Alkaline phosphatase (APISO): 64 U/L (ref 37–153)
BUN: 8 mg/dL (ref 7–25)
CO2: 28 mmol/L (ref 20–32)
Calcium: 10 mg/dL (ref 8.6–10.4)
Chloride: 104 mmol/L (ref 98–110)
Creat: 0.82 mg/dL (ref 0.50–0.99)
GFR, Est African American: 89 mL/min/{1.73_m2} (ref >=60)
GFR, Est Non African American: 77 mL/min/{1.73_m2} (ref >=60)
Globulin: 2.3 g/dL (calc) (ref 1.9–3.7)
Glucose, Bld: 89 mg/dL (ref 65–99)
Potassium: 3.6 mmol/L (ref 3.5–5.3)
Sodium: 141 mmol/L (ref 135–146)
Total Bilirubin: 0.4 mg/dL (ref 0.2–1.2)
Total Protein: 6.7 g/dL (ref 6.1–8.1)

## 2019-05-28 LAB — URINALYSIS, ROUTINE W REFLEX MICROSCOPIC
Bilirubin Urine: NEGATIVE
Glucose, UA: NEGATIVE
Hgb urine dipstick: NEGATIVE
Ketones, ur: NEGATIVE
Leukocytes,Ua: NEGATIVE
Nitrite: NEGATIVE
Protein, ur: NEGATIVE
Specific Gravity, Urine: 1.006 (ref 1.001–1.03)
pH: 7.5 (ref 5.0–8.0)

## 2019-05-28 LAB — CBC WITH DIFFERENTIAL/PLATELET
Absolute Monocytes: 706 cells/uL (ref 200–950)
Basophils Absolute: 59 cells/uL (ref 0–200)
Basophils Relative: 0.9 %
Eosinophils Absolute: 191 cells/uL (ref 15–500)
Eosinophils Relative: 2.9 %
HCT: 42.2 % (ref 35.0–45.0)
Hemoglobin: 14.3 g/dL (ref 11.7–15.5)
Lymphs Abs: 1485 cells/uL (ref 850–3900)
MCH: 30.1 pg (ref 27.0–33.0)
MCHC: 33.9 g/dL (ref 32.0–36.0)
MCV: 88.8 fL (ref 80.0–100.0)
MPV: 10.5 fL (ref 7.5–12.5)
Monocytes Relative: 10.7 %
Neutro Abs: 4158 cells/uL (ref 1500–7800)
Neutrophils Relative %: 63 %
Platelets: 258 10*3/uL (ref 140–400)
RBC: 4.75 10*6/uL (ref 3.80–5.10)
RDW: 13 % (ref 11.0–15.0)
Total Lymphocyte: 22.5 %
WBC: 6.6 10*3/uL (ref 3.8–10.8)

## 2019-05-28 LAB — C3 AND C4
C3 Complement: 142 mg/dL (ref 83–193)
C4 Complement: 33 mg/dL (ref 15–57)

## 2019-05-28 LAB — CARDIOLIPIN ANTIBODIES, IGG, IGM, IGA
Anticardiolipin IgA: <11 [APL'U]
Anticardiolipin IgG: 24 [GPL'U] — ABNORMAL HIGH
Anticardiolipin IgM: <12 [MPL'U]

## 2019-05-28 LAB — BETA-2 GLYCOPROTEIN ANTIBODIES
Beta-2 Glyco 1 IgA: 12 SAU (ref <=20)
Beta-2 Glyco 1 IgM: 11 SMU (ref <=20)
Beta-2 Glyco I IgG: <9 SGU (ref <=20)

## 2019-05-28 LAB — VITAMIN D 25 HYDROXY (VIT D DEFICIENCY, FRACTURES): Vit D, 25-Hydroxy: 57 ng/mL (ref 30–100)

## 2019-05-28 LAB — ANTI-DNA ANTIBODY, DOUBLE-STRANDED: ds DNA Ab: 2 IU/mL

## 2019-05-28 LAB — SEDIMENTATION RATE: Sed Rate: 2 mm/h (ref 0–30)

## 2019-05-28 NOTE — Progress Notes (Signed)
Labs are stable.  Cardiolipin antibodies are lower than before.

## 2019-05-31 DIAGNOSIS — F061 Catatonic disorder due to known physiological condition: Secondary | ICD-10-CM | POA: Diagnosis not present

## 2019-05-31 DIAGNOSIS — G4700 Insomnia, unspecified: Secondary | ICD-10-CM | POA: Diagnosis not present

## 2019-05-31 DIAGNOSIS — F332 Major depressive disorder, recurrent severe without psychotic features: Secondary | ICD-10-CM | POA: Diagnosis not present

## 2019-06-03 DIAGNOSIS — G4733 Obstructive sleep apnea (adult) (pediatric): Secondary | ICD-10-CM | POA: Diagnosis not present

## 2019-06-16 DIAGNOSIS — E785 Hyperlipidemia, unspecified: Secondary | ICD-10-CM | POA: Diagnosis not present

## 2019-06-16 DIAGNOSIS — F3341 Major depressive disorder, recurrent, in partial remission: Secondary | ICD-10-CM | POA: Diagnosis not present

## 2019-06-16 DIAGNOSIS — E7800 Pure hypercholesterolemia, unspecified: Secondary | ICD-10-CM | POA: Diagnosis not present

## 2019-06-16 DIAGNOSIS — L719 Rosacea, unspecified: Secondary | ICD-10-CM | POA: Diagnosis not present

## 2019-06-16 DIAGNOSIS — M85859 Other specified disorders of bone density and structure, unspecified thigh: Secondary | ICD-10-CM | POA: Diagnosis not present

## 2019-06-16 DIAGNOSIS — Z1231 Encounter for screening mammogram for malignant neoplasm of breast: Secondary | ICD-10-CM | POA: Diagnosis not present

## 2019-06-16 DIAGNOSIS — E559 Vitamin D deficiency, unspecified: Secondary | ICD-10-CM | POA: Diagnosis not present

## 2019-06-16 DIAGNOSIS — R768 Other specified abnormal immunological findings in serum: Secondary | ICD-10-CM | POA: Diagnosis not present

## 2019-06-16 DIAGNOSIS — Z0000 Encounter for general adult medical examination without abnormal findings: Secondary | ICD-10-CM | POA: Diagnosis not present

## 2019-06-18 DIAGNOSIS — Z85828 Personal history of other malignant neoplasm of skin: Secondary | ICD-10-CM | POA: Diagnosis not present

## 2019-06-18 DIAGNOSIS — Z86018 Personal history of other benign neoplasm: Secondary | ICD-10-CM | POA: Diagnosis not present

## 2019-06-18 DIAGNOSIS — D225 Melanocytic nevi of trunk: Secondary | ICD-10-CM | POA: Diagnosis not present

## 2019-06-18 DIAGNOSIS — L738 Other specified follicular disorders: Secondary | ICD-10-CM | POA: Diagnosis not present

## 2019-06-18 DIAGNOSIS — L821 Other seborrheic keratosis: Secondary | ICD-10-CM | POA: Diagnosis not present

## 2019-06-18 DIAGNOSIS — L719 Rosacea, unspecified: Secondary | ICD-10-CM | POA: Diagnosis not present

## 2019-06-18 DIAGNOSIS — D2271 Melanocytic nevi of right lower limb, including hip: Secondary | ICD-10-CM | POA: Diagnosis not present

## 2019-06-18 DIAGNOSIS — D2272 Melanocytic nevi of left lower limb, including hip: Secondary | ICD-10-CM | POA: Diagnosis not present

## 2019-06-22 ENCOUNTER — Ambulatory Visit (INDEPENDENT_AMBULATORY_CARE_PROVIDER_SITE_OTHER): Payer: PPO | Admitting: Psychology

## 2019-06-22 DIAGNOSIS — F332 Major depressive disorder, recurrent severe without psychotic features: Secondary | ICD-10-CM | POA: Diagnosis not present

## 2019-06-30 DIAGNOSIS — F061 Catatonic disorder due to known physiological condition: Secondary | ICD-10-CM | POA: Diagnosis not present

## 2019-06-30 DIAGNOSIS — G4700 Insomnia, unspecified: Secondary | ICD-10-CM | POA: Diagnosis not present

## 2019-06-30 DIAGNOSIS — F332 Major depressive disorder, recurrent severe without psychotic features: Secondary | ICD-10-CM | POA: Diagnosis not present

## 2019-07-02 DIAGNOSIS — R000 Tachycardia, unspecified: Secondary | ICD-10-CM | POA: Diagnosis not present

## 2019-07-08 ENCOUNTER — Ambulatory Visit (INDEPENDENT_AMBULATORY_CARE_PROVIDER_SITE_OTHER): Payer: PPO | Admitting: Psychology

## 2019-07-08 DIAGNOSIS — F332 Major depressive disorder, recurrent severe without psychotic features: Secondary | ICD-10-CM | POA: Diagnosis not present

## 2019-07-11 DIAGNOSIS — F061 Catatonic disorder due to known physiological condition: Secondary | ICD-10-CM | POA: Diagnosis not present

## 2019-07-11 DIAGNOSIS — F332 Major depressive disorder, recurrent severe without psychotic features: Secondary | ICD-10-CM | POA: Diagnosis not present

## 2019-07-11 DIAGNOSIS — G4700 Insomnia, unspecified: Secondary | ICD-10-CM | POA: Diagnosis not present

## 2019-07-20 ENCOUNTER — Ambulatory Visit (INDEPENDENT_AMBULATORY_CARE_PROVIDER_SITE_OTHER): Payer: PPO | Admitting: Psychology

## 2019-07-20 DIAGNOSIS — F332 Major depressive disorder, recurrent severe without psychotic features: Secondary | ICD-10-CM | POA: Diagnosis not present

## 2019-07-27 DIAGNOSIS — F061 Catatonic disorder due to known physiological condition: Secondary | ICD-10-CM | POA: Diagnosis not present

## 2019-07-27 DIAGNOSIS — G4700 Insomnia, unspecified: Secondary | ICD-10-CM | POA: Diagnosis not present

## 2019-07-27 DIAGNOSIS — F332 Major depressive disorder, recurrent severe without psychotic features: Secondary | ICD-10-CM | POA: Diagnosis not present

## 2019-07-29 DIAGNOSIS — E785 Hyperlipidemia, unspecified: Secondary | ICD-10-CM | POA: Diagnosis not present

## 2019-07-29 DIAGNOSIS — F061 Catatonic disorder due to known physiological condition: Secondary | ICD-10-CM | POA: Diagnosis not present

## 2019-07-29 DIAGNOSIS — F3341 Major depressive disorder, recurrent, in partial remission: Secondary | ICD-10-CM | POA: Diagnosis not present

## 2019-07-29 DIAGNOSIS — E7800 Pure hypercholesterolemia, unspecified: Secondary | ICD-10-CM | POA: Diagnosis not present

## 2019-07-29 DIAGNOSIS — F322 Major depressive disorder, single episode, severe without psychotic features: Secondary | ICD-10-CM | POA: Diagnosis not present

## 2019-07-29 DIAGNOSIS — F329 Major depressive disorder, single episode, unspecified: Secondary | ICD-10-CM | POA: Diagnosis not present

## 2019-08-03 DIAGNOSIS — F329 Major depressive disorder, single episode, unspecified: Secondary | ICD-10-CM | POA: Diagnosis not present

## 2019-08-03 DIAGNOSIS — F061 Catatonic disorder due to known physiological condition: Secondary | ICD-10-CM | POA: Diagnosis not present

## 2019-08-03 DIAGNOSIS — F322 Major depressive disorder, single episode, severe without psychotic features: Secondary | ICD-10-CM | POA: Diagnosis not present

## 2019-08-03 DIAGNOSIS — E7800 Pure hypercholesterolemia, unspecified: Secondary | ICD-10-CM | POA: Diagnosis not present

## 2019-08-03 DIAGNOSIS — E785 Hyperlipidemia, unspecified: Secondary | ICD-10-CM | POA: Diagnosis not present

## 2019-08-03 DIAGNOSIS — F3341 Major depressive disorder, recurrent, in partial remission: Secondary | ICD-10-CM | POA: Diagnosis not present

## 2019-08-06 ENCOUNTER — Other Ambulatory Visit: Payer: Self-pay | Admitting: *Deleted

## 2019-08-06 DIAGNOSIS — M359 Systemic involvement of connective tissue, unspecified: Secondary | ICD-10-CM

## 2019-08-06 MED ORDER — HYDROXYCHLOROQUINE SULFATE 200 MG PO TABS: 200.0000 mg | ORAL_TABLET | Freq: Every day | ORAL | 0 refills | Status: DC

## 2019-08-06 NOTE — Telephone Encounter (Signed)
Last Visit: 04/28/2019 Next Visit: 09/29/2019 Labs: 05/25/2019 stable Eye exam: 04/09/19   Per office note on 04/28/2019: she will decrease Plaquenil 200 mg, 1 tablet p.o. daily   Okay to refill PLQ?

## 2019-08-10 DIAGNOSIS — M8589 Other specified disorders of bone density and structure, multiple sites: Secondary | ICD-10-CM | POA: Diagnosis not present

## 2019-08-11 ENCOUNTER — Ambulatory Visit (INDEPENDENT_AMBULATORY_CARE_PROVIDER_SITE_OTHER): Payer: PPO | Admitting: Psychology

## 2019-08-11 DIAGNOSIS — G4700 Insomnia, unspecified: Secondary | ICD-10-CM | POA: Diagnosis not present

## 2019-08-11 DIAGNOSIS — F332 Major depressive disorder, recurrent severe without psychotic features: Secondary | ICD-10-CM

## 2019-08-11 DIAGNOSIS — F061 Catatonic disorder due to known physiological condition: Secondary | ICD-10-CM | POA: Diagnosis not present

## 2019-08-20 ENCOUNTER — Ambulatory Visit: Payer: PPO | Admitting: Adult Health

## 2019-09-03 ENCOUNTER — Ambulatory Visit: Payer: PPO | Admitting: Psychology

## 2019-09-14 DIAGNOSIS — F061 Catatonic disorder due to known physiological condition: Secondary | ICD-10-CM | POA: Diagnosis not present

## 2019-09-14 DIAGNOSIS — G4700 Insomnia, unspecified: Secondary | ICD-10-CM | POA: Diagnosis not present

## 2019-09-14 DIAGNOSIS — F332 Major depressive disorder, recurrent severe without psychotic features: Secondary | ICD-10-CM | POA: Diagnosis not present

## 2019-09-15 NOTE — Progress Notes (Signed)
Office Visit Note  Patient: Brianna Ross             Date of Birth: 09/23/1955           MRN: [id number]             PCP: Leeroy Cha, MD Referring: Leeroy Cha,* Visit Date: 09/29/2019 Occupation: @GUAROCC @  Subjective:  Medication Management   History of Present Illness: Brianna Ross is a 64 y.o. female with history of autoimmune disease and osteoarthritis.  She denies any history of oral ulcers, nasal ulcers, sicca symptoms, Raynaud's, inflammatory arthritis.  She denies any joint pain from osteoarthritis currently.  She has been taking Plaquenil 1 tablet a day which she has been tolerating well.  She states she recently had a DEXA scan by her PCP which showed osteopenia.  She has been taking calcium and vitamin D and doing resistive exercises.  Activities of Daily Living:  Patient reports morning stiffness for 0 minutes.   Patient Denies nocturnal pain.  Difficulty dressing/grooming: Denies Difficulty climbing stairs: Denies Difficulty getting out of chair: Denies Difficulty using hands for taps, buttons, cutlery, and/or writing: Denies  Review of Systems  Constitutional: Negative for fatigue.  HENT: Negative for mouth sores, mouth dryness and nose dryness.   Eyes: Negative for pain, itching and dryness.  Respiratory: Negative for shortness of breath, wheezing and difficulty breathing.   Cardiovascular: Negative for chest pain and palpitations.  Gastrointestinal: Negative for blood in stool, constipation and diarrhea.  Endocrine: Negative for increased urination.  Genitourinary: Negative for difficulty urinating.  Musculoskeletal: Negative for arthralgias, joint pain, joint swelling, myalgias, morning stiffness, muscle tenderness and myalgias.  Skin: Negative for color change, rash and redness.  Allergic/Immunologic: Negative for susceptible to infections.  Neurological: Negative for dizziness, numbness, headaches, memory loss and weakness.  Hematological:  Negative for bruising/bleeding tendency.  Psychiatric/Behavioral: Negative for confusion and sleep disturbance.    PMFS History:  Patient Active Problem List   Diagnosis Date Noted  . 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 Complicated grief 55/73/2202  . Primary osteoarthritis of both hands 02/27/2016  . High risk medication use 02/20/2016  . Autoimmune disease (HCC)positive ANA, hypocomplementemia, positive beta 2 and positive anticardiolipin antibodies.  02/20/2016  . Sicca syndrome, unspecified ([place]) 02/20/2016  . History of sleep apnea 02/20/2016  . 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 OSA on CPAP 08/11/2014  . Hearing loss 06/29/2014  . OSA treated with BiPAP 08/06/2013  . Hyperlipidemia 04/29/2013  . 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 Family history of heart disease 12/10/2012  . 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 Positive ANA (antinuclear antibody) 03/06/2012  . Elevated aldolase level 03/06/2012  . Atypical nevi 11/14/2011  . Menopause 11/13/2011  . Rosacea 11/13/2011    Past Medical History:  Diagnosis Date  . Abnormal stress electrocardiogram test   . Allergy   . ANA positive   . Atypical nevi   . Depression   . Headache(784.0)   . Hives 2011   x5  . Hyperlipidemia   . Menopause   . OSA (obstructive sleep apnea)   . Osteoarthritis   . Right wrist fracture   . Rosacea   . Shingles 03/2012  . Shortness of breath   . Sleep apnea   . Urticaria     Family History  Problem Relation Age of Onset  . Glaucoma Mother   . Hyperlipidemia Mother   . Depression Mother   .  Osteoporosis Mother   . Breast cancer Mother   . Heart disease Father   . Hyperlipidemia Father   . Hypertension Father   . Heart defect Sister   . Heart disease Sister 13       congenital heart defect,deceased  . Hyperlipidemia Brother   . Depression Brother   . High Cholesterol Brother    . Cancer Paternal Grandmother   . Stroke Paternal Grandfather   . Heart disease Paternal Grandfather    Past Surgical History:  Procedure Laterality Date  . ORIF WRIST FRACTURE    . thymic cyst     removal  . WISDOM TOOTH EXTRACTION     Social History   Social History Narrative   Patient works for [employer].(full-time_    Patient lives at home with her husband Brianna Ross).   Patient does not have any children.   Patient is right-handed.   Patient drinks one cup of coffee everyday and two cups of tea daily.   Immunization History  Administered Date(s) Administered  . Influenza Split 11/13/2011  . Influenza,inj,Quad PF,6+ Mos 11/20/2016  . Influenza-Unspecified 09/11/2013  . Moderna SARS-COVID-2 Vaccination 02/25/2019, 03/25/2019  . Pneumococcal Polysaccharide-23 11/12/2012     Objective: Vital Signs: BP 106/73 (BP Location: Left Arm, Patient Position: Sitting, Cuff Size: Normal)   Pulse 92   Resp 16   Ht 5' 4.5" (1.638 m)   Wt 156 lb 9.6 oz (71 kg)   BMI 26.47 kg/m    Physical Exam Vitals and nursing note reviewed.  Constitutional:      Appearance: She is well-developed.  HENT:     Head: Normocephalic and atraumatic.  Eyes:     Conjunctiva/sclera: Conjunctivae normal.  Cardiovascular:     Rate and Rhythm: Normal rate and regular rhythm.     Heart sounds: Normal heart sounds.  Pulmonary:     Effort: Pulmonary effort is normal.     Breath sounds: Normal breath sounds.  Abdominal:     General: Bowel sounds are normal.     Palpations: Abdomen is soft.  Musculoskeletal:     Cervical back: Normal range of motion.  Lymphadenopathy:     Cervical: No cervical adenopathy.  Skin:    General: Skin is warm and dry.     Capillary Refill: Capillary refill takes less than 2 seconds.  Neurological:     Mental Status: She is alert and oriented to person, place, and time.  Psychiatric:        Behavior: Behavior normal.      Musculoskeletal Exam: She  has limited range of motion of the cervical spine without discomfort.  Shoulder joints, elbow joints, wrist joints, MCPs PIPs and DIPs with good range of motion.  She has mild thickening of PIP and DIP joints.  Hip joints, knee joints, ankles, MTPs and PIPs with good range of motion with no synovitis  CDAI Exam: CDAI Score: -- Patient Global: --; Provider Global: -- Swollen: --; Tender: -- Joint Exam 09/29/2019   No joint exam has been documented for this visit   There is currently no information documented on the homunculus. Go to the Rheumatology activity and complete the homunculus joint exam.  Investigation: No additional findings.  Imaging: No results found.  Recent Labs: Lab Results  Component Value Date   WBC 6.6 05/25/2019   HGB 14.3 05/25/2019   PLT 258 05/25/2019   NA 141 05/25/2019   K 3.6 05/25/2019   CL 104 05/25/2019   CO2 28 05/25/2019  GLUCOSE 89 05/25/2019   BUN 8 05/25/2019   CREATININE 0.82 05/25/2019   BILITOT 0.4 05/25/2019   ALKPHOS 48 04/25/2016   AST 17 05/25/2019   ALT 18 05/25/2019   PROT 6.7 05/25/2019   ALBUMIN 4.9 04/25/2016   CALCIUM 10.0 05/25/2019   GFRAA 89 05/25/2019    Speciality Comments: PLQ Eye Exam: 04/09/19 @ [facility] Follow up in 1 year  Procedures:  No procedures performed Allergies: Patient has no known allergies.   Assessment / Plan:     Visit Diagnoses: Autoimmune disease (HCC)positive ANA, hypocomplementemia, positive beta 2 and positive anticardiolipin antibodies.  -She is clinically doing well without any increased joint pain or joint swelling.  She has been taking Plaquenil 1 tablet p.o. daily.  I discussed decreasing Plaquenil to 1 tablet every other day.  We will check labs again at the follow-up visit in 5 months.  Plan: Urinalysis, Routine w reflex microscopic, Anti-DNA antibody, double-stranded, C3 and C4, Sedimentation rate  High risk medication use - Plaquenil 200 mg, 1 tablet p.o. daily. PLQ Eye  Exam: 04/09/19 - Plan: CBC with Differential/Platelet, COMPLETE METABOLIC PANEL WITH GFR  Sicca syndrome (HCC)-she states that her sicca symptoms are manageable and not very symptomatic.  Primary osteoarthritis of both hands-without any discomfort.  Primary osteoarthritis of both knees-she is currently not having any discomfort.  Osteopenia of multiple sites - DEXA scan done on September 11, 2017 showed a T score of -2.3 in the right femoral neck.  Patient states that she recently had DEXA by her PCP which was consistent with osteopenia.  She was advised to continue to take calcium, vitamin D and resistive exercises.  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  Use of mask, social distancing and hand hygiene was discussed.  Possible use of monoclonal antibody infusion if she gets COVID-19 infection was also discussed.  Orders: Orders Placed This Encounter  Procedures  . CBC with Differential/Platelet  . COMPLETE METABOLIC PANEL WITH GFR  . Urinalysis, Routine w reflex microscopic  . Anti-DNA antibody, double-stranded  . C3 and C4  . Sedimentation rate   No orders of the defined types were placed in this encounter.    Follow-Up Instructions: No follow-ups on file.   Bo Merino, MD  Note - This record has been created using Editor, commissioning.  Chart creation errors have been sought, but may not always  have been located. Such creation errors do not reflect on  the standard of medical care.

## 2019-09-17 DIAGNOSIS — E785 Hyperlipidemia, unspecified: Secondary | ICD-10-CM | POA: Diagnosis not present

## 2019-09-17 DIAGNOSIS — F061 Catatonic disorder due to known physiological condition: Secondary | ICD-10-CM | POA: Diagnosis not present

## 2019-09-17 DIAGNOSIS — F329 Major depressive disorder, single episode, unspecified: Secondary | ICD-10-CM | POA: Diagnosis not present

## 2019-09-17 DIAGNOSIS — E7800 Pure hypercholesterolemia, unspecified: Secondary | ICD-10-CM | POA: Diagnosis not present

## 2019-09-17 DIAGNOSIS — F3341 Major depressive disorder, recurrent, in partial remission: Secondary | ICD-10-CM | POA: Diagnosis not present

## 2019-09-17 DIAGNOSIS — F322 Major depressive disorder, single episode, severe without psychotic features: Secondary | ICD-10-CM | POA: Diagnosis not present

## 2019-09-24 ENCOUNTER — Ambulatory Visit (INDEPENDENT_AMBULATORY_CARE_PROVIDER_SITE_OTHER): Payer: PPO | Admitting: Psychology

## 2019-09-24 DIAGNOSIS — F332 Major depressive disorder, recurrent severe without psychotic features: Secondary | ICD-10-CM

## 2019-09-29 ENCOUNTER — Encounter: Payer: Self-pay | Admitting: Rheumatology

## 2019-09-29 ENCOUNTER — Ambulatory Visit: Payer: PPO | Admitting: Rheumatology

## 2019-09-29 ENCOUNTER — Other Ambulatory Visit: Payer: Self-pay

## 2019-09-29 VITALS — BP 106/73 | HR 92 | Resp 16 | Ht 64.5 in | Wt 156.6 lb

## 2019-09-29 DIAGNOSIS — Z8669 Personal history of other diseases of the nervous system and sense organs: Secondary | ICD-10-CM | POA: Diagnosis not present

## 2019-09-29 DIAGNOSIS — Z79899 Other long term (current) drug therapy: Secondary | ICD-10-CM

## 2019-09-29 DIAGNOSIS — M19042 Primary osteoarthritis, left hand: Secondary | ICD-10-CM | POA: Diagnosis not present

## 2019-09-29 DIAGNOSIS — M8589 Other specified disorders of bone density and structure, multiple sites: Secondary | ICD-10-CM

## 2019-09-29 DIAGNOSIS — L719 Rosacea, unspecified: Secondary | ICD-10-CM | POA: Diagnosis not present

## 2019-09-29 DIAGNOSIS — M359 Systemic involvement of connective tissue, unspecified: Secondary | ICD-10-CM | POA: Diagnosis not present

## 2019-09-29 DIAGNOSIS — F322 Major depressive disorder, single episode, severe without psychotic features: Secondary | ICD-10-CM

## 2019-09-29 DIAGNOSIS — M19041 Primary osteoarthritis, right hand: Secondary | ICD-10-CM

## 2019-09-29 DIAGNOSIS — M170 Bilateral primary osteoarthritis of knee: Secondary | ICD-10-CM | POA: Diagnosis not present

## 2019-09-29 DIAGNOSIS — Z8639 Personal history of other endocrine, nutritional and metabolic disease: Secondary | ICD-10-CM | POA: Diagnosis not present

## 2019-09-29 DIAGNOSIS — Z8619 Personal history of other infectious and parasitic diseases: Secondary | ICD-10-CM

## 2019-09-29 DIAGNOSIS — Z7189 Other specified counseling: Secondary | ICD-10-CM | POA: Diagnosis not present

## 2019-09-29 DIAGNOSIS — M3500 Sicca syndrome, unspecified: Secondary | ICD-10-CM

## 2019-09-29 NOTE — Patient Instructions (Signed)
COVID-19 vaccine recommendations:   COVID-19 vaccine is recommended for everyone (unless you are allergic to a vaccine component), even if you are on a medication that suppresses your immune system.   Do not take Tylenol or any anti-inflammatory medications (NSAIDs) 24 hours prior to the COVID-19 vaccination.   There is no direct evidence about the efficacy of the COVID-19 vaccine in individuals who are on medications that suppress the immune system.   Even if you are fully vaccinated, and you are on any medications that suppress your immune system, please continue to wear a mask, maintain at least six feet social distance and practice hand hygiene.   If you develop a COVID-19 infection, please contact your PCP or our office to determine if you need antibody infusion.  The booster vaccine is now available for immunocompromised patients. It is advised that if you had Pfizer vaccine you should get Pfizer booster.  If you had a Moderna vaccine then you should get a Moderna booster. Johnson and Johnson does not have a booster vaccine at this time.  Please see the following web sites for updated information.   [email or web address]-Patient-Resources.[email or web address]  [email or web address]  

## 2019-09-30 LAB — COMPLETE METABOLIC PANEL WITH GFR
AG Ratio: 2.1 (calc) (ref 1.0–2.5)
ALT: 47 U/L — ABNORMAL HIGH (ref 6–29)
AST: 28 U/L (ref 10–35)
Albumin: 4.4 g/dL (ref 3.6–5.1)
Alkaline phosphatase (APISO): 85 U/L (ref 37–153)
BUN: 9 mg/dL (ref 7–25)
CO2: 31 mmol/L (ref 20–32)
Calcium: 10.1 mg/dL (ref 8.6–10.4)
Chloride: 105 mmol/L (ref 98–110)
Creat: 0.77 mg/dL (ref 0.50–0.99)
GFR, Est African American: 95 mL/min/{1.73_m2} (ref >=60)
GFR, Est Non African American: 82 mL/min/{1.73_m2} (ref >=60)
Globulin: 2.1 g/dL (calc) (ref 1.9–3.7)
Glucose, Bld: 81 mg/dL (ref 65–99)
Potassium: 4.3 mmol/L (ref 3.5–5.3)
Sodium: 142 mmol/L (ref 135–146)
Total Bilirubin: 0.4 mg/dL (ref 0.2–1.2)
Total Protein: 6.5 g/dL (ref 6.1–8.1)

## 2019-09-30 LAB — CBC WITH DIFFERENTIAL/PLATELET
Absolute Monocytes: 634 cells/uL (ref 200–950)
Basophils Absolute: 62 cells/uL (ref 0–200)
Basophils Relative: 1.2 %
Eosinophils Absolute: 343 cells/uL (ref 15–500)
Eosinophils Relative: 6.6 %
HCT: 41.9 % (ref 35.0–45.0)
Hemoglobin: 13.9 g/dL (ref 11.7–15.5)
Lymphs Abs: 1284 cells/uL (ref 850–3900)
MCH: 30.4 pg (ref 27.0–33.0)
MCHC: 33.2 g/dL (ref 32.0–36.0)
MCV: 91.7 fL (ref 80.0–100.0)
MPV: 10.1 fL (ref 7.5–12.5)
Monocytes Relative: 12.2 %
Neutro Abs: 2876 cells/uL (ref 1500–7800)
Neutrophils Relative %: 55.3 %
Platelets: 227 10*3/uL (ref 140–400)
RBC: 4.57 10*6/uL (ref 3.80–5.10)
RDW: 12.6 % (ref 11.0–15.0)
Total Lymphocyte: 24.7 %
WBC: 5.2 10*3/uL (ref 3.8–10.8)

## 2019-09-30 LAB — URINALYSIS, ROUTINE W REFLEX MICROSCOPIC
Bacteria, UA: NONE SEEN /HPF
Bilirubin Urine: NEGATIVE
Glucose, UA: NEGATIVE
Hgb urine dipstick: NEGATIVE
Hyaline Cast: NONE SEEN /LPF
Ketones, ur: NEGATIVE
Nitrite: NEGATIVE
Protein, ur: NEGATIVE
RBC / HPF: NONE SEEN /HPF (ref 0–2)
Specific Gravity, Urine: 1.006 (ref 1.001–1.03)
Squamous Epithelial / HPF: NONE SEEN /HPF (ref <=5)
WBC, UA: NONE SEEN /HPF (ref 0–5)
pH: 7.5 (ref 5.0–8.0)

## 2019-09-30 LAB — C3 AND C4
C3 Complement: 137 mg/dL (ref 83–193)
C4 Complement: 24 mg/dL (ref 15–57)

## 2019-09-30 LAB — ANTI-DNA ANTIBODY, DOUBLE-STRANDED: ds DNA Ab: 2 IU/mL

## 2019-09-30 LAB — SEDIMENTATION RATE: Sed Rate: 2 mm/h (ref 0–30)

## 2019-09-30 NOTE — Progress Notes (Signed)
LFTs are mildly elevated, most likely due to statin use.  All autoimmune labs are negative now.  We discussed decreasing Plaquenil to every other day at the last visit.

## 2019-10-27 DIAGNOSIS — F332 Major depressive disorder, recurrent severe without psychotic features: Secondary | ICD-10-CM | POA: Diagnosis not present

## 2019-10-27 DIAGNOSIS — F061 Catatonic disorder due to known physiological condition: Secondary | ICD-10-CM | POA: Diagnosis not present

## 2019-10-27 DIAGNOSIS — G4700 Insomnia, unspecified: Secondary | ICD-10-CM | POA: Diagnosis not present

## 2019-11-04 ENCOUNTER — Ambulatory Visit (INDEPENDENT_AMBULATORY_CARE_PROVIDER_SITE_OTHER): Payer: PPO | Admitting: Psychology

## 2019-11-04 DIAGNOSIS — F332 Major depressive disorder, recurrent severe without psychotic features: Secondary | ICD-10-CM | POA: Diagnosis not present

## 2019-11-10 DIAGNOSIS — F061 Catatonic disorder due to known physiological condition: Secondary | ICD-10-CM | POA: Diagnosis not present

## 2019-11-10 DIAGNOSIS — F339 Major depressive disorder, recurrent, unspecified: Secondary | ICD-10-CM | POA: Diagnosis not present

## 2019-11-23 NOTE — Progress Notes (Deleted)
PATIENT: Brianna Ross DOB: [birth year]  REASON FOR VISIT: follow up HISTORY FROM: patient  Virtual Visit via Video Note  I connected with Brianna Ross on 11/23/19 at  1:30 PM EST by a video enabled telemedicine application located remotely at [facility] and verified that I am speaking with the correct person using two identifiers who was located at their own home.   I discussed the limitations of evaluation and management by telemedicine and the availability of in person appointments. The patient expressed understanding and agreed to proceed.   PATIENT: Brianna Ross DOB: 63-Apr-1957  REASON FOR VISIT: follow up HISTORY FROM: patient  HISTORY OF PRESENT ILLNESS: Today 11/23/19  HISTORY   REVIEW OF SYSTEMS: Out of a complete 14 system review of symptoms, the patient complains only of the following symptoms, and all other reviewed systems are negative.  ALLERGIES: No Known Allergies  HOME MEDICATIONS: Outpatient Medications Prior to Visit  Medication Sig Dispense Refill  . acetaminophen (TYLENOL) 500 MG tablet Take 500 mg by mouth 2 (two) times daily as needed (pain).    Marland Kitchen aspirin EC 81 MG tablet Take 81 mg by mouth daily.     Marland Kitchen buPROPion (WELLBUTRIN XL) 150 MG 24 hr tablet Take 150 mg by mouth daily.    . Calcium 500 MG tablet Take 2 tablets by mouth daily.    . Cholecalciferol (VITAMIN D) 2000 units tablet Take 4,000 Units by mouth daily.     . clonazePAM (KLONOPIN) 0.5 MG tablet Take 1 mg by mouth at bedtime.     . diclofenac sodium (VOLTAREN) 1 % GEL Apply 3 g to 3 large joints up to 3 times daily. 3 Tube 3  . fluorometholone (FML) 0.1 % ophthalmic suspension SHAKE LQ AND INT 1 GTT IN OS D  5  . hydroxychloroquine (PLAQUENIL) 200 MG tablet Take 1 tablet (200 mg total) by mouth daily. 90 tablet 0  . NONFORMULARY OR COMPOUNDED ITEM at bedtime.    Marland Kitchen OLANZapine (ZYPREXA) 5 MG tablet Take 5 mg by mouth at bedtime.    . rosuvastatin (CRESTOR) 20 MG tablet     .  valACYclovir (VALTREX) 1000 MG tablet Take 1,000 mg by mouth daily.    Marland Kitchen venlafaxine XR (EFFEXOR-XR) 150 MG 24 hr capsule Take 2 capsules (300 mg total) by mouth daily. 180 capsule 1   No facility-administered medications prior to visit.    PAST MEDICAL HISTORY: Past Medical History:  Diagnosis Date  . Abnormal stress electrocardiogram test   . Allergy   . ANA positive   . Atypical nevi   . Depression   . Headache(784.0)   . Hives 2011   x5  . Hyperlipidemia   . Menopause   . OSA (obstructive sleep apnea)   . Osteoarthritis   . Right wrist fracture   . Rosacea   . Shingles 03/2012  . Shortness of breath   . Sleep apnea   . Urticaria     PAST SURGICAL HISTORY: Past Surgical History:  Procedure Laterality Date  . ORIF WRIST FRACTURE    . thymic cyst     removal  . WISDOM TOOTH EXTRACTION      FAMILY HISTORY: Family History  Problem Relation Age of Onset  . Glaucoma Mother   . Hyperlipidemia Mother   . Depression Mother   . Osteoporosis Mother   . Breast cancer Mother   . Heart disease Father   . Hyperlipidemia Father   . Hypertension Father   .  Heart defect Sister   . Heart disease Sister 13       congenital heart defect,deceased  . Hyperlipidemia Brother   . Depression Brother   . High Cholesterol Brother   . Cancer Paternal Grandmother   . Stroke Paternal Grandfather   . Heart disease Paternal Grandfather     SOCIAL HISTORY: Social History   Socioeconomic History  . Marital status: Married    Spouse name: Sonny  . Number of children: 0  . Years of education: 74  . Highest education level: Not on file  Occupational History    Employer: [employer]  Tobacco Use  . Smoking status: Never Smoker  . Smokeless tobacco: Never Used  Vaping Use  . Vaping Use: Never used  Substance and Sexual Activity  . Alcohol use: No  . Drug use: Never  . Sexual activity: Not Currently    Birth control/protection: Post-menopausal  Other  Topics Concern  . Not on file  Social History Narrative   Patient works for [employer].(full-time_    Patient lives at home with her husband [name]).   Patient does not have any children.   Patient is right-handed.   Patient drinks one cup of coffee everyday and two cups of tea daily.   Social Determinants of Health   Financial Resource Strain:   . Difficulty of Paying Living Expenses: Not on file  Food Insecurity:   . Worried About Charity fundraiser in the Last Year: Not on file  . Ran Out of Food in the Last Year: Not on file  Transportation Needs:   . Lack of Transportation (Medical): Not on file  . Lack of Transportation (Non-Medical): Not on file  Physical Activity:   . Days of Exercise per Week: Not on file  . Minutes of Exercise per Session: Not on file  Stress:   . Feeling of Stress : Not on file  Social Connections:   . Frequency of Communication with Friends and Family: Not on file  . Frequency of Social Gatherings with Friends and Family: Not on file  . Attends Religious Services: Not on file  . Active Member of Clubs or Organizations: Not on file  . Attends Archivist Meetings: Not on file  . Marital Status: Not on file  Intimate Partner Violence:   . Fear of Current or Ex-Partner: Not on file  . Emotionally Abused: Not on file  . Physically Abused: Not on file  . Sexually Abused: Not on file      PHYSICAL EXAM Generalized: Well developed, in no acute distress   Neurological examination  Mentation: Alert oriented to time, place, history taking. Follows all commands speech and language fluent Cranial nerve II-XII:Extraocular movements were full. Facial symmetry noted. uvula tongue midline. Head turning and shoulder shrug  were normal and symmetric. Motor: Good strength throughout subjectively per patient Sensory: Sensory testing is intact to soft touch on all 4 extremities subjectively per patient Coordination: Cerebellar  testing reveals good finger-nose-finger  Gait and station: Patient is able to stand from a seated position. gait is normal.  Reflexes: UTA  DIAGNOSTIC DATA (LABS, IMAGING, TESTING) - I reviewed patient records, labs, notes, testing and imaging myself where available.  Lab Results  Component Value Date   WBC 5.2 09/29/2019   HGB 13.9 09/29/2019   HCT 41.9 09/29/2019   MCV 91.7 09/29/2019   PLT 227 09/29/2019      Component Value Date/Time   NA 142 09/29/2019 1141  K 4.3 09/29/2019 1141   CL 105 09/29/2019 1141   CO2 31 09/29/2019 1141   GLUCOSE 81 09/29/2019 1141   BUN 9 09/29/2019 1141   CREATININE 0.77 09/29/2019 1141   CALCIUM 10.1 09/29/2019 1141   PROT 6.5 09/29/2019 1141   ALBUMIN 4.9 04/25/2016 1158   AST 28 09/29/2019 1141   ALT 47 (H) 09/29/2019 1141   ALKPHOS 48 04/25/2016 1158   BILITOT 0.4 09/29/2019 1141   GFRNONAA 82 09/29/2019 1141   GFRAA 95 09/29/2019 1141   Lab Results  Component Value Date   CHOL 188 11/13/2013   HDL 68 11/13/2013   LDLCALC 104 (H) 11/13/2013   TRIG 82 11/13/2013   CHOLHDL 2.8 11/13/2013   No results found for: HGBA1C No results found for: VITAMINB12 Lab Results  Component Value Date   TSH 1.684 11/13/2013      ASSESSMENT AND PLAN 64 y.o. year old female  has a past medical history of Abnormal stress electrocardiogram test, Allergy, ANA positive, Atypical nevi, Depression, Headache(784.0), Hives (2011), Hyperlipidemia, Menopause, OSA (obstructive sleep apnea), Osteoarthritis, Right wrist fracture, Rosacea, Shingles (03/2012), Shortness of breath, Sleep apnea, and Urticaria. here with:  OSA on CPAP  . CPAP compliance excellent . Residual AHI is good . Encouraged patient to continue using CPAP nightly and > 4 hours each night . F/U in 1 year or sooner if needed  I spent 20 minutes of face-to-face and non-face-to-face time with patient.  This included previsit chart review, lab review, study review, order entry, electronic  health record documentation, patient education.  Ward Givens, MSN, NP-C 11/23/2019, 4:44 PM [facility] [address] [place], [place] [address] [phone]

## 2019-11-24 ENCOUNTER — Telehealth (INDEPENDENT_AMBULATORY_CARE_PROVIDER_SITE_OTHER): Payer: PPO | Admitting: Adult Health

## 2019-11-24 DIAGNOSIS — G4733 Obstructive sleep apnea (adult) (pediatric): Secondary | ICD-10-CM

## 2019-11-24 NOTE — Progress Notes (Signed)
PATIENT: Brianna Ross DOB: 1955/05/26  REASON FOR VISIT: follow up HISTORY FROM: patient  Virtual Visit via Video Note  I connected with Brianna Ross on 11/24/19 at  1:30 PM EST by a video enabled telemedicine application located remotely at [facility] and verified that I am speaking with the correct person using two identifiers who was located at their own home.   I discussed the limitations of evaluation and management by telemedicine and the availability of in person appointments. The patient expressed understanding and agreed to proceed.   PATIENT: Brianna Ross DOB: Dec 05, 1955  REASON FOR VISIT: follow up HISTORY FROM: patient  HISTORY OF PRESENT ILLNESS: Today 11/24/19:  Brianna Ross is a 64 year old female with a history of obstructive sleep apnea on BiPAP. She returns today for follow-up. She reports that the BiPAP continues to work well for her. She denies any new issues. Her machine requires a card download therefore we are unable to obtain any data wirelessly today. She will bring her card in for download.  HISTORY Brianna Ross is a 64 y.o. female patient who has seen last as a patient of Dr. Coralyn Mark ( now retired) for BiPAP compliance.  08-19-2018,  Treatment resistant depression, underwent ECT in 2016, when she felt it worked for her but "messed up" her memory , and left her confidence shaken. She has tried transcranial magnetic stimulation, but had no significant success.  No headaches at this time, the patient endorsed 3 points on the Epworth sleepiness score she usually endorsed 2 points for lying down to rest in the afternoon and 1 for falling asleep while watching television.  She has been a compliant BiPAP user with a setting of 13 cm over 8 cm BiPAP easy bruises on she has used the machine 30 out of 30 days last night both the night of 18 August 2018.  Average user time of 7 hours 25 minutes.  Her residual AHI however has risen to 8.7 of which half hour  central and half obstructive in origin.  This residual AHI is higher than I like and she also has a lot of air leakage.  95th percentile air leak was 55.3 L/min. She has felt air leaking- and has taken the mask of at night.  She uses a mask. FFM- [employer] FFM  F 30 medium. [employer].  I will order a new F 30 I mask for her to be fitted.     REVIEW OF SYSTEMS: Out of a complete 14 system review of symptoms, the patient complains only of the following symptoms, and all other reviewed systems are negative.  ALLERGIES: No Known Allergies  HOME MEDICATIONS: Outpatient Medications Prior to Visit  Medication Sig Dispense Refill  . acetaminophen (TYLENOL) 500 MG tablet Take 500 mg by mouth 2 (two) times daily as needed (pain).    Marland Kitchen aspirin EC 81 MG tablet Take 81 mg by mouth daily.     Marland Kitchen buPROPion (WELLBUTRIN XL) 150 MG 24 hr tablet Take 150 mg by mouth daily.    . Calcium 500 MG tablet Take 2 tablets by mouth daily.    . Cholecalciferol (VITAMIN D) 2000 units tablet Take 4,000 Units by mouth daily.     . clonazePAM (KLONOPIN) 0.5 MG tablet Take 1 mg by mouth at bedtime.     . diclofenac sodium (VOLTAREN) 1 % GEL Apply 3 g to 3 large joints up to 3 times daily. 3 Tube 3  . fluorometholone (FML) 0.1 % ophthalmic  suspension SHAKE LQ AND INT 1 GTT IN OS D  5  . hydroxychloroquine (PLAQUENIL) 200 MG tablet Take 1 tablet (200 mg total) by mouth daily. 90 tablet 0  . NONFORMULARY OR COMPOUNDED ITEM at bedtime.    Marland Kitchen OLANZapine (ZYPREXA) 5 MG tablet Take 5 mg by mouth at bedtime.    . rosuvastatin (CRESTOR) 20 MG tablet     . valACYclovir (VALTREX) 1000 MG tablet Take 1,000 mg by mouth daily.    Marland Kitchen venlafaxine XR (EFFEXOR-XR) 150 MG 24 hr capsule Take 2 capsules (300 mg total) by mouth daily. 180 capsule 1   No facility-administered medications prior to visit.    PAST MEDICAL HISTORY: Past Medical History:  Diagnosis Date  . Abnormal stress electrocardiogram test   . Allergy   . ANA positive    . Atypical nevi   . Depression   . Headache(784.0)   . Hives 2011   x5  . Hyperlipidemia   . Menopause   . OSA (obstructive sleep apnea)   . Osteoarthritis   . Right wrist fracture   . Rosacea   . Shingles 03/2012  . Shortness of breath   . Sleep apnea   . Urticaria     PAST SURGICAL HISTORY: Past Surgical History:  Procedure Laterality Date  . ORIF WRIST FRACTURE    . thymic cyst     removal  . WISDOM TOOTH EXTRACTION      FAMILY HISTORY: Family History  Problem Relation Age of Onset  . Glaucoma Mother   . Hyperlipidemia Mother   . Depression Mother   . Osteoporosis Mother   . Breast cancer Mother   . Heart disease Father   . Hyperlipidemia Father   . Hypertension Father   . Heart defect Sister   . Heart disease Sister 13       congenital heart defect,deceased  . Hyperlipidemia Brother   . Depression Brother   . High Cholesterol Brother   . Cancer Paternal Grandmother   . Stroke Paternal Grandfather   . Heart disease Paternal Grandfather     SOCIAL HISTORY: Social History   Socioeconomic History  . Marital status: Married    Spouse name: Sonny  . Number of children: 0  . Years of education: 35  . Highest education level: Not on file  Occupational History    Employer: [employer]  Tobacco Use  . Smoking status: Never Smoker  . Smokeless tobacco: Never Used  Vaping Use  . Vaping Use: Never used  Substance and Sexual Activity  . Alcohol use: No  . Drug use: Never  . Sexual activity: Not Currently    Birth control/protection: Post-menopausal  Other Topics Concern  . Not on file  Social History Narrative   Patient works for [employer].(full-time_    Patient lives at home with her husband [name]).   Patient does not have any children.   Patient is right-handed.   Patient drinks one cup of coffee everyday and two cups of tea daily.   Social Determinants of Health   Financial Resource Strain:   .  Difficulty of Paying Living Expenses: Not on file  Food Insecurity:   . Worried About Charity fundraiser in the Last Year: Not on file  . Ran Out of Food in the Last Year: Not on file  Transportation Needs:   . Lack of Transportation (Medical): Not on file  . Lack of Transportation (Non-Medical): Not on file  Physical Activity:   .  Days of Exercise per Week: Not on file  . Minutes of Exercise per Session: Not on file  Stress:   . Feeling of Stress : Not on file  Social Connections:   . Frequency of Communication with Friends and Family: Not on file  . Frequency of Social Gatherings with Friends and Family: Not on file  . Attends Religious Services: Not on file  . Active Member of Clubs or Organizations: Not on file  . Attends Archivist Meetings: Not on file  . Marital Status: Not on file  Intimate Partner Violence:   . Fear of Current or Ex-Partner: Not on file  . Emotionally Abused: Not on file  . Physically Abused: Not on file  . Sexually Abused: Not on file      PHYSICAL EXAM Generalized: Well developed, in no acute distress   Neurological examination  Mentation: Alert oriented to time, place, history taking. Follows all commands speech and language fluent Cranial nerve II-XII:Extraocular movements were full. Facial symmetry noted. uvula tongue midline. Head turning and shoulder shrug  were normal and symmetric. Motor: Good strength throughout subjectively per patient Sensory: Sensory testing is intact to soft touch on all 4 extremities subjectively per patient Coordination: Cerebellar testing reveals good finger-nose-finger  Gait and station: Patient is able to stand from a seated position. gait is normal.  Reflexes: UTA  DIAGNOSTIC DATA (LABS, IMAGING, TESTING) - I reviewed patient records, labs, notes, testing and imaging myself where available.  Lab Results  Component Value Date   WBC 5.2 09/29/2019   HGB 13.9 09/29/2019   HCT 41.9 09/29/2019   MCV  91.7 09/29/2019   PLT 227 09/29/2019      Component Value Date/Time   NA 142 09/29/2019 1141   K 4.3 09/29/2019 1141   CL 105 09/29/2019 1141   CO2 31 09/29/2019 1141   GLUCOSE 81 09/29/2019 1141   BUN 9 09/29/2019 1141   CREATININE 0.77 09/29/2019 1141   CALCIUM 10.1 09/29/2019 1141   PROT 6.5 09/29/2019 1141   ALBUMIN 4.9 04/25/2016 1158   AST 28 09/29/2019 1141   ALT 47 (H) 09/29/2019 1141   ALKPHOS 48 04/25/2016 1158   BILITOT 0.4 09/29/2019 1141   GFRNONAA 82 09/29/2019 1141   GFRAA 95 09/29/2019 1141   Lab Results  Component Value Date   CHOL 188 11/13/2013   HDL 68 11/13/2013   LDLCALC 104 (H) 11/13/2013   TRIG 82 11/13/2013   CHOLHDL 2.8 11/13/2013   No results found for: HGBA1C No results found for: VITAMINB12 Lab Results  Component Value Date   TSH 1.684 11/13/2013      ASSESSMENT AND PLAN 64 y.o. year old female  has a past medical history of Abnormal stress electrocardiogram test, Allergy, ANA positive, Atypical nevi, Depression, Headache(784.0), Hives (2011), Hyperlipidemia, Menopause, OSA (obstructive sleep apnea), Osteoarthritis, Right wrist fracture, Rosacea, Shingles (03/2012), Shortness of breath, Sleep apnea, and Urticaria. here with:  OSA on BiPAP  . Patient advised to bring her card by our office at her convenience for a download . Encouraged patient to continue using BiPAP nightly and > 4 hours each night . F/U in 1 year or sooner if needed  I spent 20 minutes of face-to-face and non-face-to-face time with patient.  This included previsit chart review, lab review, study review, order entry, electronic health record documentation, patient education.  Ward Givens, MSN, NP-C 11/24/2019, 1:41 PM [facility] [address] [place], [place] [address] [phone]

## 2019-11-30 DIAGNOSIS — F061 Catatonic disorder due to known physiological condition: Secondary | ICD-10-CM | POA: Diagnosis not present

## 2019-11-30 DIAGNOSIS — F329 Major depressive disorder, single episode, unspecified: Secondary | ICD-10-CM | POA: Diagnosis not present

## 2019-11-30 DIAGNOSIS — F322 Major depressive disorder, single episode, severe without psychotic features: Secondary | ICD-10-CM | POA: Diagnosis not present

## 2019-11-30 DIAGNOSIS — E7800 Pure hypercholesterolemia, unspecified: Secondary | ICD-10-CM | POA: Diagnosis not present

## 2019-11-30 DIAGNOSIS — E785 Hyperlipidemia, unspecified: Secondary | ICD-10-CM | POA: Diagnosis not present

## 2019-11-30 DIAGNOSIS — F3341 Major depressive disorder, recurrent, in partial remission: Secondary | ICD-10-CM | POA: Diagnosis not present

## 2019-12-04 ENCOUNTER — Other Ambulatory Visit: Payer: Self-pay | Admitting: Physician Assistant

## 2019-12-04 DIAGNOSIS — M359 Systemic involvement of connective tissue, unspecified: Secondary | ICD-10-CM

## 2019-12-04 NOTE — Telephone Encounter (Signed)
Last Visit: 09/29/2019 Next Visit: 02/29/2020 Labs: 09/29/2019 LFTs are mildly elevated, Eye exam: 04/09/19   Current Dose per office note 09/29/2019: Plaquenil 200 mg, 1 tablet p.o. daily DX: Autoimmune disease  Okay to refill Plaquenil?

## 2019-12-15 DIAGNOSIS — F411 Generalized anxiety disorder: Secondary | ICD-10-CM | POA: Diagnosis not present

## 2019-12-15 DIAGNOSIS — F339 Major depressive disorder, recurrent, unspecified: Secondary | ICD-10-CM | POA: Diagnosis not present

## 2019-12-15 DIAGNOSIS — F061 Catatonic disorder due to known physiological condition: Secondary | ICD-10-CM | POA: Diagnosis not present

## 2019-12-15 DIAGNOSIS — F5101 Primary insomnia: Secondary | ICD-10-CM | POA: Diagnosis not present

## 2019-12-18 DIAGNOSIS — F322 Major depressive disorder, single episode, severe without psychotic features: Secondary | ICD-10-CM | POA: Diagnosis not present

## 2019-12-18 DIAGNOSIS — F061 Catatonic disorder due to known physiological condition: Secondary | ICD-10-CM | POA: Diagnosis not present

## 2019-12-18 DIAGNOSIS — F329 Major depressive disorder, single episode, unspecified: Secondary | ICD-10-CM | POA: Diagnosis not present

## 2019-12-18 DIAGNOSIS — E785 Hyperlipidemia, unspecified: Secondary | ICD-10-CM | POA: Diagnosis not present

## 2019-12-18 DIAGNOSIS — F3341 Major depressive disorder, recurrent, in partial remission: Secondary | ICD-10-CM | POA: Diagnosis not present

## 2019-12-18 DIAGNOSIS — E7800 Pure hypercholesterolemia, unspecified: Secondary | ICD-10-CM | POA: Diagnosis not present

## 2019-12-30 ENCOUNTER — Ambulatory Visit (INDEPENDENT_AMBULATORY_CARE_PROVIDER_SITE_OTHER): Payer: PPO | Admitting: Psychology

## 2019-12-30 DIAGNOSIS — F332 Major depressive disorder, recurrent severe without psychotic features: Secondary | ICD-10-CM | POA: Diagnosis not present

## 2020-01-20 ENCOUNTER — Other Ambulatory Visit: Payer: Self-pay | Admitting: Adult Health

## 2020-01-20 DIAGNOSIS — F411 Generalized anxiety disorder: Secondary | ICD-10-CM | POA: Diagnosis not present

## 2020-01-20 DIAGNOSIS — G4733 Obstructive sleep apnea (adult) (pediatric): Secondary | ICD-10-CM

## 2020-01-20 DIAGNOSIS — F339 Major depressive disorder, recurrent, unspecified: Secondary | ICD-10-CM | POA: Diagnosis not present

## 2020-01-20 DIAGNOSIS — F061 Catatonic disorder due to known physiological condition: Secondary | ICD-10-CM | POA: Diagnosis not present

## 2020-01-20 DIAGNOSIS — F5101 Primary insomnia: Secondary | ICD-10-CM | POA: Diagnosis not present

## 2020-01-20 NOTE — Progress Notes (Signed)
Patient's download indicates that her residual AHI is 31 with 7 centrals and 20 obstructive.  Her leak in the 95th percentile is 29.4 L/min.  According to her previous download with Dr. Brett Fairy the leak is better.  Her pressure is set at 13/8.  Please advise the patient that I will send an order to her DME company increasing her pressure to 14/9.  We will need a repeat download in approximately 1 month.

## 2020-01-21 NOTE — Progress Notes (Signed)
Community message was sent to [place] regarding new CPAP orders

## 2020-01-21 NOTE — Progress Notes (Signed)
Brown, Jessica  Young, Sandra S, RN Thank you, will process.   

## 2020-01-22 DIAGNOSIS — F329 Major depressive disorder, single episode, unspecified: Secondary | ICD-10-CM | POA: Diagnosis not present

## 2020-01-22 DIAGNOSIS — F322 Major depressive disorder, single episode, severe without psychotic features: Secondary | ICD-10-CM | POA: Diagnosis not present

## 2020-01-22 DIAGNOSIS — E785 Hyperlipidemia, unspecified: Secondary | ICD-10-CM | POA: Diagnosis not present

## 2020-01-22 DIAGNOSIS — G4733 Obstructive sleep apnea (adult) (pediatric): Secondary | ICD-10-CM | POA: Diagnosis not present

## 2020-01-22 DIAGNOSIS — E7800 Pure hypercholesterolemia, unspecified: Secondary | ICD-10-CM | POA: Diagnosis not present

## 2020-01-22 DIAGNOSIS — F061 Catatonic disorder due to known physiological condition: Secondary | ICD-10-CM | POA: Diagnosis not present

## 2020-01-25 NOTE — Progress Notes (Signed)
Message received 01/21/20: "Got it, thanks!   Janett Billow"

## 2020-02-02 ENCOUNTER — Ambulatory Visit: Payer: PPO | Admitting: Psychology

## 2020-02-15 NOTE — Progress Notes (Signed)
Office Visit Note  Patient: Brianna Ross             Date of Birth: Mar 16, 1955           MRN: [id number]             PCP: Leeroy Cha, MD Referring: Leeroy Cha,* Visit Date: 02/29/2020 Occupation: @GUAROCC @  Subjective:  Hair loss   History of Present Illness: Brianna Ross is a 65 y.o. female with history of autoimmune disease and osteoarthritis.  Patient is taking Plaquenil 200 mg 1 tablet by mouth every other day.  She is tolerating Plaquenil without any side effects.  She denies any symptoms of a flare.  She denies any joint pain or joint swelling at this time.  She has not had any morning stiffness or nocturnal pain.  She denies any recent rashes, photosensitivity, symptoms of Raynaud's, oral or nasal ulcerations, sicca symptoms, chest pain, palpitations, or shortness of breath. She has noticed some increased hair loss over the past 4 months.  She has not had any swollen lymph nodes or infections recently.  She has been talking 35 minutes 4 days a week for exercise and plans to walk daily once the weather warms up. She states she fell about 10 months ago and has noticed a knot on her left knee which has remained. She is not having any pain, warmth, swelling, or instability in the left knee at this time.  She is not experiencing any fatigue and has been sleeping well at night. She denies any other new or worsening symptoms.      Activities of Daily Living:  Patient reports morning stiffness for 0 minutes.   Patient Denies nocturnal pain.  Difficulty dressing/grooming: Denies Difficulty climbing stairs: Denies Difficulty getting out of chair: Denies Difficulty using hands for taps, buttons, cutlery, and/or writing: Denies  Review of Systems  Constitutional: Negative for fatigue.  HENT: Negative for mouth sores, mouth dryness and nose dryness.   Eyes: Negative for pain, visual disturbance and dryness.  Respiratory: Negative for cough, hemoptysis, shortness of breath  and difficulty breathing.   Cardiovascular: Negative for chest pain, palpitations, hypertension and swelling in legs/feet.  Gastrointestinal: Negative for blood in stool, constipation and diarrhea.  Endocrine: Negative for increased urination.  Genitourinary: Negative for painful urination.  Musculoskeletal: Negative for arthralgias, joint pain, joint swelling, myalgias, muscle weakness, morning stiffness, muscle tenderness and myalgias.  Skin: Positive for hair loss. Negative for color change, pallor, rash, nodules/bumps, skin tightness, ulcers and sensitivity to sunlight.  Allergic/Immunologic: Negative for susceptible to infections.  Neurological: Negative for dizziness, numbness, headaches and weakness.  Hematological: Negative for swollen glands.  Psychiatric/Behavioral: Negative for depressed mood and sleep disturbance. The patient is not nervous/anxious.     PMFS History:  Patient Active Problem List   Diagnosis Date Noted  . 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 Complicated grief 48/18/5631  . Primary osteoarthritis of both hands 02/27/2016  . High risk medication use 02/20/2016  . Autoimmune disease (HCC)positive ANA, hypocomplementemia, positive beta 2 and positive anticardiolipin antibodies.  02/20/2016  . Sicca syndrome, unspecified 02/20/2016  . History of sleep apnea 02/20/2016  . Severe major depression without psychotic features ([place]) 02/24/2015  . OSA on CPAP 08/11/2014  . Hearing loss 06/29/2014  . OSA treated with BiPAP 08/06/2013  . Hyperlipidemia 04/29/2013  . 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 Family history of heart disease 12/10/2012  . 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  Positive ANA (antinuclear antibody) 03/06/2012  . Elevated aldolase level 03/06/2012  . Atypical nevi 11/14/2011  . Menopause 11/13/2011  . Rosacea 11/13/2011    Past Medical History:  Diagnosis Date  . Abnormal stress  electrocardiogram test   . Allergy   . ANA positive   . Atypical nevi   . Depression   . Headache(784.0)   . Hives 2011   x5  . Hyperlipidemia   . Menopause   . OSA (obstructive sleep apnea)   . Osteoarthritis   . Right wrist fracture   . Rosacea   . Shingles 03/2012  . Shortness of breath   . Sleep apnea   . Urticaria     Family History  Problem Relation Age of Onset  . Glaucoma Mother   . Hyperlipidemia Mother   . Depression Mother   . Osteoporosis Mother   . Breast cancer Mother   . Heart disease Father   . Hyperlipidemia Father   . Hypertension Father   . Heart defect Sister   . Heart disease Sister 13       congenital heart defect,deceased  . Hyperlipidemia Brother   . Depression Brother   . High Cholesterol Brother   . Cancer Paternal Grandmother   . Stroke Paternal Grandfather   . Heart disease Paternal Grandfather    Past Surgical History:  Procedure Laterality Date  . ORIF WRIST FRACTURE    . thymic cyst     removal  . WISDOM TOOTH EXTRACTION     Social History   Social History Narrative   Patient works for [employer].(full-time_    Patient lives at home with her husband [name]).   Patient does not have any children.   Patient is right-handed.   Patient drinks one cup of coffee everyday and two cups of tea daily.   Immunization History  Administered Date(s) Administered  . Influenza Split 11/13/2011  . Influenza,inj,Quad PF,6+ Mos 11/20/2016  . Influenza-Unspecified 09/11/2013  . Moderna Sars-Covid-2 Vaccination 02/25/2019, 03/25/2019, 11/24/2019  . Pneumococcal Polysaccharide-23 11/12/2012     Objective: Vital Signs: BP 119/86 (BP Location: Left Arm, Patient Position: Sitting, Cuff Size: Normal)   Pulse 94   Resp 14   Ht 5' 4.5" (1.638 m)   Wt 154 lb 12.8 oz (70.2 kg)   BMI 26.16 kg/m    Physical Exam   Musculoskeletal Exam: C-spine, thoracic spine, and lumbar spine good ROM.  Shoulder joints, elbow joints, wrist  joints, MCPs, PIPs, and DIPs good ROM with no synovitis.  Complete   CDAI Exam: CDAI Score: - Patient Global: -; Provider Global: - Swollen: -; Tender: - Joint Exam 02/29/2020   No joint exam has been documented for this visit   There is currently no information documented on the homunculus. Go to the Rheumatology activity and complete the homunculus joint exam.  Investigation: No additional findings.  Imaging: No results found.  Recent Labs: Lab Results  Component Value Date   WBC 5.2 09/29/2019   HGB 13.9 09/29/2019   PLT 227 09/29/2019   NA 142 09/29/2019   K 4.3 09/29/2019   CL 105 09/29/2019   CO2 31 09/29/2019   GLUCOSE 81 09/29/2019   BUN 9 09/29/2019   CREATININE 0.77 09/29/2019   BILITOT 0.4 09/29/2019   ALKPHOS 48 04/25/2016   AST 28 09/29/2019   ALT 47 (H) 09/29/2019   PROT 6.5 09/29/2019   ALBUMIN 4.9 04/25/2016   CALCIUM 10.1 09/29/2019   GFRAA 95 09/29/2019  Speciality Comments: PLQ Eye Exam: 04/09/19 @ [facility] Follow up in 1 year  Procedures:  No procedures performed Allergies: Patient has no known allergies.   Assessment / Plan:     Visit Diagnoses: Autoimmune disease (HCC)positive ANA, hypocomplementemia, positive beta 2 and positive anticardiolipin antibodies.  She has not had any signs or symptoms of a systemic autoimmune disease flare recently.  She has clinically been doing well taking Plaquenil 200 mg 1 tablet by mouth every other day.  She reduce the dose of Plaquenil after her last office visit and has not developed any new or worsening symptoms.  She has not had any recent rashes, photosensitivity, symptoms of Raynaud's, oral or nasal ulcerations, sicca symptoms, cervical lymphadenopathy, shortness of breath, palpitations, or pleuritic chest pain.  She has noticed some increased hair loss over the past 4 months.  We discussed trying Biotin or nutrafol OTC.  She is not experiencing any joint pain or tenderness at this time.  She  had no synovitis on examination today.  Lab work from 09/29/2019 was reviewed with the patient today in the office: ESR within normal limits, complements within normal limits, double-stranded DNA negative, and CBC within normal limits.  We will repeat the following lab work today since she has reduced the dose of Plaquenil.  She will continue taking Plaquenil as prescribed.  She was advised to notify us if she develops signs or symptoms of a flare.  She will follow-up in the office in 5 months.- Plan: Anti-DNA antibody, double-stranded, C3 and C4, Sedimentation rate, CBC with Differential/Platelet, COMPLETE METABOLIC PANEL WITH GFR, Urinalysis, Routine w reflex microscopic, Cardiolipin antibodies, IgG, IgM, IgA, ANA  High risk medication use - Plaquenil 200 mg 1 tablet by mouth every other day. CBC and CMP were updated on 09/29/2019.  She is due to update lab work today.  Orders for CBC and CMP were released.  PLQ Eye Exam: 04/09/19 @ [facility] Follow up in 1 year.  She was given a Plaquenil eye exam form to take with her to her upcoming appointment. She has not had any recent infections. She has received 3 moderna covid-19 vaccine doses.   - Plan: CBC with Differential/Platelet, COMPLETE METABOLIC PANEL WITH GFR  Sicca syndrome ([place]): She has not been experiencing any sicca symptoms recently.  No cervical lymphadenopathy was palpable.  Osteopenia of multiple sites - DEXA scan done on September 11, 2017 showed a T score of -2.3 in the right femoral neck.  She is overdue to update DEXA.  Future order placed today.  She is taking calcium and vitamin D supplement on a daily basis.  She had a fall about 10 months ago but did not have any fracture or injury. - Plan: DG BONE DENSITY (DXA)  Primary osteoarthritis of both hands: She has PIP and DIP thickening consistent with osteoarthritis of both hands.  No joint tenderness or inflammation was noted.  She was able to make a complete fist  bilaterally.  Primary osteoarthritis of both knees: She has good range of motion of both knee joints on exam.  No warmth or effusion was noted.  She has bilateral knee crepitus.  We discussed the importance of lower extremity muscle strengthening.  She has been walking for 35 minutes 4 days a week for exercise and was encouraged to try to walk on a daily basis.  Other medical conditions are listed as follows:  History of herpes zoster keratoconjunctivitis  Rosacea  History of sleep apnea  Severe major depression  without psychotic features ([place])  History of hyperlipidemia  Orders: Orders Placed This Encounter  Procedures  . DG BONE DENSITY (DXA)  . Anti-DNA antibody, double-stranded  . C3 and C4  . Sedimentation rate  . CBC with Differential/Platelet  . COMPLETE METABOLIC PANEL WITH GFR  . Urinalysis, Routine w reflex microscopic  . Cardiolipin antibodies, IgG, IgM, IgA  . ANA   No orders of the defined types were placed in this encounter.    Follow-Up Instructions: Return in about 5 months (around 07/28/2020) for Autoimmune Disease.   Ofilia Neas, PA-C  Note - This record has been created using Dragon software.  Chart creation errors have been sought, but may not always  have been located. Such creation errors do not reflect on  the standard of medical care.

## 2020-02-16 DIAGNOSIS — F411 Generalized anxiety disorder: Secondary | ICD-10-CM | POA: Diagnosis not present

## 2020-02-16 DIAGNOSIS — F061 Catatonic disorder due to known physiological condition: Secondary | ICD-10-CM | POA: Diagnosis not present

## 2020-02-16 DIAGNOSIS — F5101 Primary insomnia: Secondary | ICD-10-CM | POA: Diagnosis not present

## 2020-02-16 DIAGNOSIS — F339 Major depressive disorder, recurrent, unspecified: Secondary | ICD-10-CM | POA: Diagnosis not present

## 2020-02-24 ENCOUNTER — Telehealth: Payer: Self-pay | Admitting: Neurology

## 2020-02-24 NOTE — Telephone Encounter (Signed)
noted 

## 2020-02-24 NOTE — Telephone Encounter (Signed)
Patient presented to the lobby today with her SD card. Pt had pressure to BiPAP changes a month ago and was told to bring it in to have downloaded.  Pt indicated that she is doing well under the change. I have printed the report for Ward Givens, NP to review for the patient.

## 2020-02-24 NOTE — Telephone Encounter (Signed)
Negative reviewed the downloaded patient has an AHI of 25.  Despite patient stating she feels better the events are still elevated.  I do recommend increasing the pressure once more.  We will change the BiPAP to 16/10 cm water pressure. I will send the order to [employer] to be made for the patient. Will have the patient repeat another download in month. Will advise the patient that we may have to potentially complete a bipap titration study.

## 2020-02-25 ENCOUNTER — Other Ambulatory Visit: Payer: Self-pay | Admitting: Neurology

## 2020-02-25 DIAGNOSIS — G4733 Obstructive sleep apnea (adult) (pediatric): Secondary | ICD-10-CM

## 2020-02-25 NOTE — Telephone Encounter (Signed)
Called the patient to make her aware that this change was being made.  Advised her that once the company changes the pressure and makes sure her mask is sealing appropriately, Brianna Ross would like for her to bring her card in for another download in approximately 1 month.  Advised the patient that if the apnea events are still elevated after this increase to the BiPAP pressure, we may need to proceed with a BiPAP titration study.  Advised the patient what a BiPAP titration study consists of and answered questions.  Patient verbalized understanding.

## 2020-02-29 ENCOUNTER — Encounter: Payer: Self-pay | Admitting: Physician Assistant

## 2020-02-29 ENCOUNTER — Ambulatory Visit: Payer: PPO | Admitting: Physician Assistant

## 2020-02-29 ENCOUNTER — Other Ambulatory Visit: Payer: Self-pay

## 2020-02-29 VITALS — BP 119/86 | HR 94 | Resp 14 | Ht 64.5 in | Wt 154.8 lb

## 2020-02-29 DIAGNOSIS — M19042 Primary osteoarthritis, left hand: Secondary | ICD-10-CM | POA: Diagnosis not present

## 2020-02-29 DIAGNOSIS — L719 Rosacea, unspecified: Secondary | ICD-10-CM | POA: Diagnosis not present

## 2020-02-29 DIAGNOSIS — M3500 Sicca syndrome, unspecified: Secondary | ICD-10-CM

## 2020-02-29 DIAGNOSIS — M8589 Other specified disorders of bone density and structure, multiple sites: Secondary | ICD-10-CM | POA: Diagnosis not present

## 2020-02-29 DIAGNOSIS — M359 Systemic involvement of connective tissue, unspecified: Secondary | ICD-10-CM

## 2020-02-29 DIAGNOSIS — Z8619 Personal history of other infectious and parasitic diseases: Secondary | ICD-10-CM

## 2020-02-29 DIAGNOSIS — Z8669 Personal history of other diseases of the nervous system and sense organs: Secondary | ICD-10-CM

## 2020-02-29 DIAGNOSIS — M170 Bilateral primary osteoarthritis of knee: Secondary | ICD-10-CM

## 2020-02-29 DIAGNOSIS — Z79899 Other long term (current) drug therapy: Secondary | ICD-10-CM | POA: Diagnosis not present

## 2020-02-29 DIAGNOSIS — Z8639 Personal history of other endocrine, nutritional and metabolic disease: Secondary | ICD-10-CM

## 2020-02-29 DIAGNOSIS — M19041 Primary osteoarthritis, right hand: Secondary | ICD-10-CM

## 2020-02-29 DIAGNOSIS — F322 Major depressive disorder, single episode, severe without psychotic features: Secondary | ICD-10-CM | POA: Diagnosis not present

## 2020-03-01 NOTE — Progress Notes (Signed)
CBC and CMP WNL.  ESR and complements WNL.  UA normal.

## 2020-03-02 ENCOUNTER — Ambulatory Visit (INDEPENDENT_AMBULATORY_CARE_PROVIDER_SITE_OTHER): Payer: PPO | Admitting: Psychology

## 2020-03-02 DIAGNOSIS — F332 Major depressive disorder, recurrent severe without psychotic features: Secondary | ICD-10-CM | POA: Diagnosis not present

## 2020-03-02 LAB — CBC WITH DIFFERENTIAL/PLATELET
Absolute Monocytes: 602 cells/uL (ref 200–950)
Basophils Absolute: 60 cells/uL (ref 0–200)
Basophils Relative: 1.4 %
Eosinophils Absolute: 129 cells/uL (ref 15–500)
Eosinophils Relative: 3 %
HCT: 42.1 % (ref 35.0–45.0)
Hemoglobin: 14.3 g/dL (ref 11.7–15.5)
Lymphs Abs: 1062 cells/uL (ref 850–3900)
MCH: 30.4 pg (ref 27.0–33.0)
MCHC: 34 g/dL (ref 32.0–36.0)
MCV: 89.4 fL (ref 80.0–100.0)
MPV: 9.8 fL (ref 7.5–12.5)
Monocytes Relative: 14 %
Neutro Abs: 2447 cells/uL (ref 1500–7800)
Neutrophils Relative %: 56.9 %
Platelets: 229 10*3/uL (ref 140–400)
RBC: 4.71 10*6/uL (ref 3.80–5.10)
RDW: 12.3 % (ref 11.0–15.0)
Total Lymphocyte: 24.7 %
WBC: 4.3 10*3/uL (ref 3.8–10.8)

## 2020-03-02 LAB — COMPLETE METABOLIC PANEL WITH GFR
AG Ratio: 2.2 (calc) (ref 1.0–2.5)
ALT: 24 U/L (ref 6–29)
AST: 18 U/L (ref 10–35)
Albumin: 4.3 g/dL (ref 3.6–5.1)
Alkaline phosphatase (APISO): 50 U/L (ref 37–153)
BUN: 13 mg/dL (ref 7–25)
CO2: 29 mmol/L (ref 20–32)
Calcium: 9.6 mg/dL (ref 8.6–10.4)
Chloride: 105 mmol/L (ref 98–110)
Creat: 0.83 mg/dL (ref 0.50–0.99)
GFR, Est African American: 87 mL/min/{1.73_m2} (ref >=60)
GFR, Est Non African American: 75 mL/min/{1.73_m2} (ref >=60)
Globulin: 2 g/dL (calc) (ref 1.9–3.7)
Glucose, Bld: 88 mg/dL (ref 65–99)
Potassium: 4.3 mmol/L (ref 3.5–5.3)
Sodium: 142 mmol/L (ref 135–146)
Total Bilirubin: 0.4 mg/dL (ref 0.2–1.2)
Total Protein: 6.3 g/dL (ref 6.1–8.1)

## 2020-03-02 LAB — ANTI-DNA ANTIBODY, DOUBLE-STRANDED: ds DNA Ab: 1 IU/mL

## 2020-03-02 LAB — URINALYSIS, ROUTINE W REFLEX MICROSCOPIC
Bilirubin Urine: NEGATIVE
Glucose, UA: NEGATIVE
Hgb urine dipstick: NEGATIVE
Ketones, ur: NEGATIVE
Leukocytes,Ua: NEGATIVE
Nitrite: NEGATIVE
Protein, ur: NEGATIVE
Specific Gravity, Urine: 1.006 (ref 1.001–1.03)
pH: 7.5 (ref 5.0–8.0)

## 2020-03-02 LAB — CARDIOLIPIN ANTIBODIES, IGG, IGM, IGA
Anticardiolipin IgA: >65 APL-U/mL — ABNORMAL HIGH
Anticardiolipin IgG: 104.6 GPL-U/mL — ABNORMAL HIGH
Anticardiolipin IgM: 15.6 MPL-U/mL

## 2020-03-02 LAB — C3 AND C4
C3 Complement: 108 mg/dL (ref 83–193)
C4 Complement: 16 mg/dL (ref 15–57)

## 2020-03-02 LAB — ANTI-NUCLEAR AB-TITER (ANA TITER): ANA Titer 1: 1:80 {titer} — ABNORMAL HIGH

## 2020-03-02 LAB — SEDIMENTATION RATE: Sed Rate: 2 mm/h (ref 0–30)

## 2020-03-02 LAB — ANA: Anti Nuclear Antibody (ANA): POSITIVE — AB

## 2020-03-03 NOTE — Progress Notes (Signed)
Anticardiolipin IgA and IgG positive and have trended up.  Discussed with Dr. Estanislado Pandy.  Continue aspirin.  Recheck anticardiolipin antibodies in 3 months.

## 2020-03-03 NOTE — Progress Notes (Signed)
ANA remains positive.  dsDNA is negative.

## 2020-03-04 ENCOUNTER — Telehealth: Payer: Self-pay | Admitting: *Deleted

## 2020-03-04 DIAGNOSIS — M359 Systemic involvement of connective tissue, unspecified: Secondary | ICD-10-CM

## 2020-03-04 NOTE — Telephone Encounter (Signed)
-----   Message from Ofilia Neas, PA-C sent at 03/03/2020  5:03 PM EST ----- Anticardiolipin IgA and IgG positive and have trended up.  Discussed with Dr. Estanislado Pandy.  Continue aspirin.  Recheck anticardiolipin antibodies in 3 months.

## 2020-03-07 ENCOUNTER — Telehealth: Payer: Self-pay

## 2020-03-07 DIAGNOSIS — M359 Systemic involvement of connective tissue, unspecified: Secondary | ICD-10-CM

## 2020-03-07 MED ORDER — HYDROXYCHLOROQUINE SULFATE 200 MG PO TABS: 200.0000 mg | ORAL_TABLET | ORAL | 0 refills | Status: DC

## 2020-03-07 NOTE — Telephone Encounter (Signed)
Please see question below in regards to cardiolipin antibodies lab results. Also, I will clarify with patient about plaquenil dose. At the appointment on 02/29/2020, patient stated she was taking plaquenil every other day.

## 2020-03-07 NOTE — Telephone Encounter (Signed)
Patient called stating she saw on Mychart that her Cardiolipin antibodies labwork was elevated.  Patient requested a return call to let her know if it could be elevated since she is taking her Plaquenil every other day instead of every day.  Patient states she also noticed on her Mychart that her medication list is incorrect.  Patient states it states she is to take Plaquenil daily instead of every other day.

## 2020-03-07 NOTE — Telephone Encounter (Signed)
Advised patient Dr. Estanislado Pandy and I were unsure if the elevation of the anticardiolipin antibodies was a lab error or a true elevation, which is why we wanted to recheck the anticardiolipin antibodies in 1 month.  order is in place. Patient verbalized understanding.  also advised patient we can see "taking differently" on our view with the correct dose. I will update prescription as no print to reflect correct dose. Patient verbalized understanding.

## 2020-03-07 NOTE — Telephone Encounter (Signed)
Dr. Estanislado Pandy and I were unsure if the elevation of the anticardiolipin antibodies was a lab error or a true elevation, which is why we wanted to recheck the anticardiolipin antibodies in 1 month.

## 2020-03-08 ENCOUNTER — Telehealth: Payer: Self-pay | Admitting: *Deleted

## 2020-03-08 NOTE — Telephone Encounter (Signed)
Received DEXA results from [facility].  Date of Scan: 08/10/2019 Lowest T-score and lowest site measured: -2.3  Left femoral hip Significant changes in BMD and site measured (5% and above):-6% left femur neck  Current Regimen: Calcium and Vitamin D daily  Recommendation: Calcium and Vitamin D daily, resistive exercises  Reviewed by:Hazel Sams, PA-C and Dr. Estanislado Pandy  Next Appointment: 07/25/2020

## 2020-03-10 DIAGNOSIS — E785 Hyperlipidemia, unspecified: Secondary | ICD-10-CM | POA: Diagnosis not present

## 2020-03-10 DIAGNOSIS — F061 Catatonic disorder due to known physiological condition: Secondary | ICD-10-CM | POA: Diagnosis not present

## 2020-03-10 DIAGNOSIS — F329 Major depressive disorder, single episode, unspecified: Secondary | ICD-10-CM | POA: Diagnosis not present

## 2020-03-10 DIAGNOSIS — E7800 Pure hypercholesterolemia, unspecified: Secondary | ICD-10-CM | POA: Diagnosis not present

## 2020-03-25 DIAGNOSIS — F411 Generalized anxiety disorder: Secondary | ICD-10-CM | POA: Diagnosis not present

## 2020-03-25 DIAGNOSIS — F061 Catatonic disorder due to known physiological condition: Secondary | ICD-10-CM | POA: Diagnosis not present

## 2020-03-25 DIAGNOSIS — F339 Major depressive disorder, recurrent, unspecified: Secondary | ICD-10-CM | POA: Diagnosis not present

## 2020-03-25 DIAGNOSIS — F5101 Primary insomnia: Secondary | ICD-10-CM | POA: Diagnosis not present

## 2020-04-05 ENCOUNTER — Ambulatory Visit (INDEPENDENT_AMBULATORY_CARE_PROVIDER_SITE_OTHER): Payer: PPO | Admitting: Psychology

## 2020-04-05 DIAGNOSIS — F332 Major depressive disorder, recurrent severe without psychotic features: Secondary | ICD-10-CM

## 2020-04-07 ENCOUNTER — Other Ambulatory Visit: Payer: Self-pay

## 2020-04-07 DIAGNOSIS — M359 Systemic involvement of connective tissue, unspecified: Secondary | ICD-10-CM | POA: Diagnosis not present

## 2020-04-08 ENCOUNTER — Other Ambulatory Visit: Payer: Self-pay

## 2020-04-08 DIAGNOSIS — R760 Raised antibody titer: Secondary | ICD-10-CM

## 2020-04-08 LAB — CARDIOLIPIN ANTIBODIES, IGG, IGM, IGA
Anticardiolipin IgA: >65 APL-U/mL — ABNORMAL HIGH
Anticardiolipin IgG: >112 GPL-U/mL — ABNORMAL HIGH
Anticardiolipin IgM: 17.9 MPL-U/mL

## 2020-04-14 DIAGNOSIS — Z79899 Other long term (current) drug therapy: Secondary | ICD-10-CM | POA: Diagnosis not present

## 2020-04-14 DIAGNOSIS — H40013 Open angle with borderline findings, low risk, bilateral: Secondary | ICD-10-CM | POA: Diagnosis not present

## 2020-04-14 DIAGNOSIS — H26103 Unspecified traumatic cataract, bilateral: Secondary | ICD-10-CM | POA: Diagnosis not present

## 2020-04-14 DIAGNOSIS — H524 Presbyopia: Secondary | ICD-10-CM | POA: Diagnosis not present

## 2020-04-26 DIAGNOSIS — Z1231 Encounter for screening mammogram for malignant neoplasm of breast: Secondary | ICD-10-CM | POA: Diagnosis not present

## 2020-04-26 DIAGNOSIS — F339 Major depressive disorder, recurrent, unspecified: Secondary | ICD-10-CM | POA: Diagnosis not present

## 2020-04-26 DIAGNOSIS — F5101 Primary insomnia: Secondary | ICD-10-CM | POA: Diagnosis not present

## 2020-04-26 DIAGNOSIS — F411 Generalized anxiety disorder: Secondary | ICD-10-CM | POA: Diagnosis not present

## 2020-04-26 DIAGNOSIS — F061 Catatonic disorder due to known physiological condition: Secondary | ICD-10-CM | POA: Diagnosis not present

## 2020-04-27 DIAGNOSIS — F061 Catatonic disorder due to known physiological condition: Secondary | ICD-10-CM | POA: Diagnosis not present

## 2020-04-27 DIAGNOSIS — E7800 Pure hypercholesterolemia, unspecified: Secondary | ICD-10-CM | POA: Diagnosis not present

## 2020-04-27 DIAGNOSIS — E785 Hyperlipidemia, unspecified: Secondary | ICD-10-CM | POA: Diagnosis not present

## 2020-04-27 DIAGNOSIS — F3341 Major depressive disorder, recurrent, in partial remission: Secondary | ICD-10-CM | POA: Diagnosis not present

## 2020-05-02 ENCOUNTER — Ambulatory Visit (INDEPENDENT_AMBULATORY_CARE_PROVIDER_SITE_OTHER): Payer: PPO | Admitting: Psychology

## 2020-05-02 DIAGNOSIS — F332 Major depressive disorder, recurrent severe without psychotic features: Secondary | ICD-10-CM

## 2020-05-05 ENCOUNTER — Other Ambulatory Visit: Payer: Self-pay

## 2020-05-05 ENCOUNTER — Inpatient Hospital Stay: Payer: PPO | Attending: Hematology & Oncology | Admitting: Hematology & Oncology

## 2020-05-05 ENCOUNTER — Encounter: Payer: Self-pay | Admitting: Hematology & Oncology

## 2020-05-05 ENCOUNTER — Inpatient Hospital Stay: Payer: PPO

## 2020-05-05 ENCOUNTER — Telehealth: Payer: Self-pay

## 2020-05-05 VITALS — BP 127/76 | HR 104 | Temp 98.4°F | Resp 20 | Ht 65.0 in | Wt 153.1 lb

## 2020-05-05 DIAGNOSIS — Z808 Family history of malignant neoplasm of other organs or systems: Secondary | ICD-10-CM | POA: Diagnosis not present

## 2020-05-05 DIAGNOSIS — Z7982 Long term (current) use of aspirin: Secondary | ICD-10-CM

## 2020-05-05 DIAGNOSIS — Z8349 Family history of other endocrine, nutritional and metabolic diseases: Secondary | ICD-10-CM | POA: Diagnosis not present

## 2020-05-05 DIAGNOSIS — Z823 Family history of stroke: Secondary | ICD-10-CM | POA: Diagnosis not present

## 2020-05-05 DIAGNOSIS — Z8262 Family history of osteoporosis: Secondary | ICD-10-CM

## 2020-05-05 DIAGNOSIS — Z83511 Family history of glaucoma: Secondary | ICD-10-CM | POA: Diagnosis not present

## 2020-05-05 DIAGNOSIS — F32A Depression, unspecified: Secondary | ICD-10-CM | POA: Diagnosis not present

## 2020-05-05 DIAGNOSIS — Z79899 Other long term (current) drug therapy: Secondary | ICD-10-CM | POA: Insufficient documentation

## 2020-05-05 DIAGNOSIS — R768 Other specified abnormal immunological findings in serum: Secondary | ICD-10-CM | POA: Diagnosis not present

## 2020-05-05 DIAGNOSIS — R52 Pain, unspecified: Secondary | ICD-10-CM

## 2020-05-05 DIAGNOSIS — Z803 Family history of malignant neoplasm of breast: Secondary | ICD-10-CM | POA: Diagnosis not present

## 2020-05-05 DIAGNOSIS — Z8249 Family history of ischemic heart disease and other diseases of the circulatory system: Secondary | ICD-10-CM

## 2020-05-05 NOTE — Telephone Encounter (Signed)
appts made and calendar printed per 05/05/20 los  [employer]

## 2020-05-05 NOTE — Progress Notes (Signed)
Referral MD  Reason for Referral: Elevated cardiolipin antibodies --no criteria for antiphospholipid antibody syndrome  Chief Complaint  Patient presents with  . New Patient (Initial Visit)    Anticardiolipids, IgA elevated.  : I have elevated cardiolipin antibodies.  HPI: Brianna Ross is a very nice 65 year old [place] female.  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  It was nice talking to her about this.  She is retired now.  She is followed by rheumatology.  She has had a battery of rheumatologic tests.  She was having some pain in her left arm.  Again, the rheumatologic tests were somewhat positive.  She had a positive ANA as mentioned previously.  I am not sure as to why anticardiolipin antibody tests were done.  She has never had a problem with blood clots.  She never had miscarriages.  She does not have any skin rashes.  There is concern that she is had elevation of her anticardiolipin antibodies.  In April of this year, and IgG anticardiolipin antibody was 112.  IgA level was 65.  Again, she has never had a thromboembolic event.  She has had no cardiovascular or cerebrovascular issues.  She has been on Plaquenil.  Again I am not sure why she was placed on Plaquenil unless it was felt that she had some underlying rheumatologic condition.  She was referred to the [facility] because of this.  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  Again, she has had no issues with respect to thromboembolic disease.  There has been no weight loss or weight gain.  There is been no swollen lymph nodes.  She has had no issues with tobacco use.  She really does not drink alcohol.  There I think is a history of thromboembolic disease in the family.  She has had no change in bowel or bladder habits.  She is up-to-date with her colonoscopy.  She is up-to-date with her mammograms.  Overall, I would have to say  that her performance status is ECOG 1.    Past Medical History:  Diagnosis Date  . Abnormal stress electrocardiogram test   . Allergy   . ANA positive   . Atypical nevi   . Depression   . Headache(784.0)   . Hives 2011   x5  . Hyperlipidemia   . Menopause   . OSA (obstructive sleep apnea)   . Osteoarthritis   . Right wrist fracture   . Rosacea   . Shingles 03/2012  . Shortness of breath   . Sleep apnea   . Urticaria   :  Past Surgical History:  Procedure Laterality Date  . ORIF WRIST FRACTURE    . thymic cyst     removal  . WISDOM TOOTH EXTRACTION    :   Current Outpatient Medications:  .  aspirin EC 81 MG tablet, Take 81 mg by mouth daily. , Disp: , Rfl:  .  buPROPion (WELLBUTRIN XL) 150 MG 24 hr tablet, Take 300 mg by mouth daily., Disp: , Rfl:  .  Calcium 500 MG tablet, Take 2 tablets by mouth daily., Disp: , Rfl:  .  Cholecalciferol (VITAMIN D) 2000 units tablet, Take 4,000 Units by mouth daily. , Disp: , Rfl:  .  clonazePAM (KLONOPIN) 0.5 MG tablet, Take 1 mg by mouth at bedtime. , Disp: , Rfl:  .  diclofenac sodium (VOLTAREN) 1 %  GEL, Apply 3 g to 3 large joints up to 3 times daily., Disp: 3 Tube, Rfl: 3 .  fluorometholone (FML) 0.1 % ophthalmic suspension, Place 1 drop into the left eye daily., Disp: , Rfl: 5 .  hydroxychloroquine (PLAQUENIL) 200 MG tablet, Take 1 tablet (200 mg total) by mouth every other day., Disp: 45 tablet, Rfl: 0 .  NONFORMULARY OR COMPOUNDED ITEM, daily. Rosacea cream, Disp: , Rfl:  .  OLANZapine (ZYPREXA) 10 MG tablet, Take 10 mg by mouth daily., Disp: , Rfl:  .  rosuvastatin (CRESTOR) 20 MG tablet, Take 20 mg by mouth daily., Disp: , Rfl:  .  valACYclovir (VALTREX) 1000 MG tablet, Take 1,000 mg by mouth daily., Disp: , Rfl:  .  venlafaxine XR (EFFEXOR-XR) 150 MG 24 hr capsule, Take 300 mg by mouth daily with breakfast., Disp: , Rfl:  .  acetaminophen (TYLENOL) 500 MG tablet, Take 500 mg by mouth 2 (two) times daily as needed (pain).  (Patient not taking: Reported on 05/05/2020), Disp: , Rfl:  .  venlafaxine XR (EFFEXOR-XR) 150 MG 24 hr capsule, Take 2 capsules (300 mg total) by mouth daily., Disp: 180 capsule, Rfl: 1:  :  No Known Allergies:  Family History  Problem Relation Age of Onset  . Glaucoma Mother   . Hyperlipidemia Mother   . Depression Mother   . Osteoporosis Mother   . Breast cancer Mother   . Heart disease Father   . Hyperlipidemia Father   . Hypertension Father   . Heart defect Sister   . Heart disease Sister 13       congenital heart defect,deceased  . Hyperlipidemia Brother   . Depression Brother   . High Cholesterol Brother   . Cancer Paternal Grandmother   . Stroke Paternal Grandfather   . Heart disease Paternal Grandfather   :  Social History   Socioeconomic History  . Marital status: Married    Spouse name: Sonny  . Number of children: 0  . Years of education: 74  . Highest education level: Not on file  Occupational History    Employer: [employer]  Tobacco Use  . Smoking status: Never Smoker  . Smokeless tobacco: Never Used  Vaping Use  . Vaping Use: Never used  Substance and Sexual Activity  . Alcohol use: No  . Drug use: Never  . Sexual activity: Not Currently    Birth control/protection: Post-menopausal  Other Topics Concern  . Not on file  Social History Narrative   Patient works for [employer].(full-time_    Patient lives at home with her husband [name]).   Patient does not have any children.   Patient is right-handed.   Patient drinks one cup of coffee everyday and two cups of tea daily.   Social Determinants of Health   Financial Resource Strain: Not on file  Food Insecurity: Not on file  Transportation Needs: Not on file  Physical Activity: Not on file  Stress: Not on file  Social Connections: Not on file  Intimate Partner Violence: Not on file  :  Review of Systems  Constitutional: Negative.   HENT: Negative.    Eyes: Negative.   Respiratory: Negative.   Cardiovascular: Negative.   Gastrointestinal: Negative.   Genitourinary: Negative.   Musculoskeletal: Negative.   Skin: Negative.   Neurological: Negative.   Endo/Heme/Allergies: Negative.   Psychiatric/Behavioral: Positive for depression.     Exam:  This is a well-developed well-nourished [place] female in no obvious distress.  Vital  signs show temperature of 98.4.  Pulse 82.  Blood pressure 127/70.  Weight is 153 pounds.  Head and neck exam shows no ocular or oral lesions.  She has no palpable cervical or supraclavicular lymph nodes.  Lungs are clear to percussion and auscultation bilaterally.  Cardiac exam regular rate and rhythm with normal S1 and S2.  There are no murmurs, rubs or bruits.  Abdomen is soft.  She has no fluid wave.  There is no palpable abdominal mass.  There is no palpable liver or spleen tip.  Back exam shows no tenderness over the spine, ribs or hips.  Extremities shows no clubbing, cyanosis or edema.  She has good range of motion of her joints.  There is no venous cord in her legs.  She has negative Homans' sign.  Skin exam shows no rashes, ecchymoses or petechia.  Neurological exam shows no focal neurological deficits.   @IPVITALS @   No results for input(s): WBC, HGB, HCT, PLT in the last 72 hours. No results for input(s): NA, K, CL, CO2, GLUCOSE, BUN, CREATININE, CALCIUM in the last 72 hours.  Blood smear review: None  Pathology: None    Assessment and Plan: Ms. Taha is a very charming 65 year old Man female.  She has an elevated anticardiolipin antibody.  She has nothing that meets criteria for antiphospholipid antibody syndrome.  It is clear that she has some autoimmune issue.  I am sure she has a rheumatologic problem.  I am not sure she has underlying lupus.  I cannot imagine that the rheumatologist was treating the cardiolipin antibodies with Plaquenil.  I forgot to mention she is on baby aspirin.  I  think this would be reasonable.  I just do not see any indication or there is no study that shows that treating the cardiolipin antibodies without any symptoms is warranted.  Again I do believe this cardiolipin antibody elevation is all reflective of an underlying rheumatologic condition that she has.  For now, I think we can just watch her.  I think it would be reasonable just to get her back in another couple months or so.  I would check her cardiolipin antibodies at that time.  It was very nice meeting Ms. Forrey.  She is very nice.  She is very charming.  She is a retired Marine scientist.  She served our patients incredibly well.

## 2020-05-12 DIAGNOSIS — G4733 Obstructive sleep apnea (adult) (pediatric): Secondary | ICD-10-CM | POA: Diagnosis not present

## 2020-05-26 DIAGNOSIS — F411 Generalized anxiety disorder: Secondary | ICD-10-CM | POA: Diagnosis not present

## 2020-05-26 DIAGNOSIS — F061 Catatonic disorder due to known physiological condition: Secondary | ICD-10-CM | POA: Diagnosis not present

## 2020-05-26 DIAGNOSIS — F5101 Primary insomnia: Secondary | ICD-10-CM | POA: Diagnosis not present

## 2020-05-26 DIAGNOSIS — F339 Major depressive disorder, recurrent, unspecified: Secondary | ICD-10-CM | POA: Diagnosis not present

## 2020-06-03 DIAGNOSIS — G4733 Obstructive sleep apnea (adult) (pediatric): Secondary | ICD-10-CM | POA: Diagnosis not present

## 2020-06-10 DIAGNOSIS — F3341 Major depressive disorder, recurrent, in partial remission: Secondary | ICD-10-CM | POA: Diagnosis not present

## 2020-06-10 DIAGNOSIS — F061 Catatonic disorder due to known physiological condition: Secondary | ICD-10-CM | POA: Diagnosis not present

## 2020-06-10 DIAGNOSIS — E785 Hyperlipidemia, unspecified: Secondary | ICD-10-CM | POA: Diagnosis not present

## 2020-06-10 DIAGNOSIS — E7800 Pure hypercholesterolemia, unspecified: Secondary | ICD-10-CM | POA: Diagnosis not present

## 2020-06-13 ENCOUNTER — Other Ambulatory Visit: Payer: Self-pay | Admitting: Physician Assistant

## 2020-06-13 DIAGNOSIS — M359 Systemic involvement of connective tissue, unspecified: Secondary | ICD-10-CM

## 2020-06-13 NOTE — Telephone Encounter (Signed)
Last Visit: 02/29/2020 Next Visit: 07/25/2020 Labs: 02/29/2020, CBC and CMP WNL.  ESR and complements WNL.  UA normal. Anticardiolipin IgA and IgG positive and have trended up.  Discussed with Dr.Deveshwar.  Continue aspirin.  Recheck anticardiolipin antibodies in 3 months. ANA remains positive.  dsDNA is negative. I called patient to have labs repeated. Eye exam: 04/14/2020   Current Dose per office note 02/29/2020: Plaquenil 200 mg 1 tablet by mouth every other day  AC:QPEAKLTYVD disease (HCC)positive ANA, hypocomplementemia, positive beta 2 and positive anticardiolipin antibodies  Last Fill: 03/07/2020  Okay to refill Plaquenil?

## 2020-06-14 ENCOUNTER — Other Ambulatory Visit: Payer: Self-pay | Admitting: *Deleted

## 2020-06-14 DIAGNOSIS — M359 Systemic involvement of connective tissue, unspecified: Secondary | ICD-10-CM

## 2020-06-14 DIAGNOSIS — Z79899 Other long term (current) drug therapy: Secondary | ICD-10-CM

## 2020-06-14 DIAGNOSIS — M3500 Sicca syndrome, unspecified: Secondary | ICD-10-CM | POA: Diagnosis not present

## 2020-06-16 ENCOUNTER — Telehealth: Payer: Self-pay | Admitting: Rheumatology

## 2020-06-16 DIAGNOSIS — M85859 Other specified disorders of bone density and structure, unspecified thigh: Secondary | ICD-10-CM | POA: Diagnosis not present

## 2020-06-16 DIAGNOSIS — L659 Nonscarring hair loss, unspecified: Secondary | ICD-10-CM | POA: Diagnosis not present

## 2020-06-16 DIAGNOSIS — Z0000 Encounter for general adult medical examination without abnormal findings: Secondary | ICD-10-CM | POA: Diagnosis not present

## 2020-06-16 DIAGNOSIS — Z1389 Encounter for screening for other disorder: Secondary | ICD-10-CM | POA: Diagnosis not present

## 2020-06-16 DIAGNOSIS — R768 Other specified abnormal immunological findings in serum: Secondary | ICD-10-CM | POA: Diagnosis not present

## 2020-06-16 DIAGNOSIS — Z7189 Other specified counseling: Secondary | ICD-10-CM | POA: Diagnosis not present

## 2020-06-16 DIAGNOSIS — F3341 Major depressive disorder, recurrent, in partial remission: Secondary | ICD-10-CM | POA: Diagnosis not present

## 2020-06-16 DIAGNOSIS — E559 Vitamin D deficiency, unspecified: Secondary | ICD-10-CM | POA: Diagnosis not present

## 2020-06-16 DIAGNOSIS — E785 Hyperlipidemia, unspecified: Secondary | ICD-10-CM | POA: Diagnosis not present

## 2020-06-16 LAB — COMPLETE METABOLIC PANEL WITH GFR
AG Ratio: 2.3 (calc) (ref 1.0–2.5)
ALT: 21 U/L (ref 6–29)
AST: 18 U/L (ref 10–35)
Albumin: 4.5 g/dL (ref 3.6–5.1)
Alkaline phosphatase (APISO): 49 U/L (ref 37–153)
BUN: 11 mg/dL (ref 7–25)
CO2: 29 mmol/L (ref 20–32)
Calcium: 10.2 mg/dL (ref 8.6–10.4)
Chloride: 105 mmol/L (ref 98–110)
Creat: 0.87 mg/dL (ref 0.50–0.99)
GFR, Est African American: 82 mL/min/{1.73_m2} (ref >=60)
GFR, Est Non African American: 71 mL/min/{1.73_m2} (ref >=60)
Globulin: 2 g/dL (calc) (ref 1.9–3.7)
Glucose, Bld: 75 mg/dL (ref 65–99)
Potassium: 4.3 mmol/L (ref 3.5–5.3)
Sodium: 142 mmol/L (ref 135–146)
Total Bilirubin: 0.5 mg/dL (ref 0.2–1.2)
Total Protein: 6.5 g/dL (ref 6.1–8.1)

## 2020-06-16 LAB — URINALYSIS, ROUTINE W REFLEX MICROSCOPIC
Bilirubin Urine: NEGATIVE
Glucose, UA: NEGATIVE
Hgb urine dipstick: NEGATIVE
Ketones, ur: NEGATIVE
Leukocytes,Ua: NEGATIVE
Nitrite: NEGATIVE
Protein, ur: NEGATIVE
Specific Gravity, Urine: 1.004 (ref 1.001–1.035)
pH: 7.5 (ref 5.0–8.0)

## 2020-06-16 LAB — CBC WITH DIFFERENTIAL/PLATELET
Absolute Monocytes: 536 cells/uL (ref 200–950)
Basophils Absolute: 38 cells/uL (ref 0–200)
Basophils Relative: 0.8 %
Eosinophils Absolute: 127 cells/uL (ref 15–500)
Eosinophils Relative: 2.7 %
HCT: 42.4 % (ref 35.0–45.0)
Hemoglobin: 13.9 g/dL (ref 11.7–15.5)
Lymphs Abs: 1213 cells/uL (ref 850–3900)
MCH: 30.2 pg (ref 27.0–33.0)
MCHC: 32.8 g/dL (ref 32.0–36.0)
MCV: 92 fL (ref 80.0–100.0)
MPV: 10.1 fL (ref 7.5–12.5)
Monocytes Relative: 11.4 %
Neutro Abs: 2787 cells/uL (ref 1500–7800)
Neutrophils Relative %: 59.3 %
Platelets: 239 10*3/uL (ref 140–400)
RBC: 4.61 10*6/uL (ref 3.80–5.10)
RDW: 12.5 % (ref 11.0–15.0)
Total Lymphocyte: 25.8 %
WBC: 4.7 10*3/uL (ref 3.8–10.8)

## 2020-06-16 LAB — C3 AND C4
C3 Complement: 114 mg/dL (ref 83–193)
C4 Complement: 17 mg/dL (ref 15–57)

## 2020-06-16 LAB — ANA: Anti Nuclear Antibody (ANA): POSITIVE — AB

## 2020-06-16 LAB — ANTI-NUCLEAR AB-TITER (ANA TITER): ANA Titer 1: 1:80 {titer} — ABNORMAL HIGH

## 2020-06-16 LAB — SEDIMENTATION RATE: Sed Rate: 2 mm/h (ref 0–30)

## 2020-06-16 LAB — ANTI-DNA ANTIBODY, DOUBLE-STRANDED: ds DNA Ab: 1 IU/mL

## 2020-06-16 NOTE — Telephone Encounter (Signed)
Patient calling because she has an appointment for a physical with her PCP, and would like lab results faxed to their office, if possible. Dr. Quentin Cornwall office. Fax# [phone]

## 2020-06-16 NOTE — Telephone Encounter (Signed)
Labs faxed to PCP.  Left message to advise patient labs have been faxed to PCP.

## 2020-06-23 DIAGNOSIS — F331 Major depressive disorder, recurrent, moderate: Secondary | ICD-10-CM | POA: Diagnosis not present

## 2020-06-23 DIAGNOSIS — F411 Generalized anxiety disorder: Secondary | ICD-10-CM | POA: Diagnosis not present

## 2020-06-24 DIAGNOSIS — D225 Melanocytic nevi of trunk: Secondary | ICD-10-CM | POA: Diagnosis not present

## 2020-06-24 DIAGNOSIS — L578 Other skin changes due to chronic exposure to nonionizing radiation: Secondary | ICD-10-CM | POA: Diagnosis not present

## 2020-06-24 DIAGNOSIS — Z86018 Personal history of other benign neoplasm: Secondary | ICD-10-CM | POA: Diagnosis not present

## 2020-06-24 DIAGNOSIS — S60459A Superficial foreign body of unspecified finger, initial encounter: Secondary | ICD-10-CM | POA: Diagnosis not present

## 2020-06-24 DIAGNOSIS — L719 Rosacea, unspecified: Secondary | ICD-10-CM | POA: Diagnosis not present

## 2020-06-24 DIAGNOSIS — D2271 Melanocytic nevi of right lower limb, including hip: Secondary | ICD-10-CM | POA: Diagnosis not present

## 2020-06-24 DIAGNOSIS — D2272 Melanocytic nevi of left lower limb, including hip: Secondary | ICD-10-CM | POA: Diagnosis not present

## 2020-06-24 DIAGNOSIS — L821 Other seborrheic keratosis: Secondary | ICD-10-CM | POA: Diagnosis not present

## 2020-06-24 DIAGNOSIS — Z85828 Personal history of other malignant neoplasm of skin: Secondary | ICD-10-CM | POA: Diagnosis not present

## 2020-06-27 DIAGNOSIS — F333 Major depressive disorder, recurrent, severe with psychotic symptoms: Secondary | ICD-10-CM | POA: Diagnosis not present

## 2020-06-27 DIAGNOSIS — F5101 Primary insomnia: Secondary | ICD-10-CM | POA: Diagnosis not present

## 2020-06-27 DIAGNOSIS — F061 Catatonic disorder due to known physiological condition: Secondary | ICD-10-CM | POA: Diagnosis not present

## 2020-06-27 DIAGNOSIS — Z79899 Other long term (current) drug therapy: Secondary | ICD-10-CM | POA: Diagnosis not present

## 2020-06-27 DIAGNOSIS — F411 Generalized anxiety disorder: Secondary | ICD-10-CM | POA: Diagnosis not present

## 2020-07-11 DIAGNOSIS — F331 Major depressive disorder, recurrent, moderate: Secondary | ICD-10-CM | POA: Diagnosis not present

## 2020-07-11 DIAGNOSIS — F411 Generalized anxiety disorder: Secondary | ICD-10-CM | POA: Diagnosis not present

## 2020-07-11 NOTE — Progress Notes (Signed)
Office Visit Note  Patient: Brianna Ross             Date of Birth: [birth year]           MRN: [id number]             PCP: Leeroy Cha, MD Referring: Leeroy Cha,* Visit Date: 07/25/2020 Occupation: @GUAROCC @  Subjective:  Medication management.   History of Present Illness: Ranata Laughery is a 65 y.o. female with a history of autoimmune disease.  She has been taking hydroxychloroquine 1 tablet every other day.  She denies any history of oral ulcers, nasal ulcers, sicca symptoms, Raynaud's, malar rash, photosensitivity, lymphadenopathy or joint inflammation.  She states that osteoarthritis is manageable and is not causing any discomfort.  Activities of Daily Living:  Patient reports morning stiffness for 0 minutes.   Patient Denies nocturnal pain.  Difficulty dressing/grooming: Denies Difficulty climbing stairs: Denies Difficulty getting out of chair: Denies Difficulty using hands for taps, buttons, cutlery, and/or writing: Denies  Review of Systems  Constitutional:  Negative for fatigue.  HENT:  Negative for mouth sores, mouth dryness and nose dryness.   Eyes:  Negative for pain, itching and dryness.  Respiratory:  Negative for shortness of breath and difficulty breathing.   Cardiovascular:  Negative for chest pain and palpitations.  Gastrointestinal:  Positive for constipation. Negative for blood in stool and diarrhea.  Endocrine: Negative for increased urination.  Genitourinary:  Negative for difficulty urinating.  Musculoskeletal:  Negative for joint pain, joint pain, joint swelling, myalgias, morning stiffness, muscle tenderness and myalgias.  Skin:  Negative for color change, rash, redness and sensitivity to sunlight.  Allergic/Immunologic: Negative for susceptible to infections.  Neurological:  Negative for dizziness, numbness, headaches, memory loss and weakness.  Hematological:  Negative for bruising/bleeding tendency and swollen glands.   Psychiatric/Behavioral:  Positive for depressed mood. Negative for confusion and sleep disturbance. The patient is nervous/anxious.    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   Positive ANA (antinuclear antibody) 03/06/2012   Elevated aldolase level 03/06/2012   Atypical nevi 11/14/2011   Menopause 11/13/2011   Rosacea 11/13/2011    Past Medical History:  Diagnosis Date   Abnormal stress electrocardiogram test    Allergy    ANA positive    Atypical nevi    Depression    Headache(784.0)    Hives 2011   x5   Hyperlipidemia    Menopause    OSA (obstructive sleep apnea)    Osteoarthritis    Right wrist fracture    Rosacea    Shingles 03/2012   Shortness of breath    Sleep apnea    Urticaria     Family History  Problem Relation Age of Onset   Glaucoma Mother    Hyperlipidemia Mother  Depression Mother    Osteoporosis Mother    Breast cancer Mother    Heart disease Father    Hyperlipidemia Father    Hypertension Father    Heart defect Sister    Heart disease Sister 37       congenital heart defect,deceased   Hyperlipidemia Brother    Depression Brother    High Cholesterol Brother    Cancer Paternal Grandmother     Stroke Paternal Grandfather    Heart disease Paternal Grandfather    Past Surgical History:  Procedure Laterality Date   ORIF WRIST FRACTURE     thymic cyst     removal   WISDOM TOOTH EXTRACTION     Social History   Social History Narrative   Patient works for [employer].(full-time_    Patient lives at home with her husband [name]).   Patient does not have any children.   Patient is right-handed.   Patient drinks one cup of coffee everyday and two cups of tea daily.   Immunization History  Administered Date(s) Administered   Influenza Split 11/13/2011   Influenza,inj,Quad PF,6+ Mos 11/20/2016   Influenza-Unspecified 09/11/2013   Moderna Sars-Covid-2 Vaccination 02/25/2019, 03/25/2019, 11/24/2019   Pneumococcal Polysaccharide-23 11/12/2012     Objective: Vital Signs: BP 114/78 (BP Location: Left Arm, Patient Position: Sitting, Cuff Size: Normal)   Pulse 94   Ht 5' 4.5" (1.638 m)   Wt 155 lb 6.4 oz (70.5 kg)   BMI 26.26 kg/m    Physical Exam Vitals and nursing note reviewed.  Constitutional:      Appearance: She is well-developed.  HENT:     Head: Normocephalic and atraumatic.  Eyes:     Conjunctiva/sclera: Conjunctivae normal.  Cardiovascular:     Rate and Rhythm: Normal rate and regular rhythm.     Heart sounds: Normal heart sounds.  Pulmonary:     Effort: Pulmonary effort is normal.     Breath sounds: Normal breath sounds.  Abdominal:     General: Bowel sounds are normal.     Palpations: Abdomen is soft.  Musculoskeletal:     Cervical back: Normal range of motion.  Lymphadenopathy:     Cervical: No cervical adenopathy.  Skin:    General: Skin is warm and dry.     Capillary Refill: Capillary refill takes less than 2 seconds.  Neurological:     Mental Status: She is alert and oriented to person, place, and time.  Psychiatric:        Behavior: Behavior normal.     Musculoskeletal Exam: C-spine was in good range of motion.  Shoulder  joints, elbow joints, wrist joints with good range of motion.  She has mild PIP and DIP thickening with no synovitis.  Hip joints, knee joints with good range of motion.  She has some PIP and DIP prominence in her feet consistent with osteoarthritis.    CDAI Exam: CDAI Score: -- Patient Global: --; Provider Global: -- Swollen: --; Tender: -- Joint Exam 07/25/2020   No joint exam has been documented for this visit   There is currently no information documented on the homunculus. Go to the Rheumatology activity and complete the homunculus joint exam.  Investigation: No additional findings.  Imaging: No results found.  Recent Labs: Lab Results  Component Value Date   WBC 5.4 07/18/2020   HGB 13.1 07/18/2020   PLT 194 07/18/2020   NA 140 07/18/2020   K 3.9 07/18/2020   CL 104 07/18/2020   CO2  30 07/18/2020   GLUCOSE 108 (H) 07/18/2020   BUN 12 07/18/2020   CREATININE 0.83 07/18/2020   BILITOT 0.3 07/18/2020   ALKPHOS 48 07/18/2020   AST 24 07/18/2020   ALT 36 07/18/2020   PROT 6.4 (L) 07/18/2020   ALBUMIN 4.2 07/18/2020   CALCIUM 10.0 07/18/2020   GFRAA 82 06/14/2020    Speciality Comments: PLQ Eye Exam: 04/14/2020 @ [facility] Follow up in 1 year  August 10, 2019 DEXA scan showed BMD 0.592, T score -2.3 left femoral neck percent change -6% compared to August 08, 2017  Procedures:  No procedures performed Allergies: Patient has no known allergies.   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  She is clinically doing well.  She states her sicca symptoms are not prominent.  She has been taking hydroxychloroquine 200 mg p.o. every other day.  Most recent labs from July 18, 2020 showed lower anticardiolipin antibody titers.  Beta-2 GP 1 was 26, lupus anticoagulant was positive, anticardiolipin  IgG 31.  There is no history of DVTs.  I had a detailed discussion with the patient regarding increased risk of clotting with triple antibodies.  I advised her to increase hydroxychloroquine to 200 mg p.o. twice daily Monday to Friday.  She is hesitant to do that.  I will increase hydroxychloroquine to 1 tablet p.o. daily.  She was in agreement.  I will send her a new prescription.  She is on aspirin 81 mg daily.  I also reviewed Dr. Antonieta Pert note from July 18, 2020.  He did not recommend any anticoagulation therapy at this time.  High risk medication use-hydroxychloroquine 200 mg 1tablet every other day.  We will change scription to hydroxychloroquine 200 mg 1 tablet daily.  Her eye examination was April 14, 2020.  We have tapered Plaquenil dose over time.  She is advised to get labs with the follow-up visits as it is inconvenient for her to come prior to the visits.  Recommendations regarding immunization were given.  Sicca syndrome (HCC)-her sicca symptoms are manageable.  Osteopenia of multiple sites - DEXA 08/10/2019 T-score: -2.3, BMD: 0.592 left femoral hip. DEXA scan done on September 11, 2017 showed a T score of -2.3 in the right femoral neck.  DEXA results were discussed with the patient.  She had by a 6% change in her BMD.  I discussed the option of Fosamax which she declined.  Calcium rich diet and vitamin D was discussed.  We will check vitamin D level with the next labs.  Primary osteoarthritis of both hands-she had bilateral PIP and DIP thickening with no synovitis.  Primary osteoarthritis of both knees-she denies any discomfort.  No warmth swelling or effusion was noted.  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  No orders of the defined  types were placed in this encounter.  .  Follow-Up Instructions: Return in about 6 months (around 01/25/2021) for Autoimmune disease.   Bo Merino, MD  Note - This record has been created using Editor, commissioning.  Chart creation errors have been sought, but may not always  have been located. Such creation errors do not reflect on  the standard  of medical care.

## 2020-07-18 ENCOUNTER — Other Ambulatory Visit: Payer: Self-pay

## 2020-07-18 ENCOUNTER — Encounter: Payer: Self-pay | Admitting: Hematology & Oncology

## 2020-07-18 ENCOUNTER — Inpatient Hospital Stay: Payer: PPO | Attending: Hematology & Oncology

## 2020-07-18 ENCOUNTER — Inpatient Hospital Stay: Payer: PPO | Admitting: Hematology & Oncology

## 2020-07-18 VITALS — BP 122/82 | HR 92 | Temp 98.2°F | Resp 20 | Wt 156.4 lb

## 2020-07-18 DIAGNOSIS — R768 Other specified abnormal immunological findings in serum: Secondary | ICD-10-CM | POA: Diagnosis not present

## 2020-07-18 DIAGNOSIS — Z7982 Long term (current) use of aspirin: Secondary | ICD-10-CM | POA: Insufficient documentation

## 2020-07-18 DIAGNOSIS — Z79899 Other long term (current) drug therapy: Secondary | ICD-10-CM | POA: Insufficient documentation

## 2020-07-18 DIAGNOSIS — M359 Systemic involvement of connective tissue, unspecified: Secondary | ICD-10-CM | POA: Diagnosis not present

## 2020-07-18 DIAGNOSIS — M2550 Pain in unspecified joint: Secondary | ICD-10-CM | POA: Diagnosis not present

## 2020-07-18 DIAGNOSIS — F411 Generalized anxiety disorder: Secondary | ICD-10-CM | POA: Diagnosis not present

## 2020-07-18 DIAGNOSIS — F331 Major depressive disorder, recurrent, moderate: Secondary | ICD-10-CM | POA: Diagnosis not present

## 2020-07-18 LAB — CBC WITH DIFFERENTIAL (CANCER CENTER ONLY)
Abs Immature Granulocytes: 0.01 10*3/uL (ref 0.00–0.07)
Basophils Absolute: 0.1 10*3/uL (ref 0.0–0.1)
Basophils Relative: 1 %
Eosinophils Absolute: 0.2 10*3/uL (ref 0.0–0.5)
Eosinophils Relative: 3 %
HCT: 39 % (ref 36.0–46.0)
Hemoglobin: 13.1 g/dL (ref 12.0–15.0)
Immature Granulocytes: 0 %
Lymphocytes Relative: 22 %
Lymphs Abs: 1.2 10*3/uL (ref 0.7–4.0)
MCH: 30.5 pg (ref 26.0–34.0)
MCHC: 33.6 g/dL (ref 30.0–36.0)
MCV: 90.9 fL (ref 80.0–100.0)
Monocytes Absolute: 0.5 10*3/uL (ref 0.1–1.0)
Monocytes Relative: 9 %
Neutro Abs: 3.5 10*3/uL (ref 1.7–7.7)
Neutrophils Relative %: 65 %
Platelet Count: 194 10*3/uL (ref 150–400)
RBC: 4.29 MIL/uL (ref 3.87–5.11)
RDW: 12.2 % (ref 11.5–15.5)
WBC Count: 5.4 10*3/uL (ref 4.0–10.5)
nRBC: 0 % (ref 0.0–0.2)

## 2020-07-18 LAB — CMP (CANCER CENTER ONLY)
ALT: 36 U/L (ref 0–44)
AST: 24 U/L (ref 15–41)
Albumin: 4.2 g/dL (ref 3.5–5.0)
Alkaline Phosphatase: 48 U/L (ref 38–126)
Anion gap: 6 (ref 5–15)
BUN: 12 mg/dL (ref 8–23)
CO2: 30 mmol/L (ref 22–32)
Calcium: 10 mg/dL (ref 8.9–10.3)
Chloride: 104 mmol/L (ref 98–111)
Creatinine: 0.83 mg/dL (ref 0.44–1.00)
GFR, Estimated: >60 mL/min (ref >60)
Glucose, Bld: 108 mg/dL — ABNORMAL HIGH (ref 70–99)
Potassium: 3.9 mmol/L (ref 3.5–5.1)
Sodium: 140 mmol/L (ref 135–145)
Total Bilirubin: 0.3 mg/dL (ref 0.3–1.2)
Total Protein: 6.4 g/dL — ABNORMAL LOW (ref 6.5–8.1)

## 2020-07-18 LAB — SAVE SMEAR(SSMR), FOR PROVIDER SLIDE REVIEW

## 2020-07-18 NOTE — Progress Notes (Signed)
Hematology and Oncology Follow Up Visit  Analys Ryden [id number] November 04, 1955 65 y.o. 07/18/2020   Principle Diagnosis:  Elevated cardiolipin antibodies --no history of thromboembolic disease  Current Therapy:   Aspirin 81 mg p.o. daily     Interim History:  Ms. Tse is back for follow-up.  We first saw her back in May.  At that time, she was referred because she had some elevated cardiolipin antibodies.  Again she never had a history of thromboembolic disease.  She is seen by rheumatology.  She think is on Plaquenil.  When we saw her, she had an elevated IgA anticardiolipin antibody as 65.  Her IgG anticardiolipin antibody was 112.  Again, there is no issue with respect to thromboembolic disease.  She is on aspirin.  She has had no problem with leg pain or swelling.  She has had no nausea or vomiting.  She has had no change in bowel or bladder habits.  There is no cough.  There is no history of COVID exposure.  There is a possible history of thromboembolic disease in the family.  She has had no headache.  Overall, I would say performance status for now is ECOG 1.  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  Medications:  Current Outpatient Medications:    aspirin EC 81 MG tablet, Take 81 mg by mouth daily. , Disp: , Rfl:    buPROPion (WELLBUTRIN XL) 150 MG 24 hr tablet, Take 450 mg by mouth daily., Disp: , Rfl:    calcium carbonate (OS-CAL) 600 MG tablet, Take 600 mg by mouth in the morning and at bedtime., Disp: , Rfl:    Cholecalciferol (VITAMIN D) 2000 units tablet, Take 4,000 Units by mouth daily. , Disp: , Rfl:    clonazePAM (KLONOPIN) 0.5 MG tablet, Take 1 mg by mouth at bedtime. , Disp: , Rfl:    fluorometholone (FML) 0.1 % ophthalmic suspension, Place 1 drop into the left eye daily., Disp: , Rfl: 5   hydroxychloroquine (PLAQUENIL) 200 MG tablet, Take 1 tablet by mouth every other day., Disp: 45 tablet, Rfl: 0   NONFORMULARY OR COMPOUNDED ITEM, daily. Rosacea cream,  Disp: , Rfl:    OLANZapine (ZYPREXA) 10 MG tablet, Take 10 mg by mouth daily., Disp: , Rfl:    rosuvastatin (CRESTOR) 20 MG tablet, Take 20 mg by mouth daily., Disp: , Rfl:    valACYclovir (VALTREX) 1000 MG tablet, Take 1,000 mg by mouth daily., Disp: , Rfl:    venlafaxine XR (EFFEXOR-XR) 150 MG 24 hr capsule, Take 300 mg by mouth daily with breakfast., Disp: , Rfl:    acetaminophen (TYLENOL) 500 MG tablet, Take 500 mg by mouth 2 (two) times daily as needed (pain). (Patient not taking: No sig reported), Disp: , Rfl:    diclofenac sodium (VOLTAREN) 1 % GEL, Apply 3 g to 3 large joints up to 3 times daily. (Patient not taking: Reported on 07/18/2020), Disp: 3 Tube, Rfl: 3  Allergies: No Known Allergies  Past Medical History, Surgical history, Social history, and Family History were reviewed and updated.  Review of Systems: Review of Systems  Constitutional: Negative.   HENT:  Negative.    Eyes: Negative.   Respiratory: Negative.    Cardiovascular: Negative.   Gastrointestinal: Negative.   Endocrine: Negative.   Genitourinary: Negative.    Musculoskeletal:  Positive for arthralgias.  Skin: Negative.   Neurological: Negative.   Hematological: Negative.   Psychiatric/Behavioral: Negative.     Physical Exam:  weight is  156 lb 6.4 oz (70.9 kg). Her oral temperature is 98.2 F (36.8 C). Her blood pressure is 122/82 and her pulse is 92. Her respiration is 20 and oxygen saturation is 98%.   Wt Readings from Last 3 Encounters:  07/18/20 156 lb 6.4 oz (70.9 kg)  05/05/20 153 lb 1.3 oz (69.4 kg)  02/29/20 154 lb 12.8 oz (70.2 kg)    Physical Exam Vitals reviewed.  HENT:     Head: Normocephalic and atraumatic.  Eyes:     Pupils: Pupils are equal, round, and reactive to light.  Cardiovascular:     Rate and Rhythm: Normal rate and regular rhythm.     Heart sounds: Normal heart sounds.  Pulmonary:     Effort: Pulmonary effort is normal.     Breath sounds: Normal breath sounds.   Abdominal:     General: Bowel sounds are normal.     Palpations: Abdomen is soft.  Musculoskeletal:        General: No tenderness or deformity. Normal range of motion.     Cervical back: Normal range of motion.  Lymphadenopathy:     Cervical: No cervical adenopathy.  Skin:    General: Skin is warm and dry.     Findings: No erythema or rash.  Neurological:     Mental Status: She is alert and oriented to person, place, and time.  Psychiatric:        Behavior: Behavior normal.        Thought Content: Thought content normal.        Judgment: Judgment normal.     Lab Results  Component Value Date   WBC 5.4 07/18/2020   HGB 13.1 07/18/2020   HCT 39.0 07/18/2020   MCV 90.9 07/18/2020   PLT 194 07/18/2020     Chemistry      Component Value Date/Time   NA 140 07/18/2020 1155   K 3.9 07/18/2020 1155   CL 104 07/18/2020 1155   CO2 30 07/18/2020 1155   BUN 12 07/18/2020 1155   CREATININE 0.83 07/18/2020 1155   CREATININE 0.87 06/14/2020 1426      Component Value Date/Time   CALCIUM 10.0 07/18/2020 1155   ALKPHOS 48 07/18/2020 1155   AST 24 07/18/2020 1155   ALT 36 07/18/2020 1155   BILITOT 0.3 07/18/2020 1155      Impression and Plan: Ms. Wilbanks is a very nice 65 year old [place] female.  She has a rheumatologic issue.  I think she is on Plaquenil for this.  Again, she does have elevated cardiolipin antibodies.  But she has never had a problem with thromboembolic disease.  As such, I just do not see that we have to do anything with her.  She is on baby aspirin.  I  told her that if she were to have any surgery in the future, she must let us know so we could get her on anticoagulation --perioperative  -- so that we can make sure that she does not have any problems with thromboembolism.  Otherwise, I told her to make sure that she stays well-hydrated.  I told her to make sure that she takes her aspirin.  If she feels like she may have developed a thrombus, she can was call  us so that we can see her and order the appropriate test.   Volanda Napoleon, MD 7/18/20224:58 PM

## 2020-07-19 ENCOUNTER — Telehealth: Payer: Self-pay

## 2020-07-19 LAB — CARDIOLIPIN ANTIBODIES, IGG, IGM, IGA
Anticardiolipin IgA: <9 APL U/mL (ref 0–11)
Anticardiolipin IgG: 31 GPL U/mL — ABNORMAL HIGH (ref 0–14)
Anticardiolipin IgM: 16 MPL U/mL — ABNORMAL HIGH (ref 0–12)

## 2020-07-19 LAB — BETA-2-GLYCOPROTEIN I ABS, IGG/M/A
Beta-2 Glyco I IgG: 26 GPI IgG units — ABNORMAL HIGH (ref 0–20)
Beta-2-Glycoprotein I IgA: 23 GPI IgA units (ref 0–25)
Beta-2-Glycoprotein I IgM: 13 GPI IgM units (ref 0–32)

## 2020-07-19 NOTE — Telephone Encounter (Signed)
No 07/18/20 los noted   [employer]

## 2020-07-20 LAB — LUPUS ANTICOAGULANT PANEL
DRVVT: 55.4 s — ABNORMAL HIGH (ref 0.0–47.0)
PTT Lupus Anticoagulant: 43.3 s (ref 0.0–51.9)

## 2020-07-20 LAB — DRVVT CONFIRM: dRVVT Confirm: 1.5 ratio — ABNORMAL HIGH (ref 0.8–1.2)

## 2020-07-20 LAB — DRVVT MIX: dRVVT Mix: 54.3 s — ABNORMAL HIGH (ref 0.0–40.4)

## 2020-07-25 ENCOUNTER — Encounter: Payer: Self-pay | Admitting: Rheumatology

## 2020-07-25 ENCOUNTER — Other Ambulatory Visit: Payer: Self-pay

## 2020-07-25 ENCOUNTER — Ambulatory Visit: Payer: PPO | Admitting: Rheumatology

## 2020-07-25 VITALS — BP 114/78 | HR 94 | Ht 64.5 in | Wt 155.4 lb

## 2020-07-25 DIAGNOSIS — M8589 Other specified disorders of bone density and structure, multiple sites: Secondary | ICD-10-CM

## 2020-07-25 DIAGNOSIS — F322 Major depressive disorder, single episode, severe without psychotic features: Secondary | ICD-10-CM | POA: Diagnosis not present

## 2020-07-25 DIAGNOSIS — M19042 Primary osteoarthritis, left hand: Secondary | ICD-10-CM

## 2020-07-25 DIAGNOSIS — Z8639 Personal history of other endocrine, nutritional and metabolic disease: Secondary | ICD-10-CM | POA: Diagnosis not present

## 2020-07-25 DIAGNOSIS — Z79899 Other long term (current) drug therapy: Secondary | ICD-10-CM

## 2020-07-25 DIAGNOSIS — M3500 Sicca syndrome, unspecified: Secondary | ICD-10-CM

## 2020-07-25 DIAGNOSIS — M170 Bilateral primary osteoarthritis of knee: Secondary | ICD-10-CM

## 2020-07-25 DIAGNOSIS — R760 Raised antibody titer: Secondary | ICD-10-CM

## 2020-07-25 DIAGNOSIS — F061 Catatonic disorder due to known physiological condition: Secondary | ICD-10-CM | POA: Diagnosis not present

## 2020-07-25 DIAGNOSIS — F411 Generalized anxiety disorder: Secondary | ICD-10-CM | POA: Diagnosis not present

## 2020-07-25 DIAGNOSIS — F5101 Primary insomnia: Secondary | ICD-10-CM | POA: Diagnosis not present

## 2020-07-25 DIAGNOSIS — F333 Major depressive disorder, recurrent, severe with psychotic symptoms: Secondary | ICD-10-CM | POA: Diagnosis not present

## 2020-07-25 DIAGNOSIS — Z8619 Personal history of other infectious and parasitic diseases: Secondary | ICD-10-CM

## 2020-07-25 DIAGNOSIS — M359 Systemic involvement of connective tissue, unspecified: Secondary | ICD-10-CM | POA: Diagnosis not present

## 2020-07-25 DIAGNOSIS — Z8669 Personal history of other diseases of the nervous system and sense organs: Secondary | ICD-10-CM

## 2020-07-25 DIAGNOSIS — L719 Rosacea, unspecified: Secondary | ICD-10-CM | POA: Diagnosis not present

## 2020-07-25 DIAGNOSIS — M19041 Primary osteoarthritis, right hand: Secondary | ICD-10-CM | POA: Diagnosis not present

## 2020-07-25 MED ORDER — HYDROXYCHLOROQUINE SULFATE 200 MG PO TABS: ORAL_TABLET | ORAL | 0 refills | Status: DC

## 2020-07-25 NOTE — Patient Instructions (Signed)
Vaccines You are taking a medication(s) that can suppress your immune system.  The following immunizations are recommended: Flu annually Covid-19  Td/Tdap (tetanus, diphtheria, pertussis) every 10 years Pneumonia (Prevnar 15 then Pneumovax 23 at least 1 year apart.  Alternatively, can take Prevnar 20 without needing additional dose) Shingrix (after age 64): 2 doses from 4 weeks to 6 months apart  Please check with your PCP to make sure you are up to date.   Heart Disease Prevention   Your inflammatory disease increases your risk of heart disease which includes heart attack, stroke, atrial fibrillation (irregular heartbeats), high blood pressure, heart failure and atherosclerosis (plaque in the arteries).  It is important to reduce your risk by:   Keep blood pressure, cholesterol, and blood sugar at healthy levels   Smoking Cessation   Maintain a healthy weight  BMI 20-25   Eat a healthy diet  Plenty of fresh fruit, vegetables, and whole grains  Limit saturated fats, foods high in sodium, and added sugars  DASH and Mediterranean diet   Increase physical activity  Recommend moderate physically activity for 150 minutes per week/ 30 minutes a day for five days a week These can be broken up into three separate ten-minute sessions during the day.   Reduce Stress  Meditation, slow breathing exercises, yoga, coloring books  Dental visits twice a year

## 2020-07-25 NOTE — Telephone Encounter (Signed)
Pt brought her card into the office to be read and see how the AHI events were. Looks like she is having significant mask leaks and should follow up with the company about getting a good sealed mask.

## 2020-07-25 NOTE — Telephone Encounter (Signed)
Called the patient to advise the MD reviewed the DL and feels that we should order a BiPAP titration. Since been over 6 mths since we saw her in a visit insurance would require a visit in order to discuss and order. Advised the patient in VM that I had a opening with Ward Givens, NP I have placed on hold 08/11/20 at 10 am. Instructed the pt to call back and let us know if she an take this apt. This can be a VV or in person visit, whichever she prefers.

## 2020-07-27 LAB — ANTINUCLEAR ANTIBODIES, IFA: ANA Ab, IFA: POSITIVE — AB

## 2020-07-27 LAB — FANA STAINING PATTERNS: Homogeneous Pattern: 1

## 2020-07-28 ENCOUNTER — Encounter: Payer: Self-pay | Admitting: *Deleted

## 2020-07-29 ENCOUNTER — Other Ambulatory Visit: Payer: Self-pay

## 2020-07-29 ENCOUNTER — Emergency Department (HOSPITAL_BASED_OUTPATIENT_CLINIC_OR_DEPARTMENT_OTHER): Payer: PPO

## 2020-07-29 ENCOUNTER — Encounter (HOSPITAL_BASED_OUTPATIENT_CLINIC_OR_DEPARTMENT_OTHER): Payer: Self-pay | Admitting: Emergency Medicine

## 2020-07-29 ENCOUNTER — Emergency Department (HOSPITAL_BASED_OUTPATIENT_CLINIC_OR_DEPARTMENT_OTHER)
Admission: EM | Admit: 2020-07-29 | Discharge: 2020-07-29 | Disposition: A | Discharge status: Home / Self Care | Payer: PPO | Attending: Emergency Medicine | Admitting: Emergency Medicine

## 2020-07-29 DIAGNOSIS — W01198A Fall on same level from slipping, tripping and stumbling with subsequent striking against other object, initial encounter: Secondary | ICD-10-CM | POA: Diagnosis not present

## 2020-07-29 DIAGNOSIS — Z7982 Long term (current) use of aspirin: Secondary | ICD-10-CM | POA: Diagnosis not present

## 2020-07-29 DIAGNOSIS — Z79899 Other long term (current) drug therapy: Secondary | ICD-10-CM | POA: Insufficient documentation

## 2020-07-29 DIAGNOSIS — S59911A Unspecified injury of right forearm, initial encounter: Secondary | ICD-10-CM | POA: Diagnosis present

## 2020-07-29 DIAGNOSIS — W010XXA Fall on same level from slipping, tripping and stumbling without subsequent striking against object, initial encounter: Secondary | ICD-10-CM

## 2020-07-29 DIAGNOSIS — S51811A Laceration without foreign body of right forearm, initial encounter: Secondary | ICD-10-CM | POA: Insufficient documentation

## 2020-07-29 MED ORDER — LIDOCAINE-EPINEPHRINE (PF) 2 %-1:200000 IJ SOLN
10.0000 mL | Freq: Once | INTRAMUSCULAR | Status: AC
  Administered 2020-07-29: 10 mL
  Filled 2020-07-29: qty 20

## 2020-07-29 NOTE — Discharge Instructions (Addendum)
Please make sure that you have lights on when you are walking around the house at night.

## 2020-07-29 NOTE — ED Triage Notes (Signed)
Patient states tripped over a picture and has laceration to right forearm.

## 2020-07-29 NOTE — ED Provider Notes (Signed)
[facility] Provider Note   CSN: [id number] Arrival date & time: 07/29/20  0559     History Chief Complaint  Patient presents with   Laceration    Brianna Ross is a 65 y.o. female.  The history is provided by the patient.  Laceration She has history of hyperlipidemia and comes in following a fall at home.  She was returning from the bathroom when she tripped and fell against a picture with a glass covering.  The glass broke and she suffered a laceration to her right forearm.  She denies other injury and specifically denies any head or neck injury.  She denies loss of consciousness.  She is up-to-date on tetanus immunizations.  She is on low-dose aspirin but denies anticoagulant use.   Past Medical History:  Diagnosis Date   Abnormal stress electrocardiogram test    Allergy    ANA positive    Atypical nevi    Depression    Headache(784.0)    Hives 2011   x5   Hyperlipidemia    Menopause    OSA (obstructive sleep apnea)    Osteoarthritis    Right wrist fracture    Rosacea    Shingles 03/2012   Shortness of breath    Sleep apnea    Urticaria     Patient Active Problem List   Diagnosis Date Noted   Major depressive disorder in partial remission ([place]) 123XX123   Complicated grief 123XX123   Primary osteoarthritis of both hands 02/27/2016   High risk medication use 02/20/2016   Autoimmune disease (HCC)positive ANA, hypocomplementemia, positive beta 2 and positive anticardiolipin antibodies.  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     Family History  Problem Relation Age of Onset   Glaucoma Mother    Hyperlipidemia Mother    Depression Mother    Osteoporosis Mother    Breast cancer Mother    Heart disease Father    Hyperlipidemia Father    Hypertension Father    Heart defect Sister    Heart disease Sister 80       congenital heart defect,deceased   Hyperlipidemia Brother    Depression Brother    High Cholesterol Brother    Cancer Paternal Grandmother    Stroke Paternal Grandfather    Heart disease Paternal Grandfather     Social History   Tobacco Use   Smoking status: Never   Smokeless tobacco: Never  Vaping Use   Vaping Use: Never used  Substance Use Topics   Alcohol use: No   Drug use: Never    Home Medications Prior to Admission medications   Medication Sig Start Date End Date Taking? Authorizing Provider  acetaminophen (TYLENOL)  500 MG tablet Take 500 mg by mouth 2 (two) times daily as needed (pain).    [provider]  aspirin EC 81 MG tablet Take 81 mg by mouth daily.     [provider]  buPROPion (WELLBUTRIN XL) 150 MG 24 hr tablet Take 450 mg by mouth daily.    [provider]  calcium carbonate (OS-CAL) 600 MG tablet Take 600 mg by mouth in the morning and at bedtime.    [provider]  Cholecalciferol (VITAMIN D) 2000 units tablet Take 4,000 Units by mouth daily.     [provider]  clonazePAM (KLONOPIN) 0.5 MG tablet Take 1 mg by mouth at bedtime.  04/14/19   [provider]  diclofenac sodium (VOLTAREN) 1 % GEL Apply 3 g to 3 large joints up to 3 times daily. 07/25/17   Ofilia Neas, PA-C   fluorometholone (FML) 0.1 % ophthalmic suspension Place 1 drop into the left eye daily. 08/05/17   [provider]  hydroxychloroquine (PLAQUENIL) 200 MG tablet Take 1 tablet by mouth daily. 07/25/20   Bo Merino, MD  NONFORMULARY OR COMPOUNDED ITEM daily. Rosacea cream    [provider]  OLANZapine (ZYPREXA) 10 MG tablet Take 10 mg by mouth daily. 12/22/19   [provider]  rosuvastatin (CRESTOR) 20 MG tablet Take 20 mg by mouth daily. 08/12/18   [provider]  valACYclovir (VALTREX) 1000 MG tablet Take 1,000 mg by mouth daily.    [provider]  venlafaxine XR (EFFEXOR-XR) 150 MG 24 hr capsule Take 300 mg by mouth daily with breakfast.    [provider]    Allergies    Patient has no known allergies.  Review of Systems   Review of Systems  All other systems reviewed and are negative.  Physical Exam Updated Vital Signs BP 112/76 (BP Location: Left Arm)   Pulse 99   Temp 97.9 F (36.6 C) (Oral)   Resp 16   Ht 5' 4.5" (1.638 m)   Wt 70.5 kg   SpO2 99%   BMI 26.27 kg/m   Physical Exam Vitals and nursing note reviewed.  65 year old female, resting comfortably and in no acute distress. Vital signs are normal. Oxygen saturation is 99%, which is normal. Head is normocephalic and atraumatic. PERRLA, EOMI. Oropharynx is clear. Neck is nontender and supple without adenopathy or JVD. Back is nontender and there is no CVA tenderness. Lungs are clear without rales, wheezes, or rhonchi. Chest is nontender. Heart has regular rate and rhythm without murmur. Abdomen is soft, flat, nontender without masses or hepatosplenomegaly and peristalsis is normoactive. Extremities: Laceration present to the dorsum of the right proximal forearm.  Distal neurovascular exam is intact with strong pulses, prompt capillary refill, normal sensation. Neurologic: Mental status is normal, cranial nerves are intact, there are no motor or sensory  deficits.  ED Results / Procedures / Treatments    Radiology DG Forearm Right  Result Date: 07/29/2020 CLINICAL DATA:  65 year old female status post fall landing on broken glass. Laceration, query foreign body. EXAM: RIGHT FOREARM - 2 VIEW COMPARISON:  Right wrist series 08/05/2014. FINDINGS: Chronic distal radius ORIF appears stable and intact. No right humerus or ulna fracture identified. Alignment appears maintained at the right wrist and elbow. No soft tissue gas. Questionable small indistinct radiopaque foreign bodies, 3-4 mm (arrows) along the dorsal distal forearm. One of these is just distal to the overlying dressing material. No other No radiopaque foreign body  identified. IMPRESSION: 1. Difficult to exclude two small 3-4 mm glass fragments at the dorsal distal forearm (arrows). 2. No acute fracture or dislocation identified about the right forearm. Chronic distal radius ORIF. Electronically Signed   By: Genevie Ann M.D.   On: 07/29/2020 06:42    Procedures .Marland KitchenLaceration Repair  Date/Time: 07/29/2020 6:55 AM Performed by: Delora Fuel, MD Authorized by: Delora Fuel, MD   Consent:    Consent obtained:  Verbal   Consent given by:  Patient   Risks discussed:  Pain, infection, poor cosmetic result and retained foreign body   Alternatives discussed:  No treatment Universal protocol:    Procedure explained and questions answered to patient or proxy's satisfaction: yes     Relevant documents present and verified: yes     Test results available: yes     Imaging studies available: yes     Required blood products, implants, devices, and special equipment available: yes     Site/side marked: yes     Immediately prior to procedure, a time out was called: yes     Patient identity confirmed:  Verbally with patient and arm band Anesthesia:    Anesthesia method:  Local infiltration   Local anesthetic:  Lidocaine 2% WITH epi Laceration details:    Location:  Shoulder/arm   Shoulder/arm  location:  R lower arm   Length (cm):  6   Depth (mm):  4 Pre-procedure details:    Preparation:  Patient was prepped and draped in usual sterile fashion and imaging obtained to evaluate for foreign bodies Exploration:    Limited defect created (wound extended): no     Hemostasis achieved with:  Direct pressure   Imaging obtained: x-ray     Imaging outcome: foreign body not noted     Wound exploration: entire depth of wound visualized     Wound extent: no foreign bodies/material noted, no muscle damage noted, no nerve damage noted and no tendon damage noted     Contaminated: no   Treatment:    Area cleansed with:  Saline   Amount of cleaning:  Standard   Debridement:  Minimal (Small flap of devitalized skin was debrided)   Undermining:  None   Scar revision: no   Skin repair:    Repair method:  Sutures   Suture size:  4-0   Suture material:  Nylon   Number of sutures:  1 Approximation:    Approximation:  Close Repair type:    Repair type:  Intermediate Post-procedure details:    Dressing:  Antibiotic ointment and sterile dressing   Procedure completion:  Tolerated well, no immediate complications   Medications Ordered in ED Medications  lidocaine-EPINEPHrine (XYLOCAINE W/EPI) 2 %-1:200000 (PF) injection 10 mL (has no administration in time range)    ED Course  I have reviewed the triage vital signs and the nursing notes.  Pertinent imaging results that were available during my care of the patient were reviewed by me and considered in my medical decision making (see chart for details).   MDM Rules/Calculators/A&P                         Fall with injury to right forearm.  Old records are reviewed, she has no relevant past visits.  She is sent for x-ray to rule out glass foreign body.  X-ray shows possible foreign body in the distal forearm.  This is not in the location where the laceration is present and there  is no evidence of foreign body in the laceration.  Laceration  is closed with sutures.  Final Clinical Impression(s) / ED Diagnoses Final diagnoses:  Fall from slip, trip, or stumble, initial encounter  Laceration of right forearm, initial encounter    Rx / DC Orders ED Discharge Orders     None        Delora Fuel, MD [phone]

## 2020-08-04 DIAGNOSIS — R768 Other specified abnormal immunological findings in serum: Secondary | ICD-10-CM | POA: Diagnosis not present

## 2020-08-04 DIAGNOSIS — W19XXXS Unspecified fall, sequela: Secondary | ICD-10-CM | POA: Diagnosis not present

## 2020-08-04 DIAGNOSIS — S51011D Laceration without foreign body of right elbow, subsequent encounter: Secondary | ICD-10-CM | POA: Diagnosis not present

## 2020-08-09 DIAGNOSIS — F411 Generalized anxiety disorder: Secondary | ICD-10-CM | POA: Diagnosis not present

## 2020-08-09 DIAGNOSIS — F331 Major depressive disorder, recurrent, moderate: Secondary | ICD-10-CM | POA: Diagnosis not present

## 2020-08-10 DIAGNOSIS — S51011D Laceration without foreign body of right elbow, subsequent encounter: Secondary | ICD-10-CM | POA: Diagnosis not present

## 2020-08-11 ENCOUNTER — Ambulatory Visit: Payer: PPO | Admitting: Adult Health

## 2020-08-11 VITALS — BP 104/70 | HR 103 | Ht 64.5 in | Wt 155.0 lb

## 2020-08-11 DIAGNOSIS — G4733 Obstructive sleep apnea (adult) (pediatric): Secondary | ICD-10-CM | POA: Diagnosis not present

## 2020-08-11 NOTE — Progress Notes (Signed)
PATIENT: Brianna Ross DOB: 07-14-1955  REASON FOR VISIT: follow up HISTORY FROM: patient PRIMARY NEUROLOGIST: Dr. Brett Fairy  HISTORY OF PRESENT ILLNESS: Today 08/11/20:  Brianna Ross is a 65 year old female with a history of obstructive sleep apnea on BiPAP.  Her download indicates that he uses machine 29 out of 30 days for compliance of 97%.  She uses machine greater than 4 hours 26 days for compliance of 87%.  On average she uses her machine 7 hours and 52 minutes.  Her residual AHI is 30.5 on 16/10 centimeters of water her leak in the 95th percentile is 84.4 L/min.  The patient states that she has a [employer] fullface DreamWear mask she does not like the air blowing in her nostrils as she finds it very uncomfortable.    REVIEW OF SYSTEMS: Out of a complete 14 system review of symptoms, the patient complains only of the following symptoms, and all other reviewed systems are negative.   ESS 9  ALLERGIES: No Known Allergies  HOME MEDICATIONS: Outpatient Medications Prior to Visit  Medication Sig Dispense Refill   acetaminophen (TYLENOL) 500 MG tablet Take 500 mg by mouth 2 (two) times daily as needed (pain).     aspirin EC 81 MG tablet Take 81 mg by mouth daily.      buPROPion (WELLBUTRIN XL) 150 MG 24 hr tablet Take 450 mg by mouth daily.     calcium carbonate (OS-CAL) 600 MG tablet Take 600 mg by mouth in the morning and at bedtime.     Cholecalciferol (VITAMIN D) 2000 units tablet Take 4,000 Units by mouth daily.      clonazePAM (KLONOPIN) 0.5 MG tablet Take 1 mg by mouth at bedtime.      diclofenac sodium (VOLTAREN) 1 % GEL Apply 3 g to 3 large joints up to 3 times daily. 3 Tube 3   fluorometholone (FML) 0.1 % ophthalmic suspension Place 1 drop into the left eye daily.  5   hydroxychloroquine (PLAQUENIL) 200 MG tablet Take 1 tablet by mouth daily. 90 tablet 0   NONFORMULARY OR COMPOUNDED ITEM daily. Rosacea cream     OLANZapine (ZYPREXA) 10 MG tablet Take 10 mg by mouth daily.      rosuvastatin (CRESTOR) 20 MG tablet Take 20 mg by mouth daily.     valACYclovir (VALTREX) 1000 MG tablet Take 1,000 mg by mouth daily.     venlafaxine XR (EFFEXOR-XR) 150 MG 24 hr capsule Take 300 mg by mouth daily with breakfast.     No facility-administered medications prior to visit.    PAST MEDICAL HISTORY: Past Medical History:  Diagnosis Date   Abnormal stress electrocardiogram test    Allergy    ANA positive    Atypical nevi    Depression    Headache(784.0)    Hives 2011   x5   Hyperlipidemia    Menopause    OSA (obstructive sleep apnea)    Osteoarthritis    Right wrist fracture    Rosacea    Shingles 03/2012   Shortness of breath    Sleep apnea    Urticaria     PAST SURGICAL HISTORY: Past Surgical History:  Procedure Laterality Date   ORIF WRIST FRACTURE     thymic cyst     removal   WISDOM TOOTH EXTRACTION      FAMILY HISTORY: Family History  Problem Relation Age of Onset   Glaucoma Mother    Hyperlipidemia Mother    Depression Mother  Osteoporosis Mother    Breast cancer Mother    Heart disease Father    Hyperlipidemia Father    Hypertension Father    Heart defect Sister    Heart disease Sister 55       congenital heart defect,deceased   Hyperlipidemia Brother    Depression Brother    High Cholesterol Brother    Cancer Paternal Grandmother    Stroke Paternal Grandfather    Heart disease Paternal Grandfather     SOCIAL HISTORY: Social History   Socioeconomic History   Marital status: Married    Spouse name: Sonny   Number of children: 0   Years of education: 16   Highest education level: Not on file  Occupational History    Employer: [employer]  Tobacco Use   Smoking status: Never   Smokeless tobacco: Never  Vaping Use   Vaping Use: Never used  Substance and Sexual Activity   Alcohol use: No   Drug use: Never   Sexual activity: Not Currently    Birth control/protection: Post-menopausal  Other Topics  Concern   Not on file  Social History Narrative   Patient works for [employer].(full-time_    Patient lives at home with her husband [name]).   Patient does not have any children.   Patient is right-handed.   Patient drinks one cup of coffee everyday and two cups of tea daily.   Social Determinants of Health   Financial Resource Strain: Not on file  Food Insecurity: Not on file  Transportation Needs: Not on file  Physical Activity: Not on file  Stress: Not on file  Social Connections: Not on file  Intimate Partner Violence: Not on file      PHYSICAL EXAM  Vitals:   08/11/20 0949  BP: 104/70  Pulse: (!) 103  Weight: 155 lb (70.3 kg)  Height: 5' 4.5" (1.638 m)   Body mass index is 26.19 kg/m.  Generalized: Well developed, in no acute distress  Chest: Lungs clear to auscultation bilaterally  Neurological examination  Mentation: Alert oriented to time, place, history taking. Follows all commands speech and language fluent Cranial nerve II-XII: Extraocular movements were full, visual field were full on confrontational test Head turning and shoulder shrug  were normal and symmetric. Motor: The motor testing reveals 5 over 5 strength of all 4 extremities. Good symmetric motor tone is noted throughout.  Sensory: Sensory testing is intact to soft touch on all 4 extremities. No evidence of extinction is noted.  Gait and station: Gait is normal.    DIAGNOSTIC DATA (LABS, IMAGING, TESTING) - I reviewed patient records, labs, notes, testing and imaging myself where available.  Lab Results  Component Value Date   WBC 5.4 07/18/2020   HGB 13.1 07/18/2020   HCT 39.0 07/18/2020   MCV 90.9 07/18/2020   PLT 194 07/18/2020      Component Value Date/Time   NA 140 07/18/2020 1155   K 3.9 07/18/2020 1155   CL 104 07/18/2020 1155   CO2 30 07/18/2020 1155   GLUCOSE 108 (H) 07/18/2020 1155   BUN 12 07/18/2020 1155   CREATININE 0.83 07/18/2020 1155    CREATININE 0.87 06/14/2020 1426   CALCIUM 10.0 07/18/2020 1155   PROT 6.4 (L) 07/18/2020 1155   ALBUMIN 4.2 07/18/2020 1155   AST 24 07/18/2020 1155   ALT 36 07/18/2020 1155   ALKPHOS 48 07/18/2020 1155   BILITOT 0.3 07/18/2020 1155   GFRNONAA >60 07/18/2020 1155   GFRNONAA  71 06/14/2020 1426   GFRAA 82 06/14/2020 1426   Lab Results  Component Value Date   CHOL 188 11/13/2013   HDL 68 11/13/2013   LDLCALC 104 (H) 11/13/2013   TRIG 82 11/13/2013   CHOLHDL 2.8 11/13/2013   No results found for: HGBA1C No results found for: VITAMINB12 Lab Results  Component Value Date   TSH 1.684 11/13/2013      ASSESSMENT AND PLAN 66 y.o. year old female  has a past medical history of Abnormal stress electrocardiogram test, Allergy, ANA positive, Atypical nevi, Depression, Headache(784.0), Hives (2011), Hyperlipidemia, Menopause, OSA (obstructive sleep apnea), Osteoarthritis, Right wrist fracture, Rosacea, Shingles (03/2012), Shortness of breath, Sleep apnea, and Urticaria. here with:  OSA on BiPAP  - BiPAP compliance excellent -Residual AHI is elevated but the patient has a high leak with her mask -We will do a mask refitting and also get the patient set up for BiPAP titration.  It will take 1 to 2 months to schedule the titration due to the lab schedule.  In the meantime the patient will see if the mask refitting is beneficial and we will review a download in 2 to 3 weeks to see if her residual AHI decreases with a better mask refit. -Keep appointment scheduled in November   Laquan Beier Snelling, MSN, NP-C 08/11/2020, 10:11 AM [facility] [address], [place], [place] [address] [phone]

## 2020-08-11 NOTE — Patient Instructions (Signed)
Continue using  BIPAP nightly and greater than 4 hours each night  Mask refitting ordered Order placed for bipap titration  If your symptoms worsen or you develop new symptoms please let us know.

## 2020-08-22 DIAGNOSIS — F411 Generalized anxiety disorder: Secondary | ICD-10-CM | POA: Diagnosis not present

## 2020-08-22 DIAGNOSIS — Z79899 Other long term (current) drug therapy: Secondary | ICD-10-CM | POA: Diagnosis not present

## 2020-08-22 DIAGNOSIS — F333 Major depressive disorder, recurrent, severe with psychotic symptoms: Secondary | ICD-10-CM | POA: Diagnosis not present

## 2020-08-22 DIAGNOSIS — F061 Catatonic disorder due to known physiological condition: Secondary | ICD-10-CM | POA: Diagnosis not present

## 2020-08-22 DIAGNOSIS — F5101 Primary insomnia: Secondary | ICD-10-CM | POA: Diagnosis not present

## 2020-08-24 DIAGNOSIS — F331 Major depressive disorder, recurrent, moderate: Secondary | ICD-10-CM | POA: Diagnosis not present

## 2020-08-24 DIAGNOSIS — F411 Generalized anxiety disorder: Secondary | ICD-10-CM | POA: Diagnosis not present

## 2020-09-02 DIAGNOSIS — F061 Catatonic disorder due to known physiological condition: Secondary | ICD-10-CM | POA: Diagnosis not present

## 2020-09-02 DIAGNOSIS — E785 Hyperlipidemia, unspecified: Secondary | ICD-10-CM | POA: Diagnosis not present

## 2020-09-02 DIAGNOSIS — F3341 Major depressive disorder, recurrent, in partial remission: Secondary | ICD-10-CM | POA: Diagnosis not present

## 2020-09-02 DIAGNOSIS — E7800 Pure hypercholesterolemia, unspecified: Secondary | ICD-10-CM | POA: Diagnosis not present

## 2020-09-08 DIAGNOSIS — F331 Major depressive disorder, recurrent, moderate: Secondary | ICD-10-CM | POA: Diagnosis not present

## 2020-09-08 DIAGNOSIS — F411 Generalized anxiety disorder: Secondary | ICD-10-CM | POA: Diagnosis not present

## 2020-09-19 ENCOUNTER — Telehealth: Payer: Self-pay

## 2020-09-19 DIAGNOSIS — F061 Catatonic disorder due to known physiological condition: Secondary | ICD-10-CM | POA: Diagnosis not present

## 2020-09-19 DIAGNOSIS — Z79899 Other long term (current) drug therapy: Secondary | ICD-10-CM | POA: Diagnosis not present

## 2020-09-19 DIAGNOSIS — F411 Generalized anxiety disorder: Secondary | ICD-10-CM | POA: Diagnosis not present

## 2020-09-19 DIAGNOSIS — F5101 Primary insomnia: Secondary | ICD-10-CM | POA: Diagnosis not present

## 2020-09-19 DIAGNOSIS — F332 Major depressive disorder, recurrent severe without psychotic features: Secondary | ICD-10-CM | POA: Diagnosis not present

## 2020-09-19 NOTE — Telephone Encounter (Signed)
Called patient to see how her mask refitting appt went since last visit with [place] on 08/11/20. Brianna Ross ordered a mask refit and possible titration study if mask refit didn't go well. Waiting for pt to return my call to see what our nest step should be with her.

## 2020-09-23 ENCOUNTER — Telehealth: Payer: Self-pay | Admitting: Adult Health

## 2020-09-23 NOTE — Telephone Encounter (Signed)
Pt called stating that on her last appt she was informed that a full face mask order would be put in for her at [facility]. Pt states she called them and they informed her that they have never received that order. Pt would like to know when that order is to be put in. Please advise.

## 2020-09-26 NOTE — Telephone Encounter (Signed)
Urgent, secure message sent to [facility] staff. Patient has been updated. She verbalized appreciation for the call.

## 2020-10-06 ENCOUNTER — Telehealth: Payer: Self-pay

## 2020-10-06 NOTE — Telephone Encounter (Signed)
LVM for patient to call back, checking on mask refit.

## 2020-10-10 ENCOUNTER — Telehealth: Payer: Self-pay

## 2020-10-10 NOTE — Telephone Encounter (Signed)
Spoke with pt about scheduling an in lab, just waiting for authorization to schedule.

## 2020-10-17 DIAGNOSIS — Z79899 Other long term (current) drug therapy: Secondary | ICD-10-CM | POA: Diagnosis not present

## 2020-10-17 DIAGNOSIS — F333 Major depressive disorder, recurrent, severe with psychotic symptoms: Secondary | ICD-10-CM | POA: Diagnosis not present

## 2020-10-17 DIAGNOSIS — F061 Catatonic disorder due to known physiological condition: Secondary | ICD-10-CM | POA: Diagnosis not present

## 2020-10-17 DIAGNOSIS — F5101 Primary insomnia: Secondary | ICD-10-CM | POA: Diagnosis not present

## 2020-10-17 DIAGNOSIS — F411 Generalized anxiety disorder: Secondary | ICD-10-CM | POA: Diagnosis not present

## 2020-10-21 DIAGNOSIS — L03011 Cellulitis of right finger: Secondary | ICD-10-CM | POA: Diagnosis not present

## 2020-10-21 DIAGNOSIS — L658 Other specified nonscarring hair loss: Secondary | ICD-10-CM | POA: Diagnosis not present

## 2020-10-21 DIAGNOSIS — Z23 Encounter for immunization: Secondary | ICD-10-CM | POA: Diagnosis not present

## 2020-11-17 DIAGNOSIS — F411 Generalized anxiety disorder: Secondary | ICD-10-CM | POA: Diagnosis not present

## 2020-11-17 DIAGNOSIS — F5101 Primary insomnia: Secondary | ICD-10-CM | POA: Diagnosis not present

## 2020-11-17 DIAGNOSIS — Z79899 Other long term (current) drug therapy: Secondary | ICD-10-CM | POA: Diagnosis not present

## 2020-11-17 DIAGNOSIS — F061 Catatonic disorder due to known physiological condition: Secondary | ICD-10-CM | POA: Diagnosis not present

## 2020-11-17 DIAGNOSIS — F332 Major depressive disorder, recurrent severe without psychotic features: Secondary | ICD-10-CM | POA: Diagnosis not present

## 2020-11-22 DIAGNOSIS — F061 Catatonic disorder due to known physiological condition: Secondary | ICD-10-CM | POA: Diagnosis not present

## 2020-11-22 DIAGNOSIS — F3341 Major depressive disorder, recurrent, in partial remission: Secondary | ICD-10-CM | POA: Diagnosis not present

## 2020-11-22 DIAGNOSIS — E785 Hyperlipidemia, unspecified: Secondary | ICD-10-CM | POA: Diagnosis not present

## 2020-11-22 DIAGNOSIS — E7800 Pure hypercholesterolemia, unspecified: Secondary | ICD-10-CM | POA: Diagnosis not present

## 2020-11-22 DIAGNOSIS — F322 Major depressive disorder, single episode, severe without psychotic features: Secondary | ICD-10-CM | POA: Diagnosis not present

## 2020-11-23 ENCOUNTER — Ambulatory Visit: Payer: PPO | Admitting: Adult Health

## 2020-11-23 ENCOUNTER — Other Ambulatory Visit: Payer: Self-pay | Admitting: Rheumatology

## 2020-11-23 DIAGNOSIS — M359 Systemic involvement of connective tissue, unspecified: Secondary | ICD-10-CM

## 2020-11-23 NOTE — Telephone Encounter (Signed)
Next Visit: 01/23/2021  Last Visit: 07/25/2020  Labs: 07/18/2020 Glucose 108, Total Protein 6.4  Eye exam: 04/14/2020   Current Dose per office note 07/25/2020:  I will increase hydroxychloroquine to 1 tablet p.o. daily.   DX: Autoimmune disease   Last Fill: 07/25/2020  Okay to refill Plaquenil?

## 2020-11-27 ENCOUNTER — Other Ambulatory Visit: Payer: Self-pay

## 2020-11-27 ENCOUNTER — Ambulatory Visit (INDEPENDENT_AMBULATORY_CARE_PROVIDER_SITE_OTHER): Payer: PPO | Admitting: Neurology

## 2020-11-27 DIAGNOSIS — F324 Major depressive disorder, single episode, in partial remission: Secondary | ICD-10-CM

## 2020-11-27 DIAGNOSIS — G4733 Obstructive sleep apnea (adult) (pediatric): Secondary | ICD-10-CM | POA: Diagnosis not present

## 2020-11-27 DIAGNOSIS — F4321 Adjustment disorder with depressed mood: Secondary | ICD-10-CM

## 2020-11-28 ENCOUNTER — Telehealth: Payer: Self-pay | Admitting: Adult Health

## 2020-11-28 DIAGNOSIS — G4733 Obstructive sleep apnea (adult) (pediatric): Secondary | ICD-10-CM

## 2020-11-28 NOTE — Telephone Encounter (Signed)
Please call patient and let her know that bipap tiration showed best treated with a small and wide ResMed F30  mask and BiPAP pressure of 20/16 cmH20 achieved a reduction of the AHI to 1.4/h with improvement of sleep apnea.  Order placed for DME.

## 2020-11-28 NOTE — Telephone Encounter (Signed)
Noted thanks. I had told pt that if she needed to bring the card ahead of time I would call her back. Otherwise plan to bring card to her appt. No further action needed.

## 2020-11-28 NOTE — Telephone Encounter (Signed)
Spoke with pt and r/s her 11/30 appt for May 10, 2021 at 10:00 AM. Pt will bring her SD card then. Pt asked if she would need to bring her card in during the next 6 months?

## 2020-11-28 NOTE — Telephone Encounter (Signed)
No she is already on bipap just adjusting pressure

## 2020-11-28 NOTE — Telephone Encounter (Signed)
No reschedule for 6 months

## 2020-11-28 NOTE — Procedures (Signed)
PATIENT'S NAME:  Brianna Ross DOB:      [birth year]      MR#:    [id number]     DATE OF RECORDING: 11/27/2020  SF REFERRING M.D.:  Leeroy Cha, MD, Titration ordered by: Ward Givens, NP Study Performed:   Titration to positive Airway Pressure HISTORY:  08-11-2020 [name] is a 65 year old widowed female with a history of severe obstructive sleep apnea on BiPAP.  Her download indicates that he uses machine 29 out of 30 days for compliance of 97%.  She uses machine greater than 4 hours 26 days for compliance of 87%.  On average she uses her machine 7 hours and 52 minutes.  Her residual AHI is 30.5 on 16/10 centimeters of water her leak in the 95th percentile is 84.4 L/min.  The patient states that she has a [employer] full-face DreamWear mask but she does not like the air blowing in her nostrils as she finds it very uncomfortable. The last baseline (diagnostic study) was a SPLIT night protocol from 02-04-2012, AHI was 57.9/h at that time. strongly supine dependent.   The patient endorsed the Epworth Sleepiness Scale at 9 points.    The patient's weight 155 pounds with a height of 64 (inches), resulting in a BMI of 26.3 kg/m2.  The patient's neck circumference measured XYZ inches.  CURRENT MEDICATIONS: Tylenol, Aspirin, Wellbutrin XL, Os-Cal, Vitamin D, Klonopin, Voltaren, Plaquenil, Zyprexa, Crestor, Valtrex, Effexor-XR, Fluorometholone Compounded Item    PROCEDURE:  This is a multichannel digital polysomnogram utilizing the SomnoStar 11.2 system.  Electrodes and sensors were applied and monitored per AASM Specifications.   EEG, EOG, Chin and Limb EMG, were sampled at 200 Hz.  ECG, Snore and Nasal Pressure, Thermal Airflow, Respiratory Effort, CPAP Flow and Pressure, Oximetry was sampled at 50 Hz. Digital video and audio were recorded.      The patient was fitted with a small and wide ResMed F30 i mask. BiPAP was initiated at 14/10 cmH20 with heated humidity per AASM Protocol and this  pressure was advanced to 20/16 cmH20 because of hypopneas, apneas and desaturations.  At a PAP pressure of 20/16 cmH20, there was a reduction of the AHI to 1.4/h with improvement of sleep apnea.  Lights Out was at 20:56 and Lights On at 05:28. Total recording time (TRT) was 512 minutes, with a total sleep time (TST) of 389.5 minutes. The patient's sleep latency was 29.5 minutes. REM latency was 441 minutes.  The sleep efficiency was 76.1 %.    SLEEP ARCHITECTURE: WASO (Wake after sleep onset) was 103.5 minutes.  There were 26.5 minutes in Stage N1, 305.5 minutes Stage N2, 9 minutes Stage N3 and 48.5 minutes in Stage REM.  The percentage of Stage N1 was 6.8%, Stage N2 was 78.4%, Stage N3 was 2.3% and Stage R (REM sleep) was 12.5%. The sleep architecture was notable for Delayed REM sleep onset and the study ended with REM sleep.     RESPIRATORY ANALYSIS:  There was a total of 54 respiratory events: 0 obstructive apneas, 5 central apneas and 0 mixed apneas with a total of 5 apneas and an apnea index (AI) of .8 /hour. There were 49 hypopneas with a hypopnea index of 7.5/hour.  The total APNEA/HYPOPNEA INDEX (AHI) was 8.3 /hour. 4 events occurred in REM sleep and 50 events in NREM. The REM AHI was 4.9 /hour versus a non-REM AHI of 8.8 /hour.  The patient spent 189 minutes of total sleep time in the supine position and 201  minutes in non-supine.  The supine AHI was 15.9, versus a non-supine AHI of 1.2.  OXYGEN SATURATION & C02:  The baseline 02 saturation was 95%, with the lowest being 89%. Time spent below 89% saturation equaled 0 minutes. The arousals were noted as: 54 were spontaneous, 0 were associated with PLMs, 24 were associated with respiratory events. The patient had a total of 0 Periodic Limb Movements.  Audio and video analysis did not show any abnormal or unusual movements, behaviors, phonations or vocalizations.  EKG was in regular rhythm (NSR).   DIAGNOSIS : The patient was fitted with a  small and wide ResMed F30 i mask. BiPAP pressure of 20/16 cmH20 achieved a reduction of the AHI to 1.4/h with improvement of sleep apnea.  Obstructive Sleep Apnea controlled on BIPAP, high pressure 20/ 16 cm water. Non-specific EKG changes  PLANS/RECOMMENDATIONS: Follow-up with referring NP - you will be using the new mask and soon a new machine set to the above stated pressures, with heated humidity and heated tubing. Educate patient in sleep hygiene measures. Sleep position is partially dictated by the interface.    Maintain lean body weight.   DISCUSSION: A follow up appointment will be scheduled in the Sleep Clinic at [facility] with Ward Givens, NP.   Please call [phone] with any questions.      I certify that I have reviewed the entire raw data recording prior to the issuance of this report in accordance with the Standards of Accreditation of the American Academy of Sleep Medicine (AASM)  Larey Seat, M.D. Diplomat, ABPN, Board of Neurology  Diplomat, Tax adviser of Sleep Medicine Market researcher, [employer] [facility] at [employer]

## 2020-11-28 NOTE — Telephone Encounter (Signed)
Spoke with pt and discussed results from titration study per MM NP. Pt verbalized understanding. I let her know the order will be sent to [facility]. Pt states she uses the [facility] location on [facility] [address] [facility]. Pt also said she lost her mask this AM. She already called up here to discuss with sleep team and they will follow-up.   Order sent to [facility].

## 2020-11-30 ENCOUNTER — Ambulatory Visit: Payer: PPO | Admitting: Adult Health

## 2020-11-30 NOTE — Telephone Encounter (Signed)
[facility] confirmed receipt of order.  

## 2020-12-06 DIAGNOSIS — G4733 Obstructive sleep apnea (adult) (pediatric): Secondary | ICD-10-CM | POA: Diagnosis not present

## 2020-12-21 DIAGNOSIS — F5101 Primary insomnia: Secondary | ICD-10-CM | POA: Diagnosis not present

## 2020-12-21 DIAGNOSIS — F411 Generalized anxiety disorder: Secondary | ICD-10-CM | POA: Diagnosis not present

## 2020-12-21 DIAGNOSIS — F339 Major depressive disorder, recurrent, unspecified: Secondary | ICD-10-CM | POA: Diagnosis not present

## 2020-12-21 DIAGNOSIS — F061 Catatonic disorder due to known physiological condition: Secondary | ICD-10-CM | POA: Diagnosis not present

## 2020-12-21 DIAGNOSIS — Z79899 Other long term (current) drug therapy: Secondary | ICD-10-CM | POA: Diagnosis not present

## 2021-01-09 NOTE — Progress Notes (Signed)
Office Visit Note  Patient: Brianna Ross             Date of Birth: 27-Apr-1955           MRN: [id number]             PCP: Leeroy Cha, MD Referring: Leeroy Cha,* Visit Date: 01/23/2021 Occupation: @GUAROCC @  Subjective:  Discuss lab work   History of Present Illness: Brianna Ross is a 66 y.o. female with history of autoimmune disease and osteoarthritis.  She is taking plaquenil 200 mg 1 tablet by mouth daily.  She increased the dose of Plaquenil from 200 mg 1 tablet every other day to 1 tablet daily after her last office visit in July 2022.  She has been tolerating Plaquenil without any side effects.  She denies any increased joint pain or joint swelling.  She has not had any morning stiffness or nocturnal pain.  She denies any recent rashes, oral or nasal ulcerations, cervical lymphadenopathy, shortness of breath, pleuritic chest pain, palpitations, Raynaud's phenomenon, photosensitivity, or sicca symptoms.  She has noticed increased hair loss.  She was evaluated by her PCP who recommended taking nutrafol which she has not started yet.  Activities of Daily Living:  Patient reports morning stiffness for 0 minute.   Patient Denies nocturnal pain.  Difficulty dressing/grooming: Denies Difficulty climbing stairs: Denies Difficulty getting out of chair: Denies Difficulty using hands for taps, buttons, cutlery, and/or writing: Denies  Review of Systems  Constitutional:  Negative for fatigue.  HENT:  Negative for mouth sores, mouth dryness and nose dryness.   Eyes:  Negative for pain, itching, visual disturbance and dryness.  Respiratory:  Negative for cough, hemoptysis, shortness of breath and difficulty breathing.   Cardiovascular:  Negative for chest pain, palpitations, hypertension and swelling in legs/feet.  Gastrointestinal:  Negative for blood in stool, constipation and diarrhea.  Endocrine: Negative for increased urination.  Genitourinary:  Negative for difficulty  urinating and painful urination.  Musculoskeletal:  Negative for joint pain, joint pain, joint swelling, myalgias, muscle weakness, morning stiffness, muscle tenderness and myalgias.  Skin:  Negative for color change, pallor, rash, hair loss, nodules/bumps, redness, skin tightness, ulcers and sensitivity to sunlight.  Allergic/Immunologic: Negative for susceptible to infections.  Neurological:  Negative for dizziness, numbness, headaches, memory loss and weakness.  Hematological:  Negative for bruising/bleeding tendency and swollen glands.  Psychiatric/Behavioral:  Negative for depressed mood, confusion and sleep disturbance. The patient is not nervous/anxious.    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   Positive ANA (antinuclear antibody) 03/06/2012   Elevated aldolase level 03/06/2012   Atypical nevi 11/14/2011   Menopause 11/13/2011   Rosacea 11/13/2011    Past Medical History:  Diagnosis Date   Abnormal stress electrocardiogram test    Allergy  ANA positive    Atypical nevi    Depression    Headache(784.0)    Hives 2011   x5   Hyperlipidemia    Menopause    OSA (obstructive sleep apnea)    Osteoarthritis    Right wrist  fracture    Rosacea    Shingles 03/2012   Shortness of breath    Sleep apnea    Urticaria     Family History  Problem Relation Age of Onset   Glaucoma Mother    Hyperlipidemia Mother    Depression Mother    Osteoporosis Mother    Breast cancer Mother    Dementia Mother    Cancer Mother    Heart disease Father    Hyperlipidemia Father    Hypertension Father    Heart defect Sister    Heart disease Sister 73       congenital heart defect,deceased   Hyperlipidemia Brother    Depression Brother    High Cholesterol Brother    Cancer Paternal Grandmother    Stroke Paternal Grandfather    Heart disease Paternal Grandfather    Past Surgical History:  Procedure Laterality Date   ORIF WRIST FRACTURE     thymic cyst     removal   WISDOM TOOTH EXTRACTION     Social History   Social History Narrative   Patient works for [employer].(full-time_    Patient lives at home with her husband [name]).   Patient does not have any children.   Patient is right-handed.   Patient drinks one cup of coffee everyday and two cups of tea daily.   Immunization History  Administered Date(s) Administered   Influenza Split 11/13/2011   Influenza,inj,Quad PF,6+ Mos 11/20/2016   Influenza-Unspecified 09/11/2013   Moderna Sars-Covid-2 Vaccination 02/25/2019, 03/25/2019, 11/24/2019   Pneumococcal Polysaccharide-23 11/12/2012     Objective: Vital Signs: BP 112/82 (BP Location: Left Arm, Patient Position: Sitting, Cuff Size: Normal)    Pulse (!) 102    Ht 5' 4.5" (1.638 m)    Wt 162 lb 6.4 oz (73.7 kg)    BMI 27.45 kg/m    Physical Exam Vitals and nursing note reviewed.  Constitutional:      Appearance: She is well-developed.  HENT:     Head: Normocephalic and atraumatic.  Eyes:     Conjunctiva/sclera: Conjunctivae normal.  Cardiovascular:     Rate and Rhythm: Normal rate and regular rhythm.     Heart sounds: Normal heart sounds.  Pulmonary:     Effort: Pulmonary effort  is normal.     Breath sounds: Normal breath sounds.  Abdominal:     General: Bowel sounds are normal.     Palpations: Abdomen is soft.  Musculoskeletal:     Cervical back: Normal range of motion.  Skin:    General: Skin is warm and dry.     Capillary Refill: Capillary refill takes less than 2 seconds.  Neurological:     Mental Status: She is alert and oriented to person, place, and time.  Psychiatric:        Behavior: Behavior normal.     Musculoskeletal Exam: C-spine, thoracic spine, and lumbar spine good ROM.  Shoulder joints, elbow joints, wrist joints, MCPs, PIPs, and DIPs good ROM with no synovitis.  Complete fist formation bilaterally.  Hip joints, knee joints, and ankle joints have good ROM with no discomfort.  No warmth or effusion of knee joints.  No tenderness or swelling of ankle joints.  CDAI Exam: CDAI Score: -- Patient Global: --; Provider Global: -- Swollen: --; Tender: -- Joint Exam 01/23/2021   No joint exam has been documented for this visit   There is currently no information documented on the homunculus. Go to the Rheumatology activity and complete the homunculus joint exam.  Investigation: No additional findings.  Imaging: No results found.  Recent Labs: Lab Results  Component Value Date   WBC 5.3 01/17/2021   HGB 13.9 01/17/2021   PLT 208 01/17/2021   NA 142 01/17/2021   K 3.8 01/17/2021   CL 105 01/17/2021   CO2 32 01/17/2021   GLUCOSE 85 01/17/2021   BUN 15 01/17/2021   CREATININE 0.85 01/17/2021   BILITOT 0.4 01/17/2021   ALKPHOS 48 07/18/2020   AST 25 01/17/2021   ALT 38 (H) 01/17/2021   PROT 6.4 01/17/2021   ALBUMIN 4.2 07/18/2020   CALCIUM 10.1 01/17/2021   GFRAA 82 06/14/2020    Speciality Comments: PLQ Eye Exam: 04/14/2020 @ [facility] Follow up in 1 year  Procedures:  No procedures performed Allergies: Patient has no known allergies.      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  The dose of Plaquenil was increased from 200 mg 1 tablet every other day to 1 tablet daily after her last office visit on 07/25/20.  She has been tolerating Plaquenil without any side effects.  She has not noticed any new or worsening symptoms since her last office visit.  Lab work from 01/17/21 were reviewed today in the office: dsDNA is negative, complements WNL, ESR WNL, beta-2 glycoprotein IgG and IgA positive, anticardiolipin antibodies IgG and IgA are positive, UA normal, and vitamin D WNL.  She remains on aspirin 81 mg daily.  She was previously evaluated by Dr. Marin Olp on 07/18/2020 who did not recommend anticoagulation therapy at that time.  She has not had any thromboembolic events. Beta-2 glycoprotein, lupus anticoagulant, anticardiolipin antibodies remain positive. She has not experienced any joint pain or inflammation at this time.  No synovitis was noted on examination today.  She has not had any recent rashes, photosensitivity, Raynaud's phenomenon, cervical lymphadenopathy, shortness of breath, pleuritic chest pain, palpitations, or sicca symptoms recently.  Her lungs were clear to auscultation on examination today.  Facial erythema was noted but consistent with rosacea.  She continues to follow-up with her dermatologist on a regular basis. She will remain on the current dose of Plaquenil as prescribed.  Hydroxychloroquine blood level is pending.  We will call her with these results and further discuss recommendations at that time.  She was advised to notify us if she develops any new or worsening symptoms.  She will follow up 6 months.   High risk medication use - Plaquenil 200 mg 1 tablet by mouth daily. PLQ Eye Exam: 04/14/2020 @ [facility] Follow up in  1 year.  Her next Plaquenil eye examination is scheduled in June 2023.  Hydroxychloroquine blood level pending.  CBC and CMP updated on 01/17/21.   Sicca syndrome [facility]): She is not experiencing any sicca symptoms at this time.   Osteopenia of multiple sites - DEXA 08/10/2019 T-score: -2.3, BMD: 0.592 left femoral hip. She is taking a calcium and vitamin D supplement daily.  No recent falls or fractures.  Primary osteoarthritis of both hands: She has PIP and DIP thickening consistent with osteoarthritis of both hands.  No tenderness or inflammation was noted.  Complete fist formation bilaterally.   Primary osteoarthritis of both knees: She has good ROM of both knee joints.  No warmth or effusion of knee joints noted.   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   History of sleep apnea  History of hyperlipidemia  Orders: No orders of the defined types were placed in this encounter.  No orders of the defined types were placed in this encounter.    Follow-Up Instructions: Return in about 6 months (around 07/23/2021) for Autoimmune Disease, Osteoarthritis.   Ofilia Neas, PA-C  Note - This record has been created using Dragon software.  Chart creation errors have been sought, but may not always  have been located. Such creation errors do not reflect on  the standard of medical care.

## 2021-01-17 ENCOUNTER — Other Ambulatory Visit: Payer: Self-pay | Admitting: *Deleted

## 2021-01-17 DIAGNOSIS — Z79899 Other long term (current) drug therapy: Secondary | ICD-10-CM

## 2021-01-17 DIAGNOSIS — M359 Systemic involvement of connective tissue, unspecified: Secondary | ICD-10-CM | POA: Diagnosis not present

## 2021-01-17 DIAGNOSIS — M3500 Sicca syndrome, unspecified: Secondary | ICD-10-CM | POA: Diagnosis not present

## 2021-01-17 DIAGNOSIS — E559 Vitamin D deficiency, unspecified: Secondary | ICD-10-CM | POA: Diagnosis not present

## 2021-01-17 DIAGNOSIS — R768 Other specified abnormal immunological findings in serum: Secondary | ICD-10-CM | POA: Diagnosis not present

## 2021-01-20 DIAGNOSIS — F411 Generalized anxiety disorder: Secondary | ICD-10-CM | POA: Diagnosis not present

## 2021-01-20 DIAGNOSIS — F061 Catatonic disorder due to known physiological condition: Secondary | ICD-10-CM | POA: Diagnosis not present

## 2021-01-20 DIAGNOSIS — F332 Major depressive disorder, recurrent severe without psychotic features: Secondary | ICD-10-CM | POA: Diagnosis not present

## 2021-01-20 DIAGNOSIS — F5101 Primary insomnia: Secondary | ICD-10-CM | POA: Diagnosis not present

## 2021-01-23 ENCOUNTER — Encounter: Payer: Self-pay | Admitting: Physician Assistant

## 2021-01-23 ENCOUNTER — Other Ambulatory Visit: Payer: Self-pay

## 2021-01-23 ENCOUNTER — Ambulatory Visit: Payer: Medicare Other | Admitting: Physician Assistant

## 2021-01-23 VITALS — BP 112/82 | HR 102 | Ht 64.5 in | Wt 162.4 lb

## 2021-01-23 DIAGNOSIS — M3500 Sicca syndrome, unspecified: Secondary | ICD-10-CM | POA: Diagnosis not present

## 2021-01-23 DIAGNOSIS — M19042 Primary osteoarthritis, left hand: Secondary | ICD-10-CM

## 2021-01-23 DIAGNOSIS — Z79899 Other long term (current) drug therapy: Secondary | ICD-10-CM | POA: Diagnosis not present

## 2021-01-23 DIAGNOSIS — M19041 Primary osteoarthritis, right hand: Secondary | ICD-10-CM

## 2021-01-23 DIAGNOSIS — F322 Major depressive disorder, single episode, severe without psychotic features: Secondary | ICD-10-CM

## 2021-01-23 DIAGNOSIS — M359 Systemic involvement of connective tissue, unspecified: Secondary | ICD-10-CM

## 2021-01-23 DIAGNOSIS — Z8639 Personal history of other endocrine, nutritional and metabolic disease: Secondary | ICD-10-CM

## 2021-01-23 DIAGNOSIS — Z8669 Personal history of other diseases of the nervous system and sense organs: Secondary | ICD-10-CM

## 2021-01-23 DIAGNOSIS — M8589 Other specified disorders of bone density and structure, multiple sites: Secondary | ICD-10-CM | POA: Diagnosis not present

## 2021-01-23 DIAGNOSIS — L719 Rosacea, unspecified: Secondary | ICD-10-CM

## 2021-01-23 DIAGNOSIS — M170 Bilateral primary osteoarthritis of knee: Secondary | ICD-10-CM

## 2021-01-23 DIAGNOSIS — Z8619 Personal history of other infectious and parasitic diseases: Secondary | ICD-10-CM

## 2021-01-27 LAB — COMPLETE METABOLIC PANEL WITH GFR
AG Ratio: 2.4 (calc) (ref 1.0–2.5)
ALT: 38 U/L — ABNORMAL HIGH (ref 6–29)
AST: 25 U/L (ref 10–35)
Albumin: 4.5 g/dL (ref 3.6–5.1)
Alkaline phosphatase (APISO): 45 U/L (ref 37–153)
BUN: 15 mg/dL (ref 7–25)
CO2: 32 mmol/L (ref 20–32)
Calcium: 10.1 mg/dL (ref 8.6–10.4)
Chloride: 105 mmol/L (ref 98–110)
Creat: 0.85 mg/dL (ref 0.50–1.05)
Globulin: 1.9 g/dL (calc) (ref 1.9–3.7)
Glucose, Bld: 85 mg/dL (ref 65–99)
Potassium: 3.8 mmol/L (ref 3.5–5.3)
Sodium: 142 mmol/L (ref 135–146)
Total Bilirubin: 0.4 mg/dL (ref 0.2–1.2)
Total Protein: 6.4 g/dL (ref 6.1–8.1)
eGFR: 76 mL/min/{1.73_m2} (ref >=60)

## 2021-01-27 LAB — CARDIOLIPIN ANTIBODIES, IGG, IGM, IGA
Anticardiolipin IgA: >65 APL-U/mL — ABNORMAL HIGH
Anticardiolipin IgG: 91.6 GPL-U/mL — ABNORMAL HIGH
Anticardiolipin IgM: 9.2 MPL-U/mL

## 2021-01-27 LAB — URINALYSIS, ROUTINE W REFLEX MICROSCOPIC
Bilirubin Urine: NEGATIVE
Glucose, UA: NEGATIVE
Hgb urine dipstick: NEGATIVE
Ketones, ur: NEGATIVE
Leukocytes,Ua: NEGATIVE
Nitrite: NEGATIVE
Protein, ur: NEGATIVE
Specific Gravity, Urine: 1.008 (ref 1.001–1.035)
pH: 7.5 (ref 5.0–8.0)

## 2021-01-27 LAB — CBC WITH DIFFERENTIAL/PLATELET
Absolute Monocytes: 631 cells/uL (ref 200–950)
Basophils Absolute: 69 cells/uL (ref 0–200)
Basophils Relative: 1.3 %
Eosinophils Absolute: 201 cells/uL (ref 15–500)
Eosinophils Relative: 3.8 %
HCT: 41.6 % (ref 35.0–45.0)
Hemoglobin: 13.9 g/dL (ref 11.7–15.5)
Lymphs Abs: 1473 cells/uL (ref 850–3900)
MCH: 30.3 pg (ref 27.0–33.0)
MCHC: 33.4 g/dL (ref 32.0–36.0)
MCV: 90.6 fL (ref 80.0–100.0)
MPV: 10.2 fL (ref 7.5–12.5)
Monocytes Relative: 11.9 %
Neutro Abs: 2926 cells/uL (ref 1500–7800)
Neutrophils Relative %: 55.2 %
Platelets: 208 10*3/uL (ref 140–400)
RBC: 4.59 10*6/uL (ref 3.80–5.10)
RDW: 12.4 % (ref 11.0–15.0)
Total Lymphocyte: 27.8 %
WBC: 5.3 10*3/uL (ref 3.8–10.8)

## 2021-01-27 LAB — LUPUS ANTICOAGULANT EVAL W/ REFLEX
PTT-LA Screen: 39 s (ref <=40)
dRVVT: 58 s — ABNORMAL HIGH (ref <=45)

## 2021-01-27 LAB — RFX DRVVT 1:1 MIX: PT, Mixing Interp: POSITIVE — AB

## 2021-01-27 LAB — SEDIMENTATION RATE: Sed Rate: 2 mm/h (ref 0–30)

## 2021-01-27 LAB — BETA-2 GLYCOPROTEIN ANTIBODIES
Beta-2 Glyco 1 IgA: >65 U/mL — ABNORMAL HIGH
Beta-2 Glyco 1 IgM: 13 U/mL
Beta-2 Glyco I IgG: >112 U/mL — ABNORMAL HIGH

## 2021-01-27 LAB — ANTI-DNA ANTIBODY, DOUBLE-STRANDED: ds DNA Ab: 2 IU/mL

## 2021-01-27 LAB — HYDROXYCHLOROQUINE,BLOOD: HYDROXYCHLOROQUINE, (B): 900 ng/mL — ABNORMAL HIGH

## 2021-01-27 LAB — VITAMIN D 25 HYDROXY (VIT D DEFICIENCY, FRACTURES): Vit D, 25-Hydroxy: 71 ng/mL (ref 30–100)

## 2021-01-27 LAB — RFLX DRVVT CONFRIM: DRVVT CONFIRM: POSITIVE — AB

## 2021-01-27 LAB — C3 AND C4
C3 Complement: 123 mg/dL (ref 83–193)
C4 Complement: 19 mg/dL (ref 15–57)

## 2021-01-27 NOTE — Progress Notes (Signed)
Hydroxychloroquine level is in therapeutic range.  Lupus anticoagulant is positive.  Anticardiolipin positive beta-2 GP 1 positive.  CBC normal, ALT mildly elevated.  Sed rate normal, complements normal, double-stranded DNA negative, UA negative.  She should continue hydroxychloroquine at the current dose and increase aspirin to enteric-coated 325 mg p.o. daily.

## 2021-02-20 DIAGNOSIS — F061 Catatonic disorder due to known physiological condition: Secondary | ICD-10-CM | POA: Diagnosis not present

## 2021-02-20 DIAGNOSIS — F332 Major depressive disorder, recurrent severe without psychotic features: Secondary | ICD-10-CM | POA: Diagnosis not present

## 2021-02-20 DIAGNOSIS — F411 Generalized anxiety disorder: Secondary | ICD-10-CM | POA: Diagnosis not present

## 2021-02-20 DIAGNOSIS — F5101 Primary insomnia: Secondary | ICD-10-CM | POA: Diagnosis not present

## 2021-02-27 ENCOUNTER — Other Ambulatory Visit: Payer: Self-pay | Admitting: Rheumatology

## 2021-02-27 DIAGNOSIS — M359 Systemic involvement of connective tissue, unspecified: Secondary | ICD-10-CM

## 2021-02-27 MED ORDER — HYDROXYCHLOROQUINE SULFATE 200 MG PO TABS: ORAL_TABLET | ORAL | 0 refills | Status: DC

## 2021-02-27 NOTE — Telephone Encounter (Signed)
Next Visit: 07/25/2021  Last Visit: 01/23/2021  Labs: 01/17/2021 CBC normal, ALT mildly elevated.    Eye exam:  04/14/2020   Current Dose per office note 01/23/2021: Plaquenil 200 mg 1 tablet by mouth daily  PE:LGKBOQUCLT disease  Last Fill: 11/23/2020  Okay to refill Plaquenil?

## 2021-02-27 NOTE — Telephone Encounter (Signed)
Patient called the office requesting a refill of Plaquenil 200mg  sent to [facility] on [address] in [place]. Patient states she is out of medication.

## 2021-03-20 DIAGNOSIS — F061 Catatonic disorder due to known physiological condition: Secondary | ICD-10-CM | POA: Diagnosis not present

## 2021-03-20 DIAGNOSIS — F411 Generalized anxiety disorder: Secondary | ICD-10-CM | POA: Diagnosis not present

## 2021-03-20 DIAGNOSIS — F332 Major depressive disorder, recurrent severe without psychotic features: Secondary | ICD-10-CM | POA: Diagnosis not present

## 2021-03-20 DIAGNOSIS — F5101 Primary insomnia: Secondary | ICD-10-CM | POA: Diagnosis not present

## 2021-04-20 DIAGNOSIS — F5101 Primary insomnia: Secondary | ICD-10-CM | POA: Diagnosis not present

## 2021-04-20 DIAGNOSIS — F339 Major depressive disorder, recurrent, unspecified: Secondary | ICD-10-CM | POA: Diagnosis not present

## 2021-04-20 DIAGNOSIS — F411 Generalized anxiety disorder: Secondary | ICD-10-CM | POA: Diagnosis not present

## 2021-04-20 DIAGNOSIS — F061 Catatonic disorder due to known physiological condition: Secondary | ICD-10-CM | POA: Diagnosis not present

## 2021-05-10 ENCOUNTER — Encounter: Payer: Self-pay | Admitting: Adult Health

## 2021-05-10 ENCOUNTER — Ambulatory Visit: Payer: Medicare Other | Admitting: Adult Health

## 2021-05-10 VITALS — BP 128/79 | HR 96 | Ht 64.5 in | Wt 164.8 lb

## 2021-05-10 DIAGNOSIS — G4733 Obstructive sleep apnea (adult) (pediatric): Secondary | ICD-10-CM

## 2021-05-10 NOTE — Progress Notes (Signed)
? ? ?PATIENT: Brianna Ross ?DOB: 06-13-1955 ? ?REASON FOR VISIT: follow up ?HISTORY FROM: patient ?PRIMARY NEUROLOGIST: Dr. Brett Fairy ? ?Chief Complaint  ?Patient presents with  ? Follow-up  ?  Rm 19, alone.  BIPAP. Adjusted straps on bipap and used last night.  Will DL  and see what it shows. Snapped.  ? ? ?HISTORY OF PRESENT ILLNESS: ?Today 05/10/21: ? ?Brianna Ross is a 66 year old female with a history of obstructive sleep apnea on BiPAP.  She returns today for follow-up.  At the last visit she was sent for BiPAP titration and an order was sent to her DME company to adjust her pressure however it appears this was never done.  Patient's download is below ? ? ? ?08/11/20: Brianna Ross is a 66 year old female with a history of obstructive sleep apnea on BiPAP.  Her download indicates that he uses machine 29 out of 30 days for compliance of 97%.  She uses machine greater than 4 hours 26 days for compliance of 87%.  On average she uses her machine 7 hours and 52 minutes.  Her residual AHI is 30.5 on 16/10 centimeters of water her leak in the 95th percentile is 84.4 L/min.  The patient states that she has a [employer] fullface DreamWear mask she does not like the air blowing in her nostrils as she finds it very uncomfortable. ? ? ? ?REVIEW OF SYSTEMS: Out of a complete 14 system review of symptoms, the patient complains only of the following symptoms, and all other reviewed systems are negative. ? ? ?ESS 3 ? ?ALLERGIES: ?No Known Allergies ? ?HOME MEDICATIONS: ?Outpatient Medications Prior to Visit  ?Medication Sig Dispense Refill  ? acetaminophen (TYLENOL) 500 MG tablet Take 500 mg by mouth 2 (two) times daily as needed (pain).    ? aspirin 325 MG EC tablet Take 325 mg by mouth daily.    ? buPROPion (WELLBUTRIN XL) 150 MG 24 hr tablet Take 450 mg by mouth daily.    ? calcium carbonate (OS-CAL) 600 MG tablet Take 600 mg by mouth in the morning and at bedtime.    ? Cholecalciferol (VITAMIN D) 2000 units tablet Take 4,000 Units by  mouth daily.     ? clonazePAM (KLONOPIN) 0.5 MG tablet Take 1 mg by mouth at bedtime.     ? diclofenac sodium (VOLTAREN) 1 % GEL Apply 3 g to 3 large joints up to 3 times daily. 3 Tube 3  ? fluorometholone (FML) 0.1 % ophthalmic suspension Place 1 drop into the left eye daily.  5  ? hydroxychloroquine (PLAQUENIL) 200 MG tablet TAKE ONE TABLET BY MOUTH DAILY 90 tablet 0  ? NONFORMULARY OR COMPOUNDED ITEM daily. Rosacea cream    ? OLANZapine (ZYPREXA) 10 MG tablet Take 10 mg by mouth daily.    ? rosuvastatin (CRESTOR) 20 MG tablet Take 20 mg by mouth daily.    ? venlafaxine XR (EFFEXOR-XR) 150 MG 24 hr capsule Take 300 mg by mouth daily with breakfast.    ? aspirin EC 81 MG tablet Take 81 mg by mouth daily.  (Patient not taking: Reported on 05/10/2021)    ? valACYclovir (VALTREX) 1000 MG tablet Take 1,000 mg by mouth daily. (Patient not taking: Reported on 05/10/2021)    ? ?No facility-administered medications prior to visit.  ? ? ?PAST MEDICAL HISTORY: ?Past Medical History:  ?Diagnosis Date  ? Abnormal stress electrocardiogram test   ? Allergy   ? ANA positive   ? Atypical nevi   ? Depression   ?  Headache(784.0)   ? Hives 2011  ? x5  ? Hyperlipidemia   ? Menopause   ? OSA (obstructive sleep apnea)   ? Osteoarthritis   ? Right wrist fracture   ? Rosacea   ? Shingles 03/2012  ? Shortness of breath   ? Sleep apnea   ? Urticaria   ? ? ?PAST SURGICAL HISTORY: ?Past Surgical History:  ?Procedure Laterality Date  ? ORIF WRIST FRACTURE    ? thymic cyst    ? removal  ? WISDOM TOOTH EXTRACTION    ? ? ?FAMILY HISTORY: ?Family History  ?Problem Relation Age of Onset  ? Glaucoma Mother   ? Hyperlipidemia Mother   ? Depression Mother   ? Osteoporosis Mother   ? Breast cancer Mother   ?     88y  ? Dementia Mother   ? Cancer Mother   ? Heart disease Father   ? Hyperlipidemia Father   ? Hypertension Father   ? Heart defect Sister   ? Heart disease Sister 44  ?     congenital heart defect,deceased  ? Hyperlipidemia Brother   ?  Depression Brother   ? High Cholesterol Brother   ? Cancer Paternal Grandmother   ? Stroke Paternal Grandfather   ? Heart disease Paternal Grandfather   ? ? ?SOCIAL HISTORY: ?Social History  ? ?Socioeconomic History  ? Marital status: Married  ?  Spouse name: [name]  ? Number of children: 0  ? Years of education: 42  ? Highest education level: Not on file  ?Occupational History  ?  Employer: [employer]  ?Tobacco Use  ? Smoking status: Never  ? Smokeless tobacco: Never  ?Vaping Use  ? Vaping Use: Never used  ?Substance and Sexual Activity  ? Alcohol use: No  ? Drug use: Never  ? Sexual activity: Not Currently  ?  Birth control/protection: Post-menopausal  ?Other Topics Concern  ? Not on file  ?Social History Narrative  ? Patient works for [employer].(full-time_  ?  Patient lives at home with her husband [name]).  ? Patient does not have any children.  ? Patient is right-handed.  ? Patient drinks one cup of coffee everyday and two cups of tea daily.  ? ?Social Determinants of Health  ? ?Financial Resource Strain: Not on file  ?Food Insecurity: Not on file  ?Transportation Needs: Not on file  ?Physical Activity: Not on file  ?Stress: Not on file  ?Social Connections: Not on file  ?Intimate Partner Violence: Not on file  ? ? ? ? ?PHYSICAL EXAM ? ?Vitals:  ? 05/10/21 0943  ?BP: 128/79  ?Pulse: 96  ?Weight: 164 lb 12.8 oz (74.8 kg)  ?Height: 5' 4.5" (1.638 m)  ? ?Body mass index is 27.85 kg/m?. ? ?Generalized: Well developed, in no acute distress  ?Chest: Lungs clear to auscultation bilaterally ? ?Neurological examination  ?Mentation: Alert oriented to time, place, history taking. Follows all commands speech and language fluent ?Cranial nerve II-XII: Extraocular movements were full, visual field were full on confrontational test Head turning and shoulder shrug  were normal and symmetric. ?Motor: The motor testing reveals 5 over 5 strength of all 4 extremities.  ?Gait and station: Gait  is normal.  ? ? ?DIAGNOSTIC DATA (LABS, IMAGING, TESTING) ?- I reviewed patient records, labs, notes, testing and imaging myself where available. ? ?Lab Results  ?Component Value Date  ? WBC 5.3 01/17/2021  ? HGB 13.9 01/17/2021  ? HCT 41.6 01/17/2021  ? MCV 90.6  01/17/2021  ? PLT 208 01/17/2021  ? ?   ?Component Value Date/Time  ? NA 142 01/17/2021 1443  ? K 3.8 01/17/2021 1443  ? CL 105 01/17/2021 1443  ? CO2 32 01/17/2021 1443  ? GLUCOSE 85 01/17/2021 1443  ? BUN 15 01/17/2021 1443  ? CREATININE 0.85 01/17/2021 1443  ? CALCIUM 10.1 01/17/2021 1443  ? PROT 6.4 01/17/2021 1443  ? ALBUMIN 4.2 07/18/2020 1155  ? AST 25 01/17/2021 1443  ? AST 24 07/18/2020 1155  ? ALT 38 (H) 01/17/2021 1443  ? ALT 36 07/18/2020 1155  ? ALKPHOS 48 07/18/2020 1155  ? BILITOT 0.4 01/17/2021 1443  ? BILITOT 0.3 07/18/2020 1155  ? GFRNONAA >60 07/18/2020 1155  ? GFRNONAA 71 06/14/2020 1426  ? GFRAA 82 06/14/2020 1426  ? ?Lab Results  ?Component Value Date  ? CHOL 188 11/13/2013  ? HDL 68 11/13/2013  ? LDLCALC 104 (H) 11/13/2013  ? TRIG 82 11/13/2013  ? CHOLHDL 2.8 11/13/2013  ? ?No results found for: HGBA1C ?No results found for: VITAMINB12 ?Lab Results  ?Component Value Date  ? TSH 1.684 11/13/2013  ? ? ? ? ?ASSESSMENT AND PLAN ?66 y.o. year old female  has a past medical history of Abnormal stress electrocardiogram test, Allergy, ANA positive, Atypical nevi, Depression, Headache(784.0), Hives (2011), Hyperlipidemia, Menopause, OSA (obstructive sleep apnea), Osteoarthritis, Right wrist fracture, Rosacea, Shingles (03/2012), Shortness of breath, Sleep apnea, and Urticaria. here with: ? ?OSA on BiPAP ? ?- BiPAP compliance excellent ?-Residual AHI remains elevated because the pressure was never adjusted.  A new order is sent to DME company to adjust pressure to 20/16 ?-Advised patient that if she has not heard from her DME company by Monday to let us know. once her pressure is changed she was advised to call back in 30 to 60 days and we will  do a download ?- FU in 6 months or sooner if needed ? ? ?Ward Givens, MSN, NP-C 05/10/2021, 10:06 AM ?[facility] ?[address] ?[place], [place] [address] ?([phone] ? ? ?

## 2021-05-10 NOTE — Patient Instructions (Signed)
Your Plan: ? ?Order sent to change pressure ?Call us if DME has not called by Monday ?Once pressure is changed please call 30-60 days for a download ?If your symptoms worsen or you develop new symptoms please let us know.  ? ?Thank you for coming to see [place] at [facility]. I hope we have been able to provide you high quality care today. ? ?You may receive a patient satisfaction survey over the next few weeks. We would appreciate your feedback and comments so that we may continue to improve ourselves and the health of our patients. ? ?

## 2021-05-11 DIAGNOSIS — F3341 Major depressive disorder, recurrent, in partial remission: Secondary | ICD-10-CM | POA: Diagnosis not present

## 2021-05-11 DIAGNOSIS — E785 Hyperlipidemia, unspecified: Secondary | ICD-10-CM | POA: Diagnosis not present

## 2021-05-18 DIAGNOSIS — F411 Generalized anxiety disorder: Secondary | ICD-10-CM | POA: Diagnosis not present

## 2021-05-18 DIAGNOSIS — F5101 Primary insomnia: Secondary | ICD-10-CM | POA: Diagnosis not present

## 2021-05-18 DIAGNOSIS — F061 Catatonic disorder due to known physiological condition: Secondary | ICD-10-CM | POA: Diagnosis not present

## 2021-05-18 DIAGNOSIS — F339 Major depressive disorder, recurrent, unspecified: Secondary | ICD-10-CM | POA: Diagnosis not present

## 2021-05-23 ENCOUNTER — Other Ambulatory Visit: Payer: Self-pay | Admitting: Physician Assistant

## 2021-05-23 DIAGNOSIS — M359 Systemic involvement of connective tissue, unspecified: Secondary | ICD-10-CM

## 2021-05-23 NOTE — Telephone Encounter (Signed)
Attempted to contact the patient and left message to advise patient we need her updated PLQ eye exam. Advised patient to call the office with this information.

## 2021-05-23 NOTE — Telephone Encounter (Signed)
Next Visit: 07/25/2021   Last Visit: 01/23/2021   Labs: 01/17/2021 CBC normal, ALT mildly elevated.     Eye exam:  04/14/2020    Current Dose per office note 01/23/2021: Plaquenil 200 mg 1 tablet by mouth daily   [id number] disease   Last Fill: 02/27/2021   Okay to refill Plaquenil?

## 2021-05-23 NOTE — Telephone Encounter (Signed)
Please clarify if the patient has had an updated plaquenil eye examination?

## 2021-05-25 ENCOUNTER — Other Ambulatory Visit: Payer: Self-pay | Admitting: *Deleted

## 2021-05-25 DIAGNOSIS — M8589 Other specified disorders of bone density and structure, multiple sites: Secondary | ICD-10-CM

## 2021-05-26 ENCOUNTER — Other Ambulatory Visit: Payer: Self-pay | Admitting: Physician Assistant

## 2021-05-26 DIAGNOSIS — M359 Systemic involvement of connective tissue, unspecified: Secondary | ICD-10-CM

## 2021-06-08 DIAGNOSIS — H40013 Open angle with borderline findings, low risk, bilateral: Secondary | ICD-10-CM | POA: Diagnosis not present

## 2021-06-08 DIAGNOSIS — H2513 Age-related nuclear cataract, bilateral: Secondary | ICD-10-CM | POA: Diagnosis not present

## 2021-06-08 DIAGNOSIS — H1789 Other corneal scars and opacities: Secondary | ICD-10-CM | POA: Diagnosis not present

## 2021-06-08 DIAGNOSIS — H349 Unspecified retinal vascular occlusion: Secondary | ICD-10-CM | POA: Diagnosis not present

## 2021-06-20 DIAGNOSIS — F3341 Major depressive disorder, recurrent, in partial remission: Secondary | ICD-10-CM | POA: Diagnosis not present

## 2021-06-20 DIAGNOSIS — G4733 Obstructive sleep apnea (adult) (pediatric): Secondary | ICD-10-CM | POA: Diagnosis not present

## 2021-06-20 DIAGNOSIS — Z0000 Encounter for general adult medical examination without abnormal findings: Secondary | ICD-10-CM | POA: Diagnosis not present

## 2021-06-20 DIAGNOSIS — E785 Hyperlipidemia, unspecified: Secondary | ICD-10-CM | POA: Diagnosis not present

## 2021-06-22 DIAGNOSIS — F339 Major depressive disorder, recurrent, unspecified: Secondary | ICD-10-CM | POA: Diagnosis not present

## 2021-06-22 DIAGNOSIS — F5101 Primary insomnia: Secondary | ICD-10-CM | POA: Diagnosis not present

## 2021-06-22 DIAGNOSIS — F061 Catatonic disorder due to known physiological condition: Secondary | ICD-10-CM | POA: Diagnosis not present

## 2021-06-22 DIAGNOSIS — F411 Generalized anxiety disorder: Secondary | ICD-10-CM | POA: Diagnosis not present

## 2021-06-27 DIAGNOSIS — Z85828 Personal history of other malignant neoplasm of skin: Secondary | ICD-10-CM | POA: Diagnosis not present

## 2021-06-27 DIAGNOSIS — D225 Melanocytic nevi of trunk: Secondary | ICD-10-CM | POA: Diagnosis not present

## 2021-06-27 DIAGNOSIS — Z86018 Personal history of other benign neoplasm: Secondary | ICD-10-CM | POA: Diagnosis not present

## 2021-06-27 DIAGNOSIS — D2271 Melanocytic nevi of right lower limb, including hip: Secondary | ICD-10-CM | POA: Diagnosis not present

## 2021-07-11 NOTE — Progress Notes (Signed)
Office Visit Note  Patient: Brianna Ross             Date of Birth: May 09, 1955           MRN: [id number]             PCP: Leeroy Cha, MD Referring: Leeroy Cha,* Visit Date: 07/25/2021 Occupation: '@GUAROCC'$ @  Subjective:  Medication management  History of Present Illness: Brianna Ross is a 66 y.o. female with history of autoimmune disease and osteoarthritis.  She denies any history of oral ulcers, nasal ulcers, malar rash, photosensitivity, Raynaud's phenomenon or lymphadenopathy.  She denies any history of joint pain.  She has been taking hydroxychloroquine 200 mg p.o. daily.  She is also on aspirin 325 mg daily.  She had her eye examination on June 08, 2021 which was within normal limits.  Activities of Daily Living:  Patient reports morning stiffness for 0 minutes.   Patient Denies nocturnal pain.  Difficulty dressing/grooming: Denies Difficulty climbing stairs: Denies Difficulty getting out of chair: Denies Difficulty using hands for taps, buttons, cutlery, and/or writing: Denies  Review of Systems  Constitutional:  Negative for fatigue.  HENT:  Negative for mouth sores and mouth dryness.   Eyes:  Negative for dryness.  Respiratory:  Negative for shortness of breath.   Cardiovascular:  Negative for chest pain and palpitations.  Gastrointestinal:  Negative for blood in stool, constipation and diarrhea.  Endocrine: Negative for increased urination.  Genitourinary:  Negative for involuntary urination.  Musculoskeletal:  Negative for joint pain, joint pain, joint swelling, myalgias, muscle weakness, morning stiffness, muscle tenderness and myalgias.  Skin:  Positive for hair loss. Negative for color change, rash and sensitivity to sunlight.  Allergic/Immunologic: Negative for susceptible to infections.  Neurological:  Negative for dizziness and headaches.  Hematological:  Negative for swollen glands.  Psychiatric/Behavioral:  Negative for depressed mood and  sleep disturbance. The patient is not nervous/anxious.     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 03/06/2013   Family history of heart disease 12/10/2012   Herpes zoster keratoconjunctivitis 11/12/2012   Positive ANA (antinuclear antibody) 03/06/2012   Elevated aldolase level 03/06/2012   Atypical nevi 11/14/2011   Menopause 11/13/2011   Rosacea 11/13/2011    Past Medical History:  Diagnosis Date   Abnormal stress electrocardiogram test    Allergy    ANA positive    Atypical nevi    Depression    Headache(784.0)    Hives 2011   x5   Hyperlipidemia    Menopause    OSA (obstructive sleep apnea)    Osteoarthritis    Right wrist fracture    Rosacea    Shingles 03/2012   Shortness of breath    Sleep apnea    Urticaria     Family History  Problem Relation Age of Onset   Glaucoma Mother    Hyperlipidemia Mother  Depression Mother    Osteoporosis Mother    Breast cancer Mother        78y   Dementia Mother    Cancer Mother    Heart disease Father    Hyperlipidemia Father    Hypertension Father    Heart defect Sister    Heart disease Sister 79       congenital heart defect,deceased   Hyperlipidemia Brother    Depression Brother    High Cholesterol Brother    Cancer Paternal Grandmother    Stroke Paternal Grandfather     Heart disease Paternal Grandfather    Past Surgical History:  Procedure Laterality Date   ORIF WRIST FRACTURE     thymic cyst     removal   WISDOM TOOTH EXTRACTION     Social History   Social History Narrative   Patient works for [employer].(full-time_    Patient lives at home with her husband Brianna Ross).   Patient does not have any children.   Patient is right-handed.   Patient drinks one cup of coffee everyday and two cups of tea daily.   Immunization History  Administered Date(s) Administered   Influenza Split 11/13/2011   Influenza,inj,Quad PF,6+ Mos 11/20/2016   Influenza-Unspecified 09/11/2013   Moderna Sars-Covid-2 Vaccination 02/25/2019, 03/25/2019, 11/24/2019   Pneumococcal Polysaccharide-23 11/12/2012     Objective: Vital Signs: BP 115/80 (BP Location: Left Arm, Patient Position: Sitting, Cuff Size: Normal)   Pulse 96   Ht 5' 4.5" (1.638 m)   Wt 164 lb 3.2 oz (74.5 kg)   BMI 27.75 kg/m    Physical Exam Vitals and nursing note reviewed.  Constitutional:      Appearance: She is well-developed.  HENT:     Head: Normocephalic and atraumatic.  Eyes:     Conjunctiva/sclera: Conjunctivae normal.  Cardiovascular:     Rate and Rhythm: Normal rate and regular rhythm.     Heart sounds: Normal heart sounds.  Pulmonary:     Effort: Pulmonary effort is normal.     Breath sounds: Normal breath sounds.  Abdominal:     General: Bowel sounds are normal.     Palpations: Abdomen is soft.  Musculoskeletal:     Cervical back: Normal range of motion.  Lymphadenopathy:     Cervical: No cervical adenopathy.  Skin:    General: Skin is warm and dry.     Capillary Refill: Capillary refill takes less than 2 seconds.  Neurological:     Mental Status: She is alert and oriented to person, place, and time.  Psychiatric:        Behavior: Behavior normal.      Musculoskeletal Exam: C-spine was in good range of motion.  Shoulder joints, elbow joints, wrist  joints, MCPs PIPs and DIPs and good range of motion with no synovitis.  Hip joints, knee joints, ankles, MTPs and PIPs with good range of motion with no synovitis.  CDAI Exam: CDAI Score: -- Patient Global: --; Provider Global: -- Swollen: --; Tender: -- Joint Exam 07/25/2021   No joint exam has been documented for this visit   There is currently no information documented on the homunculus. Go to the Rheumatology activity and complete the homunculus joint exam.  Investigation: No additional findings.  Imaging: No results found.  Recent Labs: Lab Results  Component Value Date   WBC 5.3 01/17/2021   HGB 13.9 01/17/2021   PLT 208 01/17/2021   NA 142 01/17/2021   K 3.8 01/17/2021   CL  105 01/17/2021   CO2 32 01/17/2021   GLUCOSE 85 01/17/2021   BUN 15 01/17/2021   CREATININE 0.85 01/17/2021   BILITOT 0.4 01/17/2021   ALKPHOS 48 07/18/2020   AST 25 01/17/2021   ALT 38 (H) 01/17/2021   PROT 6.4 01/17/2021   ALBUMIN 4.2 07/18/2020   CALCIUM 10.1 01/17/2021   GFRAA 82 06/14/2020     Speciality Comments: PLQ Eye Exam: 06/08/2021 @ [facility] Follow up in 1 year   Procedures:  No procedures performed Allergies: Patient has no known allergies.   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  She notices some hair loss.  There is no history of oral ulcers, nasal ulcers, malar rash, Raynaud's phenomenon, lymphadenopathy or photosensitivity.  She denies any history of inflammatory arthritis.  Her labs have been stable.  Her autoimmune labs and January 2023 showed increase in her anticardiolipin antibody titers.  Lupus anticoagulant was positive.  She has been taking hydroxychloroquine 200 mg p.o. daily along with aspirin 325 mg a day.  As her anticardiolipin antibody  is increased I would check her levels again today.  If her titers are still high I may increase the dose of hydroxychloroquine to 200 mg p.o. twice daily Monday to Friday.  High risk medication use - Plaquenil 200 mg 1 tablet by mouth daily. PLQ Eye Exam: 06/08/2021.  She has been advised to get labs every 5 months.  Labs obtained by her PCP on June 20, 2021 CBC WBC 5.9, hemoglobin 14.1, platelets 245, CMP normal creatinine 0.93, AST 25, ALT 40  Sicca syndrome (HCC)-she denies any sicca symptoms today.  Osteopenia of multiple sites - DEXA 08/10/2019 T-score: -2.3, BMD: 0.592 left femoral hip.  Use of calcium rich diet and vitamin D was discussed.  I will schedule repeat DEXA scan as its been more than 2 years.  Primary osteoarthritis of both hands-she has bilateral PIP and DIP thickening with no synovitis.  Primary osteoarthritis of both knees-she good range of motion without discomfort.  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  Follow-Up Instructions: Return in about 5 months (around 12/25/2021) for Autoimmune disease, Osteoarthritis.   Bo Merino, MD  Note - This record has been created using Editor, commissioning.  Chart creation errors have been sought, but may not always  have been located. Such creation errors do not reflect on  the standard of medical care.

## 2021-07-24 DIAGNOSIS — F5101 Primary insomnia: Secondary | ICD-10-CM | POA: Diagnosis not present

## 2021-07-24 DIAGNOSIS — F339 Major depressive disorder, recurrent, unspecified: Secondary | ICD-10-CM | POA: Diagnosis not present

## 2021-07-24 DIAGNOSIS — F061 Catatonic disorder due to known physiological condition: Secondary | ICD-10-CM | POA: Diagnosis not present

## 2021-07-24 DIAGNOSIS — F411 Generalized anxiety disorder: Secondary | ICD-10-CM | POA: Diagnosis not present

## 2021-07-25 ENCOUNTER — Encounter: Payer: Self-pay | Admitting: Rheumatology

## 2021-07-25 ENCOUNTER — Telehealth: Payer: Self-pay

## 2021-07-25 ENCOUNTER — Ambulatory Visit (INDEPENDENT_AMBULATORY_CARE_PROVIDER_SITE_OTHER): Payer: Medicare Other | Admitting: Rheumatology

## 2021-07-25 VITALS — BP 115/80 | HR 96 | Ht 64.5 in | Wt 164.2 lb

## 2021-07-25 DIAGNOSIS — Z79899 Other long term (current) drug therapy: Secondary | ICD-10-CM

## 2021-07-25 DIAGNOSIS — M359 Systemic involvement of connective tissue, unspecified: Secondary | ICD-10-CM

## 2021-07-25 DIAGNOSIS — R768 Other specified abnormal immunological findings in serum: Secondary | ICD-10-CM | POA: Diagnosis not present

## 2021-07-25 DIAGNOSIS — M3500 Sicca syndrome, unspecified: Secondary | ICD-10-CM

## 2021-07-25 DIAGNOSIS — M8589 Other specified disorders of bone density and structure, multiple sites: Secondary | ICD-10-CM | POA: Diagnosis not present

## 2021-07-25 DIAGNOSIS — F322 Major depressive disorder, single episode, severe without psychotic features: Secondary | ICD-10-CM

## 2021-07-25 DIAGNOSIS — Z8669 Personal history of other diseases of the nervous system and sense organs: Secondary | ICD-10-CM

## 2021-07-25 DIAGNOSIS — Z8639 Personal history of other endocrine, nutritional and metabolic disease: Secondary | ICD-10-CM

## 2021-07-25 DIAGNOSIS — M19042 Primary osteoarthritis, left hand: Secondary | ICD-10-CM

## 2021-07-25 DIAGNOSIS — L719 Rosacea, unspecified: Secondary | ICD-10-CM

## 2021-07-25 DIAGNOSIS — Z8619 Personal history of other infectious and parasitic diseases: Secondary | ICD-10-CM

## 2021-07-25 DIAGNOSIS — M19041 Primary osteoarthritis, right hand: Secondary | ICD-10-CM

## 2021-07-25 DIAGNOSIS — M170 Bilateral primary osteoarthritis of knee: Secondary | ICD-10-CM

## 2021-07-25 NOTE — Telephone Encounter (Signed)
Pending lab results (cardiolipin antibodies), Dr. Estanislado Pandy may increase patient's dose of PLQ. Thanks!

## 2021-08-03 LAB — PROTEIN / CREATININE RATIO, URINE
Creatinine, Urine: 21 mg/dL (ref 20–275)
Total Protein, Urine: <4 mg/dL — ABNORMAL LOW (ref 5–24)

## 2021-08-03 LAB — C3 AND C4
C3 Complement: 131 mg/dL (ref 83–193)
C4 Complement: 22 mg/dL (ref 15–57)

## 2021-08-03 LAB — CARDIOLIPIN ANTIBODIES, IGG, IGM, IGA
Anticardiolipin IgA: >65 APL-U/mL — ABNORMAL HIGH (ref <20.0)
Anticardiolipin IgG: 70.3 GPL-U/mL — ABNORMAL HIGH (ref <20.0)
Anticardiolipin IgM: 6.6 MPL-U/mL (ref <20.0)

## 2021-08-03 LAB — THROMBIN CLOTTING TIME: Thrombin Clotting Time: 18 s (ref 13–19)

## 2021-08-03 LAB — LUPUS ANTICOAGULANT EVAL W/ REFLEX
PTT-LA Screen: 41 s — ABNORMAL HIGH (ref <=40)
dRVVT: 54 s — ABNORMAL HIGH (ref <=45)

## 2021-08-03 LAB — RFLX HEXAGONAL PHASE CONFIRM: Hexagonal Phase Conf: POSITIVE — AB

## 2021-08-03 LAB — SEDIMENTATION RATE: Sed Rate: 2 mm/h (ref 0–30)

## 2021-08-03 LAB — ANTI-DNA ANTIBODY, DOUBLE-STRANDED: ds DNA Ab: 1 IU/mL

## 2021-08-03 LAB — RFX DRVVT 1:1 MIX: PT, Mixing Interp: POSITIVE — AB

## 2021-08-03 LAB — RFLX DRVVT CONFRIM: DRVVT CONFIRM: POSITIVE — AB

## 2021-08-04 NOTE — Progress Notes (Signed)
Anticardiolipin antibodies and lupus anticoagulant are positive.  UA negative for protein, complements normal, double-stranded DNA negative, sed rate normal.  Continue current treatment.

## 2021-08-04 NOTE — Telephone Encounter (Signed)
Anticardiolipin antibodies and lupus anticoagulant are positive.  UA negative for protein, complements normal, double-stranded DNA negative, sed rate normal.  Continue current treatment

## 2021-08-10 DIAGNOSIS — M8589 Other specified disorders of bone density and structure, multiple sites: Secondary | ICD-10-CM | POA: Diagnosis not present

## 2021-08-10 DIAGNOSIS — Z780 Asymptomatic menopausal state: Secondary | ICD-10-CM | POA: Diagnosis not present

## 2021-08-15 ENCOUNTER — Telehealth: Payer: Self-pay | Admitting: *Deleted

## 2021-08-15 NOTE — Telephone Encounter (Signed)
Received DEXA results from [facility].  Date of Scan: 08/10/2021  Lowest T-score:-2.2  BMD:0.601  Lowest site measured:Right Femoral Neck  DX: Osteopenia  Significant changes in BMD and site measured (5% and above):-6% AP Spine  Current Regimen:Calcium, Vitamin D   Recommendation:Will discuss results at follow up visit.   Reviewed by:Dr. Bo Merino   Next Appointment:  01/02/2022

## 2021-08-18 DIAGNOSIS — G4733 Obstructive sleep apnea (adult) (pediatric): Secondary | ICD-10-CM | POA: Diagnosis not present

## 2021-08-21 ENCOUNTER — Other Ambulatory Visit: Payer: Self-pay | Admitting: Physician Assistant

## 2021-08-21 DIAGNOSIS — M359 Systemic involvement of connective tissue, unspecified: Secondary | ICD-10-CM

## 2021-08-21 NOTE — Telephone Encounter (Signed)
Next Visit: 01/02/2022  Last Visit: 07/25/2021  Labs: June 20, 2021 CBC WBC 5.9, hemoglobin 14.1, platelets 245, CMP normal creatinine 0.93, AST 25, ALT 40  Eye exam:  06/08/2021 WNL  Current Dose per office note 07/25/2021: Plaquenil 200 mg 1 tablet by mouth daily  [id number] disease   Last Fill: 05/23/2021  Okay to refill Plaquenil?

## 2021-08-24 DIAGNOSIS — F5101 Primary insomnia: Secondary | ICD-10-CM | POA: Diagnosis not present

## 2021-08-24 DIAGNOSIS — F411 Generalized anxiety disorder: Secondary | ICD-10-CM | POA: Diagnosis not present

## 2021-08-24 DIAGNOSIS — F339 Major depressive disorder, recurrent, unspecified: Secondary | ICD-10-CM | POA: Diagnosis not present

## 2021-08-24 DIAGNOSIS — F061 Catatonic disorder due to known physiological condition: Secondary | ICD-10-CM | POA: Diagnosis not present

## 2021-09-05 ENCOUNTER — Telehealth: Payer: Self-pay | Admitting: Rheumatology

## 2021-09-05 NOTE — Telephone Encounter (Signed)
Patient called the office stating she received a letter from [facility] that Brianna Ross is no longer in network with her insurance. Patient states Dr. Estanislado Pandy is still in network per her insurance. Please advise.

## 2021-10-12 ENCOUNTER — Telehealth: Payer: Self-pay | Admitting: Adult Health

## 2021-10-12 DIAGNOSIS — G4733 Obstructive sleep apnea (adult) (pediatric): Secondary | ICD-10-CM

## 2021-10-12 NOTE — Telephone Encounter (Signed)
Had sleep test 11-27-2020.  Started order.

## 2021-10-12 NOTE — Telephone Encounter (Signed)
Pt called stating that her Bipap stopped working and her DME informed her that the provider needs to fax over an order for a new one to Fax # [phone] or [phone] Please advise.

## 2021-10-23 ENCOUNTER — Other Ambulatory Visit: Payer: Self-pay | Admitting: *Deleted

## 2021-10-23 ENCOUNTER — Other Ambulatory Visit: Payer: Self-pay | Admitting: Rheumatology

## 2021-10-23 DIAGNOSIS — M359 Systemic involvement of connective tissue, unspecified: Secondary | ICD-10-CM

## 2021-10-23 DIAGNOSIS — G4733 Obstructive sleep apnea (adult) (pediatric): Secondary | ICD-10-CM

## 2021-10-23 MED ORDER — HYDROXYCHLOROQUINE SULFATE 200 MG PO TABS: ORAL_TABLET | ORAL | 0 refills | Status: DC

## 2021-10-23 NOTE — Telephone Encounter (Signed)
Patient left a voicemail stating she called the pharmacy requesting prescription refill of Plaquenil.  Patient was told she was requesting the refill 4 weeks early.  Patient states she received her last prescription on 08/21/21 and the bottle said it was filled for 90 tablets, but she only has 6 tablets remaining.  Patient states she doesn't know what happened because she has been taking 1 tablet per day for years and always takes it in the morning with her other medication.  Patient requested a return call.

## 2021-10-23 NOTE — Telephone Encounter (Signed)
Returned call to patient and she states she has searched her medicine cabinet and she does not have any extra PLQ. Patient states her bottle of PLQ states she was dispensed 90 tablets but she does not have enough medication to last until next month. Patient is willing to pay out of pocket.   Next Visit: 01/02/2022  Last Visit: 07/25/2021  Labs: June 20, 2021 CBC WBC 5.9, hemoglobin 14.1, platelets 245, CMP normal creatinine 0.93, AST 25, ALT 40  Eye exam: 06/08/2021 WNL   Current Dose per office note 07/25/2021:  Plaquenil 200 mg 1 tablet by mouth daily  [id number] disease  Last Fill: 08/21/2021  Okay to refill Plaquenil?

## 2021-10-24 DIAGNOSIS — F061 Catatonic disorder due to known physiological condition: Secondary | ICD-10-CM | POA: Diagnosis not present

## 2021-10-24 DIAGNOSIS — F339 Major depressive disorder, recurrent, unspecified: Secondary | ICD-10-CM | POA: Diagnosis not present

## 2021-10-24 DIAGNOSIS — F411 Generalized anxiety disorder: Secondary | ICD-10-CM | POA: Diagnosis not present

## 2021-10-24 DIAGNOSIS — F5101 Primary insomnia: Secondary | ICD-10-CM | POA: Diagnosis not present

## 2021-11-02 NOTE — Telephone Encounter (Signed)
New, Willodean Rosenthal, RN; Redmond Pulling, Jake Shark Received, Thank you!     Previous Messages    ----- Message ----- From: Brandon Melnick, RN Sent: 11/01/2021   8:21 AM EDT To: Darlina Guys; Miquel Dunn; Nash Shearer; * Subject: resmed, mask, bipap pressure                  Did you all get the order for [facility] 10-23-2021   [facility] Female, 66 y.o., 1955/03/28  MRN: [id number]  [place]

## 2021-11-07 DIAGNOSIS — E785 Hyperlipidemia, unspecified: Secondary | ICD-10-CM | POA: Diagnosis not present

## 2021-11-07 DIAGNOSIS — F3341 Major depressive disorder, recurrent, in partial remission: Secondary | ICD-10-CM | POA: Diagnosis not present

## 2021-11-15 ENCOUNTER — Ambulatory Visit: Payer: Medicare Other | Admitting: Adult Health

## 2021-11-15 ENCOUNTER — Encounter: Payer: Self-pay | Admitting: Adult Health

## 2021-11-15 ENCOUNTER — Telehealth: Payer: Self-pay | Admitting: *Deleted

## 2021-11-15 VITALS — BP 118/80 | HR 86 | Ht 64.0 in | Wt 172.0 lb

## 2021-11-15 DIAGNOSIS — G4733 Obstructive sleep apnea (adult) (pediatric): Secondary | ICD-10-CM | POA: Diagnosis not present

## 2021-11-15 NOTE — Patient Instructions (Signed)
Continue using BiPAP nightly and greater than 4 hours each night Order sent for new machine If your symptoms worsen or you develop new symptoms please let us know.

## 2021-11-15 NOTE — Progress Notes (Signed)
PATIENT: Brianna Ross DOB: [birth year]  REASON FOR VISIT: follow up HISTORY FROM: patient PRIMARY NEUROLOGIST: Dr. Brett Fairy  Chief Complaint  Patient presents with   Follow-up    Pt in 34  Pt wants a new CPAP machine Pt set up date 02/29/2012      HISTORY OF PRESENT ILLNESS: Today 11/15/21: Brianna Ross is a 66 year old female with a history of obstructive sleep apnea on BiPAP.  She returns today for follow-up.  She reports that BiPAP is working well.  She states that her machine is over 39 years old and would like a new machine.  Looks like an order was placed in October however she states DME has not contacted her about this.  Download is below.   05/10/21: Brianna Ross is a 66 year old female with a history of obstructive sleep apnea on BiPAP.  She returns today for follow-up.  At the last visit she was sent for BiPAP titration and an order was sent to her DME company to adjust her pressure however it appears this was never done.  Patient's download is below    08/11/20: Brianna Ross is a 66 year old female with a history of obstructive sleep apnea on BiPAP.  Her download indicates that he uses machine 29 out of 30 days for compliance of 97%.  She uses machine greater than 4 hours 26 days for compliance of 87%.  On average she uses her machine 7 hours and 52 minutes.  Her residual AHI is 30.5 on 16/10 centimeters of water her leak in the 95th percentile is 84.4 L/min.  The patient states that she has a [employer] fullface DreamWear mask she does not like the air blowing in her nostrils as she finds it very uncomfortable.    REVIEW OF SYSTEMS: Out of a complete 14 system review of symptoms, the patient complains only of the following symptoms, and all other reviewed systems are negative.   ESS 3  ALLERGIES: No Known Allergies  HOME MEDICATIONS: Outpatient Medications Prior to Visit  Medication Sig Dispense Refill   acetaminophen (TYLENOL) 500 MG tablet Take 500 mg by mouth 2 (two) times  daily as needed (pain).     aspirin 325 MG EC tablet Take 325 mg by mouth daily.     buPROPion (WELLBUTRIN XL) 150 MG 24 hr tablet Take 450 mg by mouth daily.     calcium carbonate (OS-CAL) 600 MG tablet Take 600 mg by mouth in the morning and at bedtime.     Cholecalciferol (VITAMIN D) 2000 units tablet Take 4,000 Units by mouth daily.      clonazePAM (KLONOPIN) 0.5 MG tablet Take 1 mg by mouth at bedtime.      diclofenac sodium (VOLTAREN) 1 % GEL Apply 3 g to 3 large joints up to 3 times daily. 3 Tube 3   fluorometholone (FML) 0.1 % ophthalmic suspension Place 1 drop into the left eye daily.  5   hydroxychloroquine (PLAQUENIL) 200 MG tablet TAKE 1 TABLET BY MOUTH DAILY 90 tablet 0   NONFORMULARY OR COMPOUNDED ITEM daily. Rosacea cream     OLANZapine (ZYPREXA) 10 MG tablet Take 10 mg by mouth daily.     rosuvastatin (CRESTOR) 20 MG tablet Take 20 mg by mouth daily.     venlafaxine XR (EFFEXOR-XR) 150 MG 24 hr capsule Take 300 mg by mouth daily with breakfast.     No facility-administered medications prior to visit.    PAST MEDICAL HISTORY: Past Medical History:  Diagnosis Date  Abnormal stress electrocardiogram test    Allergy    ANA positive    Atypical nevi    Depression    Headache(784.0)    Hives 2011   x5   Hyperlipidemia    Menopause    OSA (obstructive sleep apnea)    Osteoarthritis    Right wrist fracture    Rosacea    Shingles 03/2012   Shortness of breath    Sleep apnea    Urticaria     PAST SURGICAL HISTORY: Past Surgical History:  Procedure Laterality Date   ORIF WRIST FRACTURE     thymic cyst     removal   WISDOM TOOTH EXTRACTION      FAMILY HISTORY: Family History  Problem Relation Age of Onset   Glaucoma Mother    Hyperlipidemia Mother    Depression Mother    Osteoporosis Mother    Breast cancer Mother        44y   Dementia Mother    Cancer Mother    Heart disease Father    Hyperlipidemia Father    Hypertension Father    Heart defect  Sister    Heart disease Sister 36       congenital heart defect,deceased   Hyperlipidemia Brother    Depression Brother    High Cholesterol Brother    Cancer Paternal Grandmother    Stroke Paternal Grandfather    Heart disease Paternal Grandfather     SOCIAL HISTORY: Social History   Socioeconomic History   Marital status: Married    Spouse name: Sonny   Number of children: 0   Years of education: 53   Highest education level: Not on file  Occupational History    Employer: [employer]  Tobacco Use   Smoking status: Never    Passive exposure: Never   Smokeless tobacco: Never  Vaping Use   Vaping Use: Never used  Substance and Sexual Activity   Alcohol use: No   Drug use: Never   Sexual activity: Not Currently    Birth control/protection: Post-menopausal  Other Topics Concern   Not on file  Social History Narrative   Patient works for [employer].(full-time_    Patient lives at home with her husband [name]).   Patient does not have any children.   Patient is right-handed.   Patient drinks one cup of coffee everyday and two cups of tea daily.   Social Determinants of Health   Financial Resource Strain: Not on file  Food Insecurity: Not on file  Transportation Needs: Not on file  Physical Activity: Not on file  Stress: Not on file  Social Connections: Not on file  Intimate Partner Violence: Not on file      PHYSICAL EXAM  Vitals:   11/15/21 1126  BP: 118/80  Pulse: 86  Weight: 172 lb (78 kg)  Height: '5\' 4"'$  (1.626 m)    Body mass index is 29.52 kg/m.  Generalized: Well developed, in no acute distress  Chest: Lungs clear to auscultation bilaterally  Neurological examination  Mentation: Alert oriented to time, place, history taking. Follows all commands speech and language fluent Cranial nerve II-XII: Extraocular movements were full, visual field were full on confrontational test Head turning and shoulder shrug   were normal and symmetric. Motor: The motor testing reveals 5 over 5 strength of all 4 extremities.  Gait and station: Gait is normal.    DIAGNOSTIC DATA (LABS, IMAGING, TESTING) - I reviewed patient records, labs, notes, testing and  imaging myself where available.  Lab Results  Component Value Date   WBC 5.3 01/17/2021   HGB 13.9 01/17/2021   HCT 41.6 01/17/2021   MCV 90.6 01/17/2021   PLT 208 01/17/2021      Component Value Date/Time   NA 142 01/17/2021 1443   K 3.8 01/17/2021 1443   CL 105 01/17/2021 1443   CO2 32 01/17/2021 1443   GLUCOSE 85 01/17/2021 1443   BUN 15 01/17/2021 1443   CREATININE 0.85 01/17/2021 1443   CALCIUM 10.1 01/17/2021 1443   PROT 6.4 01/17/2021 1443   ALBUMIN 4.2 07/18/2020 1155   AST 25 01/17/2021 1443   AST 24 07/18/2020 1155   ALT 38 (H) 01/17/2021 1443   ALT 36 07/18/2020 1155   ALKPHOS 48 07/18/2020 1155   BILITOT 0.4 01/17/2021 1443   BILITOT 0.3 07/18/2020 1155   GFRNONAA >60 07/18/2020 1155   GFRNONAA 71 06/14/2020 1426   GFRAA 82 06/14/2020 1426   Lab Results  Component Value Date   CHOL 188 11/13/2013   HDL 68 11/13/2013   LDLCALC 104 (H) 11/13/2013   TRIG 82 11/13/2013   CHOLHDL 2.8 11/13/2013   No results found for: "HGBA1C" No results found for: "VITAMINB12" Lab Results  Component Value Date   TSH 1.684 11/13/2013      ASSESSMENT AND PLAN 66 y.o. year old female  has a past medical history of Abnormal stress electrocardiogram test, Allergy, ANA positive, Atypical nevi, Depression, Headache(784.0), Hives (2011), Hyperlipidemia, Menopause, OSA (obstructive sleep apnea), Osteoarthritis, Right wrist fracture, Rosacea, Shingles (03/2012), Shortness of breath, Sleep apnea, and Urticaria. here with:  OSA on BiPAP  - BiPAP compliance excellent -Residual AHI improved  -Continue using BIPAP nightly and > 4 hours each night -Order placed for new machine--RN will call and see why she has not heard about a machine since order  was placed in October as well - FU in 6 months or sooner if needed   Ward Givens, MSN, NP-C 11/15/2021, 11:21 AM [facility] [address], [place], [place] [address] [phone]

## 2021-11-16 NOTE — Telephone Encounter (Signed)
Pt has been in touch with her DME.  She will call me back if needed.

## 2021-11-29 ENCOUNTER — Encounter: Payer: Self-pay | Admitting: Adult Health

## 2021-11-29 NOTE — Telephone Encounter (Signed)
I sent a message to [employer] to look into this.

## 2021-11-29 NOTE — Telephone Encounter (Signed)
Addressed in other encounter 

## 2021-11-30 NOTE — Telephone Encounter (Signed)
Also received this response from [place] w/ [employer]:

## 2021-12-01 DIAGNOSIS — F061 Catatonic disorder due to known physiological condition: Secondary | ICD-10-CM | POA: Diagnosis not present

## 2021-12-01 DIAGNOSIS — F411 Generalized anxiety disorder: Secondary | ICD-10-CM | POA: Diagnosis not present

## 2021-12-01 DIAGNOSIS — F339 Major depressive disorder, recurrent, unspecified: Secondary | ICD-10-CM | POA: Diagnosis not present

## 2021-12-01 DIAGNOSIS — F5101 Primary insomnia: Secondary | ICD-10-CM | POA: Diagnosis not present

## 2021-12-04 ENCOUNTER — Encounter: Payer: Self-pay | Admitting: Adult Health

## 2021-12-04 NOTE — Telephone Encounter (Signed)
Yes ok to do

## 2021-12-05 ENCOUNTER — Encounter: Payer: Self-pay | Admitting: *Deleted

## 2021-12-05 NOTE — Telephone Encounter (Signed)
Received this additional message from [place] w/ [employer]:  "I pulled air view, Looks like patient has an old S9 vpap unit and the settings are Epap 16.0 Ipap 20.0   Device  S9 VPAP Auto  Serial number  [id number]  Added  03/28/2012  Status  No changes pending    Details  ModeSpont  EPAP (cmH2O)16.0  IPAP (cmH2O)20.0  Easy-BreatheOn"

## 2021-12-06 DIAGNOSIS — G4733 Obstructive sleep apnea (adult) (pediatric): Secondary | ICD-10-CM | POA: Diagnosis not present

## 2021-12-07 DIAGNOSIS — H349 Unspecified retinal vascular occlusion: Secondary | ICD-10-CM | POA: Diagnosis not present

## 2021-12-07 DIAGNOSIS — H1789 Other corneal scars and opacities: Secondary | ICD-10-CM | POA: Diagnosis not present

## 2021-12-07 DIAGNOSIS — H25042 Posterior subcapsular polar age-related cataract, left eye: Secondary | ICD-10-CM | POA: Diagnosis not present

## 2021-12-07 DIAGNOSIS — H2513 Age-related nuclear cataract, bilateral: Secondary | ICD-10-CM | POA: Diagnosis not present

## 2021-12-07 NOTE — Telephone Encounter (Signed)
Letter done, signed and faxed to [place]  [phone].  (Confirmed by fax).

## 2021-12-08 ENCOUNTER — Encounter: Payer: Self-pay | Admitting: Adult Health

## 2021-12-19 NOTE — Progress Notes (Signed)
Office Visit Note  Patient: Brianna Ross             Date of Birth: [birth year]           MRN: [id number]             PCP: Leeroy Cha, MD Referring: Leeroy Cha,* Visit Date: 01/02/2022 Occupation: _0 @  Subjective:  Discuss DEXA results   History of Present Illness: Khrystyna Schwalm is a 66 y.o. female with history of autoimmune disease, osteoarthritis, and osteopenia.  She is taking plaquenil 200 mg 1 tablet by mouth daily.  She is tolerating Plaquenil without any side effects and has not missed any doses recently.  She denies any signs or symptoms of an autoimmune disease flare.  She has not had any symptoms of Raynaud's phenomenon, oral or nasal ulcerations, sicca symptoms, cervical lymphadenopathy, facial rashes, shortness of breath, pleuritic chest pain, or joint inflammation.  She denies any increased joint pain recently.  She has not had any morning stiffness or difficulty with ADLs.  She denies any recent falls or fractures.  She continues to take a calcium vitamin D supplement daily. She denies any recent or recurrent infections.  She denies any new medical conditions.     Activities of Daily Living:  Patient reports morning stiffness for 0 minutes.   Patient Reports nocturnal pain.  Difficulty dressing/grooming: Denies Difficulty climbing stairs: Denies Difficulty getting out of chair: Denies Difficulty using hands for taps, buttons, cutlery, and/or writing: Denies  Review of Systems  Constitutional:  Negative for fatigue.  HENT:  Negative for mouth sores and mouth dryness.   Eyes:  Negative for dryness.  Respiratory:  Negative for shortness of breath.   Cardiovascular:  Negative for chest pain and palpitations.  Gastrointestinal:  Negative for blood in stool, constipation and diarrhea.  Endocrine: Negative for increased urination.  Genitourinary:  Negative for involuntary urination.  Musculoskeletal:  Positive for joint pain and joint pain. Negative  for gait problem, joint swelling, myalgias, muscle weakness, morning stiffness, muscle tenderness and myalgias.  Skin:  Negative for color change, rash, hair loss and sensitivity to sunlight.  Allergic/Immunologic: Negative for susceptible to infections.  Neurological:  Negative for dizziness and headaches.  Hematological:  Negative for swollen glands.  Psychiatric/Behavioral:  Negative for depressed mood and sleep disturbance. The patient is not nervous/anxious.     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   Positive ANA (antinuclear antibody) 03/06/2012   Elevated aldolase level 03/06/2012   Atypical nevi 11/14/2011   Menopause 11/13/2011   Rosacea 11/13/2011    Past Medical History:  Diagnosis Date   Abnormal stress electrocardiogram test    Allergy    ANA positive    Atypical nevi    Depression    Headache(784.0)    Hives 2011  x5   Hyperlipidemia    Menopause    OSA (obstructive sleep apnea)    Osteoarthritis    Right wrist fracture    Rosacea    Shingles 03/2012   Shortness of breath    Sleep apnea    Urticaria     Family History  Problem Relation Age of Onset   Glaucoma Mother    Hyperlipidemia Mother     Depression Mother    Osteoporosis Mother    Breast cancer Mother        1y   Dementia Mother    Cancer Mother    Heart disease Father    Hyperlipidemia Father    Hypertension Father    Heart defect Sister    Heart disease Sister 82       congenital heart defect,deceased   Hyperlipidemia Brother    Depression Brother    High Cholesterol Brother    Sleep apnea Brother    Cancer Paternal Grandmother    Stroke Paternal Grandfather    Heart disease Paternal Grandfather    Past Surgical History:  Procedure Laterality Date   ORIF WRIST FRACTURE     thymic cyst     removal   WISDOM TOOTH EXTRACTION     Social History   Social History Narrative   Patient works for [employer].(full-time_    Patient lives at home with her husband [name]).   Patient does not have any children.   Patient is right-handed.   Patient drinks one cup of coffee everyday and two cups of tea daily.   Immunization History  Administered Date(s) Administered   Influenza Split 11/13/2011   Influenza,inj,Quad PF,6+ Mos 11/20/2016   Influenza-Unspecified 09/11/2013   Moderna Sars-Covid-2 Vaccination 02/25/2019, 03/25/2019, 11/24/2019   Pneumococcal Polysaccharide-23 11/12/2012     Objective: Vital Signs: BP 109/76 (BP Location: Left Arm, Patient Position: Sitting, Cuff Size: Normal)   Pulse 94   Resp 13   Ht 5' 4.5" (1.638 m)   Wt 175 lb 6.4 oz (79.6 kg)   BMI 29.64 kg/m    Physical Exam Vitals and nursing note reviewed.  Constitutional:      Appearance: She is well-developed.  HENT:     Head: Normocephalic and atraumatic.  Eyes:     Conjunctiva/sclera: Conjunctivae normal.  Cardiovascular:     Rate and Rhythm: Normal rate and regular rhythm.     Heart sounds: Normal heart sounds.  Pulmonary:     Effort: Pulmonary effort is normal.     Breath sounds: Normal breath sounds.  Abdominal:     General: Bowel sounds are normal.     Palpations: Abdomen is soft.   Musculoskeletal:     Cervical back: Normal range of motion.  Skin:    General: Skin is warm and dry.     Capillary Refill: Capillary refill takes less than 2 seconds.  Neurological:     Mental Status: She is alert and oriented to person, place, and time.  Psychiatric:        Behavior: Behavior normal.      Musculoskeletal Exam: C-spine has limited range of motion with lateral rotation especially to the left.  Good flexion extension of the C-spine.  No midline spinal tenderness or SI joint tenderness.  Shoulder joints, elbow joints, wrist joints, MCPs, PIPs, DIPs have good range of motion with no synovitis.  DIP thickening consistent with osteoarthritis of both hands.  Hip joints have good range of motion with no groin pain.  Knee  joints have good range of motion with no warmth or effusion.  Ankle joints have good range of motion with no tenderness or joint swelling.  CDAI Exam: CDAI Score: -- Patient Global: --; Provider Global: -- Swollen: --; Tender: -- Joint Exam 01/02/2022   No joint exam has been documented for this visit   There is currently no information documented on the homunculus. Go to the Rheumatology activity and complete the homunculus joint exam.  Investigation: No additional findings.  Imaging: No results found.  Recent Labs: Lab Results  Component Value Date   WBC 5.3 01/17/2021   HGB 13.9 01/17/2021   PLT 208 01/17/2021   NA 142 01/17/2021   K 3.8 01/17/2021   CL 105 01/17/2021   CO2 32 01/17/2021   GLUCOSE 85 01/17/2021   BUN 15 01/17/2021   CREATININE 0.85 01/17/2021   BILITOT 0.4 01/17/2021   ALKPHOS 48 07/18/2020   AST 25 01/17/2021   ALT 38 (H) 01/17/2021   PROT 6.4 01/17/2021   ALBUMIN 4.2 07/18/2020   CALCIUM 10.1 01/17/2021   GFRAA 82 06/14/2020    Speciality Comments: PLQ Eye Exam: 06/08/2021 WNL @ [facility] Follow up in 1 year   Procedures:  No procedures performed Allergies: Patient has no known allergies.      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  She has clinically been doing well taking Plaquenil 200 mg 1 tablet by mouth daily.  She is tolerating Plaquenil without any side effects and has not missed any doses recently.  She has not had any symptoms of Raynaud's phenomenon, oral or nasal ulcerations, sicca symptoms, cervical lymphadenopathy, malar rash, hair loss, shortness of breath, pleuritic chest pain, palpitations, or joint inflammation. Lab work from 07/25/21 was reviewed today in the office: lupus anticoagulant was detected, ESR 2, anticardiolipin IgA >65, anticardiolipin IgG 70,3, dsDNA is negative, complements WNL, and protein creatinine ratio WNL.   The following lab work will be rechecked today.  She will remain on Plaquenil as prescribed.  A refill of Plaquenil was sent to the pharmacy today.  She was advised to notify us if she develops signs or symptoms of a flare.  She will follow-up in the office in 5 months or sooner if needed. - Plan: CBC with Differential/Platelet, COMPLETE METABOLIC PANEL WITH GFR, Sedimentation rate, C3 and C4, Anti-DNA antibody, double-stranded, ANA, Urinalysis, Routine w reflex microscopic, Beta-2 glycoprotein antibodies, Cardiolipin antibodies, IgG, IgM, IgA, Lupus Anticoagulant Eval w/Reflex, hydroxychloroquine (PLAQUENIL) 200 MG tablet  High risk medication use - Plaquenil 200 mg 1 tablet by mouth daily. Orders for CBC and CMP released today.  PLQ Eye Exam: 06/08/2021 WNL @ 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   The following lab work will be obtained today for further evaluation.  She will remain on Plaquenil as prescribed.  Plan: Beta-2 glycoprotein antibodies, Cardiolipin antibodies, IgG, IgM, IgA, Lupus Anticoagulant Eval w/Reflex  Sicca syndrome ([place]): She is not currently experiencing any sicca symptoms.  No cervical lymphadenopathy.  Osteopenia of multiple sites - DEXA updated on 08/10/2021: Right femoral neck BMD 0.601 with T-score -2.2.  Statistically significant decrease  in BMD of the AP spine.  Stable bilateral hips.  Results were discussed with the patient today in detail.  All questions were addressed. Previous DEXA 08/10/2019 T-score: -2.3, BMD: 0.592 left femoral hip.  Discussed the importance of taking calcium and vitamin D on a daily basis as recommended.  She was given an informational handout about vitamin D and calcium supplementation.  Also discussed the importance of performing resistive exercises.  Her next DEXA will be due in August 2025.  Primary osteoarthritis of both hands: She has PIP and DIP thickening consistent with osteoarthritis of both hands.  No tenderness or inflammation noted on examination today.  Complete fist formation bilaterally.  Primary osteoarthritis of both knees: She has good range of motion of both knee joints on examination today.  No warmth or effusion noted.  She has occasional discomfort in the right knee at night.  No mechanical symptoms.  Discussed the importance of performing lower extremity muscle strengthening exercises.  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[place])  History of hyperlipidemia    Orders: Orders Placed This Encounter  Procedures   CBC with Differential/Platelet   COMPLETE METABOLIC PANEL WITH GFR   Sedimentation rate   C3 and C4   Anti-DNA antibody, double-stranded   ANA    Urinalysis, Routine w reflex microscopic   Beta-2 glycoprotein antibodies   Cardiolipin antibodies, IgG, IgM, IgA   Lupus Anticoagulant Eval w/Reflex   Meds ordered this encounter  Medications   hydroxychloroquine (PLAQUENIL) 200 MG tablet    Sig: TAKE 1 TABLET BY MOUTH DAILY    Dispense:  90 tablet    Refill:  0    Follow-Up Instructions: Return in about 5 months (around 06/03/2022) for Autoimmune Disease, Osteoarthritis.   Ofilia Neas, PA-C  Note - This record has been created using Dragon software.  Chart creation errors have been sought, but may not always  have been located. Such creation errors do not reflect on  the standard of medical care.

## 2022-01-01 DIAGNOSIS — L900 Lichen sclerosus et atrophicus: Secondary | ICD-10-CM

## 2022-01-01 HISTORY — DX: Lichen sclerosus et atrophicus: L90.0

## 2022-01-02 ENCOUNTER — Ambulatory Visit: Payer: Medicare Other | Attending: Physician Assistant | Admitting: Physician Assistant

## 2022-01-02 ENCOUNTER — Encounter: Payer: Self-pay | Admitting: Physician Assistant

## 2022-01-02 VITALS — BP 109/76 | HR 94 | Resp 13 | Ht 64.5 in | Wt 175.4 lb

## 2022-01-02 DIAGNOSIS — Z8619 Personal history of other infectious and parasitic diseases: Secondary | ICD-10-CM

## 2022-01-02 DIAGNOSIS — M19041 Primary osteoarthritis, right hand: Secondary | ICD-10-CM

## 2022-01-02 DIAGNOSIS — F322 Major depressive disorder, single episode, severe without psychotic features: Secondary | ICD-10-CM

## 2022-01-02 DIAGNOSIS — Z8669 Personal history of other diseases of the nervous system and sense organs: Secondary | ICD-10-CM

## 2022-01-02 DIAGNOSIS — M3500 Sicca syndrome, unspecified: Secondary | ICD-10-CM | POA: Diagnosis not present

## 2022-01-02 DIAGNOSIS — L719 Rosacea, unspecified: Secondary | ICD-10-CM

## 2022-01-02 DIAGNOSIS — M359 Systemic involvement of connective tissue, unspecified: Secondary | ICD-10-CM

## 2022-01-02 DIAGNOSIS — R760 Raised antibody titer: Secondary | ICD-10-CM | POA: Diagnosis not present

## 2022-01-02 DIAGNOSIS — M8589 Other specified disorders of bone density and structure, multiple sites: Secondary | ICD-10-CM | POA: Diagnosis not present

## 2022-01-02 DIAGNOSIS — Z8639 Personal history of other endocrine, nutritional and metabolic disease: Secondary | ICD-10-CM

## 2022-01-02 DIAGNOSIS — M19042 Primary osteoarthritis, left hand: Secondary | ICD-10-CM

## 2022-01-02 DIAGNOSIS — R768 Other specified abnormal immunological findings in serum: Secondary | ICD-10-CM | POA: Diagnosis not present

## 2022-01-02 DIAGNOSIS — Z79899 Other long term (current) drug therapy: Secondary | ICD-10-CM | POA: Diagnosis not present

## 2022-01-02 DIAGNOSIS — M170 Bilateral primary osteoarthritis of knee: Secondary | ICD-10-CM

## 2022-01-02 MED ORDER — HYDROXYCHLOROQUINE SULFATE 200 MG PO TABS: ORAL_TABLET | ORAL | 0 refills | Status: DC

## 2022-01-03 NOTE — Progress Notes (Signed)
dsDNA is negative.  Labs are not consistent with a flare.

## 2022-01-03 NOTE — Progress Notes (Signed)
ALT remains borderline elevated-stable.  AST WNL. Avoid tylenol and alcohol use. Rest of CMP WNL.  CBC WNL. ESR WNL. UA normal.  Complements WNL.

## 2022-01-04 NOTE — Progress Notes (Signed)
ANA remains positive-low titer. Double-stranded DNA negative

## 2022-01-05 LAB — CBC WITH DIFFERENTIAL/PLATELET
Absolute Monocytes: 495 cells/uL (ref 200–950)
Basophils Absolute: 39 cells/uL (ref 0–200)
Basophils Relative: 0.8 %
Eosinophils Absolute: 201 cells/uL (ref 15–500)
Eosinophils Relative: 4.1 %
HCT: 40.5 % (ref 35.0–45.0)
Hemoglobin: 13.6 g/dL (ref 11.7–15.5)
Lymphs Abs: 1210 cells/uL (ref 850–3900)
MCH: 29.8 pg (ref 27.0–33.0)
MCHC: 33.6 g/dL (ref 32.0–36.0)
MCV: 88.8 fL (ref 80.0–100.0)
MPV: 10.2 fL (ref 7.5–12.5)
Monocytes Relative: 10.1 %
Neutro Abs: 2955 cells/uL (ref 1500–7800)
Neutrophils Relative %: 60.3 %
Platelets: 273 10*3/uL (ref 140–400)
RBC: 4.56 10*6/uL (ref 3.80–5.10)
RDW: 12.1 % (ref 11.0–15.0)
Total Lymphocyte: 24.7 %
WBC: 4.9 10*3/uL (ref 3.8–10.8)

## 2022-01-05 LAB — C3 AND C4
C3 Complement: 137 mg/dL (ref 83–193)
C4 Complement: 22 mg/dL (ref 15–57)

## 2022-01-05 LAB — COMPLETE METABOLIC PANEL WITH GFR
AG Ratio: 2.2 (calc) (ref 1.0–2.5)
ALT: 38 U/L — ABNORMAL HIGH (ref 6–29)
AST: 28 U/L (ref 10–35)
Albumin: 4.6 g/dL (ref 3.6–5.1)
Alkaline phosphatase (APISO): 51 U/L (ref 37–153)
BUN: 14 mg/dL (ref 7–25)
CO2: 31 mmol/L (ref 20–32)
Calcium: 10.1 mg/dL (ref 8.6–10.4)
Chloride: 106 mmol/L (ref 98–110)
Creat: 0.88 mg/dL (ref 0.50–1.05)
Globulin: 2.1 g/dL (calc) (ref 1.9–3.7)
Glucose, Bld: 73 mg/dL (ref 65–99)
Potassium: 4 mmol/L (ref 3.5–5.3)
Sodium: 146 mmol/L (ref 135–146)
Total Bilirubin: 0.4 mg/dL (ref 0.2–1.2)
Total Protein: 6.7 g/dL (ref 6.1–8.1)
eGFR: 73 mL/min/{1.73_m2} (ref >=60)

## 2022-01-05 LAB — BETA-2 GLYCOPROTEIN ANTIBODIES
Beta-2 Glyco 1 IgA: >65 U/mL — ABNORMAL HIGH (ref <20.0)
Beta-2 Glyco 1 IgM: 10.7 U/mL (ref <20.0)
Beta-2 Glyco I IgG: >112 U/mL — ABNORMAL HIGH (ref <20.0)

## 2022-01-05 LAB — ANTI-NUCLEAR AB-TITER (ANA TITER)
ANA TITER: 1:80 {titer} — ABNORMAL HIGH
ANA Titer 1: 1:80 {titer} — ABNORMAL HIGH

## 2022-01-05 LAB — RFLX HEXAGONAL PHASE CONFIRM: Hexagonal Phase Conf: POSITIVE — AB

## 2022-01-05 LAB — SEDIMENTATION RATE: Sed Rate: 6 mm/h (ref 0–30)

## 2022-01-05 LAB — CARDIOLIPIN ANTIBODIES, IGG, IGM, IGA
Anticardiolipin IgA: >65 APL-U/mL — ABNORMAL HIGH (ref <20.0)
Anticardiolipin IgG: 59 GPL-U/mL — ABNORMAL HIGH (ref <20.0)
Anticardiolipin IgM: 9.2 MPL-U/mL (ref <20.0)

## 2022-01-05 LAB — URINALYSIS, ROUTINE W REFLEX MICROSCOPIC
Bilirubin Urine: NEGATIVE
Glucose, UA: NEGATIVE
Hgb urine dipstick: NEGATIVE
Ketones, ur: NEGATIVE
Leukocytes,Ua: NEGATIVE
Nitrite: NEGATIVE
Protein, ur: NEGATIVE
Specific Gravity, Urine: 1.009 (ref 1.001–1.035)
pH: 7.5 (ref 5.0–8.0)

## 2022-01-05 LAB — THROMBIN CLOTTING TIME: Thrombin Clotting Time: 18 s (ref 13–19)

## 2022-01-05 LAB — LUPUS ANTICOAGULANT EVAL W/ REFLEX
PTT-LA Screen: 42 s — ABNORMAL HIGH (ref <=40)
dRVVT: 61 s — ABNORMAL HIGH (ref <=45)

## 2022-01-05 LAB — RFLX DRVVT CONFRIM: DRVVT CONFIRM: POSITIVE — AB

## 2022-01-05 LAB — ANA: Anti Nuclear Antibody (ANA): POSITIVE — AB

## 2022-01-05 LAB — RFX DRVVT 1:1 MIX: PT, Mixing Interp: POSITIVE — AB

## 2022-01-05 LAB — ANTI-DNA ANTIBODY, DOUBLE-STRANDED: ds DNA Ab: 1 IU/mL

## 2022-01-05 NOTE — Progress Notes (Signed)
Lupus anticoagulant was detected.

## 2022-01-07 NOTE — Progress Notes (Signed)
Anticardiolipin IgA and IgG remain positive.  Beta-2 glycoprotein IgA and IgG remain positive.   No change in therapy recommended currently.

## 2022-01-08 DIAGNOSIS — F411 Generalized anxiety disorder: Secondary | ICD-10-CM | POA: Diagnosis not present

## 2022-01-08 DIAGNOSIS — F339 Major depressive disorder, recurrent, unspecified: Secondary | ICD-10-CM | POA: Diagnosis not present

## 2022-01-08 DIAGNOSIS — F5101 Primary insomnia: Secondary | ICD-10-CM | POA: Diagnosis not present

## 2022-01-08 DIAGNOSIS — F061 Catatonic disorder due to known physiological condition: Secondary | ICD-10-CM | POA: Diagnosis not present

## 2022-01-23 ENCOUNTER — Other Ambulatory Visit: Payer: Self-pay | Admitting: Physician Assistant

## 2022-01-23 DIAGNOSIS — M359 Systemic involvement of connective tissue, unspecified: Secondary | ICD-10-CM

## 2022-01-25 ENCOUNTER — Other Ambulatory Visit: Payer: Self-pay | Admitting: Physician Assistant

## 2022-01-25 DIAGNOSIS — M359 Systemic involvement of connective tissue, unspecified: Secondary | ICD-10-CM

## 2022-02-16 DIAGNOSIS — F411 Generalized anxiety disorder: Secondary | ICD-10-CM | POA: Diagnosis not present

## 2022-02-16 DIAGNOSIS — F339 Major depressive disorder, recurrent, unspecified: Secondary | ICD-10-CM | POA: Diagnosis not present

## 2022-02-16 DIAGNOSIS — F061 Catatonic disorder due to known physiological condition: Secondary | ICD-10-CM | POA: Diagnosis not present

## 2022-02-16 DIAGNOSIS — F5101 Primary insomnia: Secondary | ICD-10-CM | POA: Diagnosis not present

## 2022-02-27 DIAGNOSIS — E785 Hyperlipidemia, unspecified: Secondary | ICD-10-CM | POA: Diagnosis not present

## 2022-02-27 DIAGNOSIS — R635 Abnormal weight gain: Secondary | ICD-10-CM | POA: Diagnosis not present

## 2022-02-27 DIAGNOSIS — K625 Hemorrhage of anus and rectum: Secondary | ICD-10-CM | POA: Diagnosis not present

## 2022-02-27 DIAGNOSIS — R194 Change in bowel habit: Secondary | ICD-10-CM | POA: Diagnosis not present

## 2022-02-27 DIAGNOSIS — F32A Depression, unspecified: Secondary | ICD-10-CM | POA: Diagnosis not present

## 2022-02-27 DIAGNOSIS — K5730 Diverticulosis of large intestine without perforation or abscess without bleeding: Secondary | ICD-10-CM | POA: Diagnosis not present

## 2022-03-23 DIAGNOSIS — F5101 Primary insomnia: Secondary | ICD-10-CM | POA: Diagnosis not present

## 2022-03-23 DIAGNOSIS — F411 Generalized anxiety disorder: Secondary | ICD-10-CM | POA: Diagnosis not present

## 2022-03-23 DIAGNOSIS — F061 Catatonic disorder due to known physiological condition: Secondary | ICD-10-CM | POA: Diagnosis not present

## 2022-03-23 DIAGNOSIS — F332 Major depressive disorder, recurrent severe without psychotic features: Secondary | ICD-10-CM | POA: Diagnosis not present

## 2022-03-28 DIAGNOSIS — G4733 Obstructive sleep apnea (adult) (pediatric): Secondary | ICD-10-CM | POA: Diagnosis not present

## 2022-04-17 ENCOUNTER — Encounter: Payer: Self-pay | Admitting: Adult Health

## 2022-04-18 NOTE — Telephone Encounter (Signed)
Sent a community message to [employer] asking them to tag [place] to the loaner machine.

## 2022-04-22 ENCOUNTER — Other Ambulatory Visit: Payer: Self-pay | Admitting: Physician Assistant

## 2022-04-22 DIAGNOSIS — M359 Systemic involvement of connective tissue, unspecified: Secondary | ICD-10-CM

## 2022-04-23 NOTE — Telephone Encounter (Signed)
Last Fill: 01/02/2022  Eye exam: 06/08/2021 WNL    Labs: 01/02/2022 CMP WNL. CBC WNL.  Next Visit: 06/06/2022  Last Visit: 01/02/2022  DX:Autoimmune disease   Current Dose per office note 01/02/2022: Plaquenil 200 mg 1 tablet by mouth daily.   Okay to refill Plaquenil?

## 2022-05-03 DIAGNOSIS — F411 Generalized anxiety disorder: Secondary | ICD-10-CM | POA: Diagnosis not present

## 2022-05-03 DIAGNOSIS — F339 Major depressive disorder, recurrent, unspecified: Secondary | ICD-10-CM | POA: Diagnosis not present

## 2022-05-03 DIAGNOSIS — F061 Catatonic disorder due to known physiological condition: Secondary | ICD-10-CM | POA: Diagnosis not present

## 2022-05-03 DIAGNOSIS — F5101 Primary insomnia: Secondary | ICD-10-CM | POA: Diagnosis not present

## 2022-05-08 DIAGNOSIS — Z1231 Encounter for screening mammogram for malignant neoplasm of breast: Secondary | ICD-10-CM | POA: Diagnosis not present

## 2022-05-23 DIAGNOSIS — K080 Exfoliation of teeth due to systemic causes: Secondary | ICD-10-CM | POA: Diagnosis not present

## 2022-05-23 NOTE — Progress Notes (Signed)
Office Visit Note  Patient: Brianna Ross             Date of Birth: Jul 12, 1955           MRN: [id number]             PCP: Lorenda Ishihara, MD Referring: Lorenda Ishihara,* Visit Date: 06/06/2022 Occupation: @GUAROCC @  Subjective:  Medication management  History of Present Illness: Brianna Ross is a 67 y.o. female with history of autoimmune disease, osteoarthritis and osteopenia.  She denies any history of oral ulcers, nasal ulcers, malar rash, sicca symptoms, Raynaud's phenomenon, photosensitivity, inflammatory arthritis or lymphadenopathy.  She has been taking hydroxychloroquine 200 mg p.o. daily along with enteric-coated aspirin 325 mg p.o. daily.  She denies any history of joint pain or joint swelling.  She denies some stiffness with discomfort in her hands or knee joints today.  She had no episodes of DVTs.  She states that she had good balance.  There is no history of shortness of breath or palpitations.  She denies any GI side effects.    Activities of Daily Living:  Patient reports morning stiffness for 0 minutes.   Patient Denies nocturnal pain.  Difficulty dressing/grooming: Denies Difficulty climbing stairs: Denies Difficulty getting out of chair: Denies Difficulty using hands for taps, buttons, cutlery, and/or writing: Denies  Review of Systems  Constitutional:  Negative for fatigue.  HENT:  Negative for mouth sores and mouth dryness.   Eyes:  Negative for dryness.  Respiratory:  Negative for shortness of breath.   Cardiovascular:  Negative for chest pain and palpitations.  Gastrointestinal:  Negative for blood in stool, constipation and diarrhea.  Endocrine: Negative for increased urination.  Genitourinary:  Negative for involuntary urination.  Musculoskeletal:  Negative for joint pain, gait problem, joint pain, joint swelling, myalgias, muscle weakness, morning stiffness, muscle tenderness and myalgias.  Skin:  Negative for color change, rash, hair loss and  sensitivity to sunlight.  Allergic/Immunologic: Negative for susceptible to infections.  Neurological:  Negative for dizziness and headaches.  Hematological:  Negative for swollen glands.  Psychiatric/Behavioral:  Negative for depressed mood and sleep disturbance. The patient is not nervous/anxious.     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 03/06/2013   Family history of heart disease 12/10/2012   Herpes zoster keratoconjunctivitis 11/12/2012   Positive ANA (antinuclear antibody) 03/06/2012   Elevated aldolase level 03/06/2012   Atypical nevi 11/14/2011   Menopause 11/13/2011   Rosacea 11/13/2011    Past Medical History:  Diagnosis Date   Abnormal stress electrocardiogram test    Allergy    ANA positive    Atypical nevi    Depression    Headache(784.0)    Hives 2011   x5   Hyperlipidemia    Menopause    OSA (obstructive sleep apnea)    Osteoarthritis    Right wrist fracture    Rosacea  Shingles 03/2012   Shortness of breath    Sleep apnea    Urticaria     Family History  Problem Relation Age of Onset   Glaucoma Mother    Hyperlipidemia Mother    Depression Mother    Osteoporosis Mother    Breast cancer Mother        20y   Dementia Mother    Cancer Mother    Heart disease Father    Hyperlipidemia Father     Hypertension Father    Heart defect Sister    Heart disease Sister 47       congenital heart defect,deceased   Hyperlipidemia Brother    Depression Brother    High Cholesterol Brother    Sleep apnea Brother    Cancer Paternal Grandmother    Stroke Paternal Grandfather    Heart disease Paternal Grandfather    Past Surgical History:  Procedure Laterality Date   ORIF WRIST FRACTURE     thymic cyst     removal   WISDOM TOOTH EXTRACTION     Social History   Social History Narrative   Patient works for [employer].(full-time_    Patient lives at home with her husband Loleta Books).   Patient does not have any children.   Patient is right-handed.   Patient drinks one cup of coffee everyday and two cups of tea daily.   Immunization History  Administered Date(s) Administered   Influenza Split 11/13/2011   Influenza,inj,Quad PF,6+ Mos 11/20/2016   Influenza-Unspecified 09/11/2013   Moderna Sars-Covid-2 Vaccination 02/25/2019, 03/25/2019, 11/24/2019   Pneumococcal Polysaccharide-23 11/12/2012     Objective: Vital Signs: BP 91/64 (BP Location: Left Arm, Patient Position: Sitting, Cuff Size: Normal)   Pulse (!) 103   Resp 16   Ht 5\' 5"  (1.651 m)   Wt 178 lb (80.7 kg)   BMI 29.62 kg/m    Physical Exam Vitals and nursing note reviewed.  Constitutional:      Appearance: She is well-developed.  HENT:     Head: Normocephalic and atraumatic.  Eyes:     Conjunctiva/sclera: Conjunctivae normal.  Cardiovascular:     Rate and Rhythm: Normal rate and regular rhythm.     Heart sounds: Normal heart sounds.  Pulmonary:     Effort: Pulmonary effort is normal.     Breath sounds: Normal breath sounds.  Abdominal:     General: Bowel sounds are normal.     Palpations: Abdomen is soft.  Musculoskeletal:     Cervical back: Normal range of motion.  Lymphadenopathy:     Cervical: No cervical adenopathy.  Skin:    General: Skin is warm and dry.     Capillary Refill:  Capillary refill takes less than 2 seconds.  Neurological:     Mental Status: She is alert and oriented to person, place, and time.  Psychiatric:        Behavior: Behavior normal.      Musculoskeletal Exam: She had limited lateral rotation of the cervical spine.  She could mobility in her lumbar spine.  Shoulders, elbows, wrist joints, MCPs PIPs and DIPs were in good range of motion.  She had mild DIP thickening.  Hip joints and knee joints in good range of motion without any warmth swelling or effusion.  There was no tenderness over ankles or MTPs.  CDAI Exam: CDAI Score: -- Patient Global: --; Provider Global: -- Swollen: --; Tender: -- Joint Exam 06/06/2022   No joint exam has been documented for this  visit   There is currently no information documented on the homunculus. Go to the Rheumatology activity and complete the homunculus joint exam.  Investigation: No additional findings.  Imaging: No results found.  Recent Labs: Lab Results  Component Value Date   WBC 4.9 01/02/2022   HGB 13.6 01/02/2022   PLT 273 01/02/2022   NA 146 01/02/2022   K 4.0 01/02/2022   CL 106 01/02/2022   CO2 31 01/02/2022   GLUCOSE 73 01/02/2022   BUN 14 01/02/2022   CREATININE 0.88 01/02/2022   BILITOT 0.4 01/02/2022   ALKPHOS 48 07/18/2020   AST 28 01/02/2022   ALT 38 (H) 01/02/2022   PROT 6.7 01/02/2022   ALBUMIN 4.2 07/18/2020   CALCIUM 10.1 01/02/2022   GFRAA 82 06/14/2020    Speciality Comments: PLQ Eye Exam: 06/08/2021 WNL @ [facility] Follow up in 1 year   Procedures:  No procedures performed Allergies: Patient has no known allergies.   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  She denies any history of shortness of breath, palpitations or GI side effects.  She has been tolerating hydroxychloroquine 200 mg p.o. daily without any side effects.  She also takes aspirin 325 mg p.o. daily.  Labs obtained on January 02, 2018 were reviewed.  ANA was 1: 80NS and 1: 80NH, dsDNA was negative.  Complements were normal and sed rate was normal.  Lupus anticoagulant was positive.  Anticardiolipin IgG and IgA were positive.  Beta-2 GP 1 IgM G was positive.  High risk medication use - Plaquenil 200 mg 1 tablet by mouth daily. PLQ Eye Exam: 06/08/2021 was normal.  On January 02, 2022 CBC and CMP were normal except ALT elevated at 38.  Lupus anticoagulant positive - lupus anticoagulant was detected and anticardiolipin IgG and IgA were positive, beta-2 GP 1 IgG positive.  Patient had increased risk of clotting due to triple antibody being positive.  She has been taking aspirin 325 mg p.o. daily.  She has been tolerating it without any side effects.  Side effects of aspirin use including increased risk of bleeding were discussed at length.  Patient does not have any increased risk of fall.  Osteopenia of multiple sites - DEXA updated on 08/10/2021: Right femoral neck BMD 0.601 with T-score -2.2.  DEXA scan findings were discussed with the patient.  Calcium rich diet with vitamin D was advised.  Need for regular exercise was discussed.  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  She denies any discomfort in her hands.  Joint protection was discussed.  Primary osteoarthritis of both knees-she denies any discomfort today.  Lower extremity muscle exercises were discussed.  History of hyperlipidemia-she is on Crestor 20 mg p.o. daily.  LFT elevation could be due to Crestor use.  Weight loss diet and exercise was discussed.  Increased association of heart disease with autoimmune disease was also discussed.  History of herpes zoster  keratoconjunctivitis  Severe major depression without psychotic features (HCC)  History of sleep apnea  Rosacea  Orders: No orders of the defined types were placed in this encounter.  No orders of the defined types were placed in this encounter.    Follow-Up Instructions: Return in about 5 months (around 11/06/2022) for Autoimmune  disease.   Pollyann Savoy, MD  Note - This record has been created using Animal nutritionist.  Chart creation errors have been sought, but may not always  have been located. Such creation errors do not reflect on  the standard of medical care.

## 2022-06-06 ENCOUNTER — Ambulatory Visit: Payer: Medicare Other | Attending: Rheumatology | Admitting: Rheumatology

## 2022-06-06 ENCOUNTER — Encounter: Payer: Self-pay | Admitting: Rheumatology

## 2022-06-06 VITALS — BP 91/64 | HR 103 | Resp 16 | Ht 65.0 in | Wt 178.0 lb

## 2022-06-06 DIAGNOSIS — M170 Bilateral primary osteoarthritis of knee: Secondary | ICD-10-CM

## 2022-06-06 DIAGNOSIS — Z8639 Personal history of other endocrine, nutritional and metabolic disease: Secondary | ICD-10-CM

## 2022-06-06 DIAGNOSIS — M19042 Primary osteoarthritis, left hand: Secondary | ICD-10-CM

## 2022-06-06 DIAGNOSIS — F322 Major depressive disorder, single episode, severe without psychotic features: Secondary | ICD-10-CM

## 2022-06-06 DIAGNOSIS — R760 Raised antibody titer: Secondary | ICD-10-CM | POA: Diagnosis not present

## 2022-06-06 DIAGNOSIS — M3500 Sicca syndrome, unspecified: Secondary | ICD-10-CM

## 2022-06-06 DIAGNOSIS — M359 Systemic involvement of connective tissue, unspecified: Secondary | ICD-10-CM

## 2022-06-06 DIAGNOSIS — Z79899 Other long term (current) drug therapy: Secondary | ICD-10-CM | POA: Diagnosis not present

## 2022-06-06 DIAGNOSIS — Z8619 Personal history of other infectious and parasitic diseases: Secondary | ICD-10-CM

## 2022-06-06 DIAGNOSIS — M8589 Other specified disorders of bone density and structure, multiple sites: Secondary | ICD-10-CM

## 2022-06-06 DIAGNOSIS — M19041 Primary osteoarthritis, right hand: Secondary | ICD-10-CM

## 2022-06-06 DIAGNOSIS — Z8669 Personal history of other diseases of the nervous system and sense organs: Secondary | ICD-10-CM

## 2022-06-06 DIAGNOSIS — L719 Rosacea, unspecified: Secondary | ICD-10-CM

## 2022-06-06 NOTE — Patient Instructions (Signed)
Vaccines °You are taking a medication(s) that can suppress your immune system.  The following immunizations are recommended: °Flu annually °Covid-19  °Td/Tdap (tetanus, diphtheria, pertussis) every 10 years °Pneumonia (Prevnar 15 then Pneumovax 23 at least 1 year apart.  Alternatively, can take Prevnar 20 without needing additional dose) °Shingrix: 2 doses from 4 weeks to 6 months apart ° °Please check with your PCP to make sure you are up to date.  ° °Heart Disease Prevention  ° °Your inflammatory disease increases your risk of heart disease which includes heart attack, stroke, atrial fibrillation (irregular heartbeats), high blood pressure, heart failure and atherosclerosis (plaque in the arteries).  It is important to reduce your risk by:  ° °Keep blood pressure, cholesterol, and blood sugar at healthy levels  ° °Smoking Cessation  ° °Maintain a healthy weight  °BMI 20-25  ° °Eat a healthy diet  °Plenty of fresh fruit, vegetables, and whole grains  °Limit saturated fats, foods high in sodium, and added sugars  °DASH and Mediterranean diet  ° °Increase physical activity  °Recommend moderate physically activity for 150 minutes per week/ 30 minutes a day for five days a week These can be broken up into three separate ten-minute sessions during the day.  ° °Reduce Stress  °Meditation, slow breathing exercises, yoga, coloring books  °Dental visits twice a year   °

## 2022-06-07 ENCOUNTER — Encounter: Payer: Self-pay | Admitting: Rheumatology

## 2022-06-07 NOTE — Telephone Encounter (Signed)
I returned patient's call and advised her to stop aspirin 5 days prior to the colonoscopy  as recommended by her gastroenterologist and resume aspirin after the procedure.  I also discussed reducing the dose of aspirin from 325 mg to 162 mg a day.  The lower dosing is equally effective and reduces the risk of bleeding.  Patient voiced understanding.

## 2022-06-08 DIAGNOSIS — Z01419 Encounter for gynecological examination (general) (routine) without abnormal findings: Secondary | ICD-10-CM | POA: Diagnosis not present

## 2022-06-08 DIAGNOSIS — Z124 Encounter for screening for malignant neoplasm of cervix: Secondary | ICD-10-CM | POA: Diagnosis not present

## 2022-06-08 DIAGNOSIS — Z1151 Encounter for screening for human papillomavirus (HPV): Secondary | ICD-10-CM | POA: Diagnosis not present

## 2022-06-08 DIAGNOSIS — L900 Lichen sclerosus et atrophicus: Secondary | ICD-10-CM | POA: Diagnosis not present

## 2022-06-10 LAB — CBC WITH DIFFERENTIAL/PLATELET
Absolute Monocytes: 1054 cells/uL — ABNORMAL HIGH (ref 200–950)
Basophils Absolute: 60 cells/uL (ref 0–200)
Basophils Relative: 0.7 %
Eosinophils Absolute: 230 cells/uL (ref 15–500)
Eosinophils Relative: 2.7 %
HCT: 41.4 % (ref 35.0–45.0)
Hemoglobin: 13.8 g/dL (ref 11.7–15.5)
Lymphs Abs: 1581 cells/uL (ref 850–3900)
MCH: 29.4 pg (ref 27.0–33.0)
MCHC: 33.3 g/dL (ref 32.0–36.0)
MCV: 88.1 fL (ref 80.0–100.0)
MPV: 9.9 fL (ref 7.5–12.5)
Monocytes Relative: 12.4 %
Neutro Abs: 5576 cells/uL (ref 1500–7800)
Neutrophils Relative %: 65.6 %
Platelets: 282 10*3/uL (ref 140–400)
RBC: 4.7 10*6/uL (ref 3.80–5.10)
RDW: 12.6 % (ref 11.0–15.0)
Total Lymphocyte: 18.6 %
WBC: 8.5 10*3/uL (ref 3.8–10.8)

## 2022-06-10 LAB — COMPLETE METABOLIC PANEL WITH GFR
AG Ratio: 2 (calc) (ref 1.0–2.5)
ALT: 33 U/L — ABNORMAL HIGH (ref 6–29)
AST: 22 U/L (ref 10–35)
Albumin: 4.3 g/dL (ref 3.6–5.1)
Alkaline phosphatase (APISO): 56 U/L (ref 37–153)
BUN: 13 mg/dL (ref 7–25)
CO2: 26 mmol/L (ref 20–32)
Calcium: 9.9 mg/dL (ref 8.6–10.4)
Chloride: 105 mmol/L (ref 98–110)
Creat: 0.88 mg/dL (ref 0.50–1.05)
Globulin: 2.2 g/dL (calc) (ref 1.9–3.7)
Glucose, Bld: 86 mg/dL (ref 65–99)
Potassium: 3.8 mmol/L (ref 3.5–5.3)
Sodium: 142 mmol/L (ref 135–146)
Total Bilirubin: 0.5 mg/dL (ref 0.2–1.2)
Total Protein: 6.5 g/dL (ref 6.1–8.1)
eGFR: 73 mL/min/{1.73_m2} (ref >=60)

## 2022-06-10 LAB — C3 AND C4
C3 Complement: 140 mg/dL (ref 83–193)
C4 Complement: 22 mg/dL (ref 15–57)

## 2022-06-10 LAB — PROTEIN / CREATININE RATIO, URINE
Creatinine, Urine: 128 mg/dL (ref 20–275)
Protein/Creat Ratio: 94 mg/g creat (ref 24–184)
Protein/Creatinine Ratio: 0.094 mg/mg creat (ref 0.024–0.184)
Total Protein, Urine: 12 mg/dL (ref 5–24)

## 2022-06-10 LAB — ANTI-DNA ANTIBODY, DOUBLE-STRANDED: ds DNA Ab: 1 IU/mL

## 2022-06-10 LAB — BETA-2 GLYCOPROTEIN ANTIBODIES
Beta-2 Glyco 1 IgA: >65 U/mL — ABNORMAL HIGH (ref <20.0)
Beta-2 Glyco 1 IgM: 7.6 U/mL (ref <20.0)
Beta-2 Glyco I IgG: 98.7 U/mL — ABNORMAL HIGH (ref <20.0)

## 2022-06-10 LAB — THROMBIN CLOTTING TIME: Thrombin Clotting Time: 16 s (ref 13–19)

## 2022-06-10 LAB — LUPUS ANTICOAGULANT EVAL W/ REFLEX
PTT-LA Screen: 43 s — ABNORMAL HIGH (ref <=40)
dRVVT: 44 s (ref <=45)

## 2022-06-10 LAB — SEDIMENTATION RATE: Sed Rate: 6 mm/h (ref 0–30)

## 2022-06-10 LAB — RFLX HEXAGONAL PHASE CONFIRM: Hexagonal Phase Conf: POSITIVE — AB

## 2022-06-10 LAB — CARDIOLIPIN ANTIBODIES, IGG, IGM, IGA
Anticardiolipin IgA: >65 APL-U/mL — ABNORMAL HIGH (ref <20.0)
Anticardiolipin IgG: 55.8 GPL-U/mL — ABNORMAL HIGH (ref <20.0)
Anticardiolipin IgM: 6 MPL-U/mL (ref <20.0)

## 2022-06-10 NOTE — Progress Notes (Signed)
CBC and CMP are normal except liver function is mildly elevated most likely due to statin use.  Beta-2 GP 1 antibody, anticardiolipin antibody and lupus anticoagulant antibodies are positive.  Patient was advised to continue aspirin during the last office visit.  Double-stranded DNA negative, urine protein creatinine ratio normal, complements normal, sed rate normal.  Labs do not indicate an autoimmune disease flare.

## 2022-06-12 DIAGNOSIS — F061 Catatonic disorder due to known physiological condition: Secondary | ICD-10-CM | POA: Diagnosis not present

## 2022-06-12 DIAGNOSIS — F5101 Primary insomnia: Secondary | ICD-10-CM | POA: Diagnosis not present

## 2022-06-12 DIAGNOSIS — F411 Generalized anxiety disorder: Secondary | ICD-10-CM | POA: Diagnosis not present

## 2022-06-12 DIAGNOSIS — F339 Major depressive disorder, recurrent, unspecified: Secondary | ICD-10-CM | POA: Diagnosis not present

## 2022-06-20 DIAGNOSIS — Z79899 Other long term (current) drug therapy: Secondary | ICD-10-CM | POA: Diagnosis not present

## 2022-06-25 DIAGNOSIS — L900 Lichen sclerosus et atrophicus: Secondary | ICD-10-CM | POA: Diagnosis not present

## 2022-06-27 DIAGNOSIS — K625 Hemorrhage of anus and rectum: Secondary | ICD-10-CM | POA: Diagnosis not present

## 2022-06-28 DIAGNOSIS — D2271 Melanocytic nevi of right lower limb, including hip: Secondary | ICD-10-CM | POA: Diagnosis not present

## 2022-06-28 DIAGNOSIS — L738 Other specified follicular disorders: Secondary | ICD-10-CM | POA: Diagnosis not present

## 2022-06-28 DIAGNOSIS — L578 Other skin changes due to chronic exposure to nonionizing radiation: Secondary | ICD-10-CM | POA: Diagnosis not present

## 2022-06-28 DIAGNOSIS — Z86018 Personal history of other benign neoplasm: Secondary | ICD-10-CM | POA: Diagnosis not present

## 2022-06-28 DIAGNOSIS — L570 Actinic keratosis: Secondary | ICD-10-CM | POA: Diagnosis not present

## 2022-07-12 DIAGNOSIS — E559 Vitamin D deficiency, unspecified: Secondary | ICD-10-CM | POA: Diagnosis not present

## 2022-07-12 DIAGNOSIS — Z0000 Encounter for general adult medical examination without abnormal findings: Secondary | ICD-10-CM | POA: Diagnosis not present

## 2022-07-12 DIAGNOSIS — R768 Other specified abnormal immunological findings in serum: Secondary | ICD-10-CM | POA: Diagnosis not present

## 2022-07-12 DIAGNOSIS — M359 Systemic involvement of connective tissue, unspecified: Secondary | ICD-10-CM | POA: Diagnosis not present

## 2022-07-12 DIAGNOSIS — Z1331 Encounter for screening for depression: Secondary | ICD-10-CM | POA: Diagnosis not present

## 2022-07-12 DIAGNOSIS — R748 Abnormal levels of other serum enzymes: Secondary | ICD-10-CM | POA: Diagnosis not present

## 2022-07-12 DIAGNOSIS — E785 Hyperlipidemia, unspecified: Secondary | ICD-10-CM | POA: Diagnosis not present

## 2022-07-12 DIAGNOSIS — G4733 Obstructive sleep apnea (adult) (pediatric): Secondary | ICD-10-CM | POA: Diagnosis not present

## 2022-07-12 DIAGNOSIS — D6869 Other thrombophilia: Secondary | ICD-10-CM | POA: Diagnosis not present

## 2022-07-12 DIAGNOSIS — F3341 Major depressive disorder, recurrent, in partial remission: Secondary | ICD-10-CM | POA: Diagnosis not present

## 2022-07-20 ENCOUNTER — Other Ambulatory Visit: Payer: Self-pay | Admitting: Physician Assistant

## 2022-07-20 DIAGNOSIS — M359 Systemic involvement of connective tissue, unspecified: Secondary | ICD-10-CM

## 2022-07-20 NOTE — Telephone Encounter (Signed)
Last Fill: 04/23/2022  Eye exam: 06/20/2022 WNL    Labs: 06/06/2022 CBC and CMP are normal except liver function is mildly elevated most likely due to statin use   Next Visit: 11/06/2022  Last Visit: 06/06/2022  DX:Autoimmune disease   Current Dose per office note 06/06/2022: - Plaquenil 200 mg 1 tablet by mouth daily.   Okay to refill Plaquenil?

## 2022-07-23 DIAGNOSIS — F5101 Primary insomnia: Secondary | ICD-10-CM | POA: Diagnosis not present

## 2022-07-23 DIAGNOSIS — F411 Generalized anxiety disorder: Secondary | ICD-10-CM | POA: Diagnosis not present

## 2022-07-23 DIAGNOSIS — F061 Catatonic disorder due to known physiological condition: Secondary | ICD-10-CM | POA: Diagnosis not present

## 2022-07-23 DIAGNOSIS — F339 Major depressive disorder, recurrent, unspecified: Secondary | ICD-10-CM | POA: Diagnosis not present

## 2022-08-01 DIAGNOSIS — Z1211 Encounter for screening for malignant neoplasm of colon: Secondary | ICD-10-CM | POA: Diagnosis not present

## 2022-08-01 DIAGNOSIS — K625 Hemorrhage of anus and rectum: Secondary | ICD-10-CM | POA: Diagnosis not present

## 2022-08-01 DIAGNOSIS — K5730 Diverticulosis of large intestine without perforation or abscess without bleeding: Secondary | ICD-10-CM | POA: Diagnosis not present

## 2022-09-05 DIAGNOSIS — F411 Generalized anxiety disorder: Secondary | ICD-10-CM | POA: Diagnosis not present

## 2022-09-05 DIAGNOSIS — F5101 Primary insomnia: Secondary | ICD-10-CM | POA: Diagnosis not present

## 2022-09-05 DIAGNOSIS — F061 Catatonic disorder due to known physiological condition: Secondary | ICD-10-CM | POA: Diagnosis not present

## 2022-09-05 DIAGNOSIS — F332 Major depressive disorder, recurrent severe without psychotic features: Secondary | ICD-10-CM | POA: Diagnosis not present

## 2022-09-27 DIAGNOSIS — G4731 Primary central sleep apnea: Secondary | ICD-10-CM | POA: Diagnosis not present

## 2022-09-27 DIAGNOSIS — G4733 Obstructive sleep apnea (adult) (pediatric): Secondary | ICD-10-CM | POA: Diagnosis not present

## 2022-10-16 ENCOUNTER — Other Ambulatory Visit: Payer: Self-pay | Admitting: *Deleted

## 2022-10-16 DIAGNOSIS — M359 Systemic involvement of connective tissue, unspecified: Secondary | ICD-10-CM

## 2022-10-16 MED ORDER — HYDROXYCHLOROQUINE SULFATE 200 MG PO TABS
ORAL_TABLET | ORAL | 0 refills | Status: DC
Start: 2022-10-16 — End: 2023-01-22

## 2022-10-16 NOTE — Telephone Encounter (Signed)
Refill request received via fax from [facility]- [address]  for Plaquenil    Last Fill: 07/20/2022   Eye exam: 06/20/2022 WNL     Labs: 06/06/2022 CBC and CMP are normal except liver function is mildly elevated most likely due to statin use    Next Visit: 11/06/2022   Last Visit: 06/06/2022   DX:Autoimmune disease    Current Dose per office note 06/06/2022: - Plaquenil 200 mg 1 tablet by mouth daily.    Okay to refill Plaquenil?

## 2022-10-23 DIAGNOSIS — F332 Major depressive disorder, recurrent severe without psychotic features: Secondary | ICD-10-CM | POA: Diagnosis not present

## 2022-10-23 DIAGNOSIS — F061 Catatonic disorder due to known physiological condition: Secondary | ICD-10-CM | POA: Diagnosis not present

## 2022-10-23 DIAGNOSIS — F411 Generalized anxiety disorder: Secondary | ICD-10-CM | POA: Diagnosis not present

## 2022-10-23 DIAGNOSIS — F5101 Primary insomnia: Secondary | ICD-10-CM | POA: Diagnosis not present

## 2022-10-24 NOTE — Progress Notes (Unsigned)
Office Visit Note  Patient: Brianna Ross             Date of Birth: January 17, 1955           MRN: [id number]             PCP: Brianna Ishihara, MD Referring: Brianna Ross,* Visit Date: 11/06/2022 Occupation: @GUAROCC @  Subjective:  Medication monitoring   History of Present Illness: Brianna Ross is a 67 y.o. female with history of autoimmune disease and osteoarthritis.  Patient remains on Plaquenil 200 mg 1 tablet by mouth daily.  She continues to tolerate Plaquenil without any side effects.  She has not had any gaps in therapy.  She denies any signs or symptoms of a flare.  Her energy level has been stable.  She has been walking for exercise.  Patient has occasional discomfort in her knee joints at night but has been sleeping with a pillow between her knees which has been helpful.  She denies any joint swelling.  Patient continues to take aspirin 162 mg daily.  No recent blood clots.  She denies any oral or nasal ulcerations.  She has not had any sicca symptoms.  She denies any recent rashes and has been trying to avoid direct sun exposure.  She wears sunscreen on a daily basis.  She denies any symptoms of Raynaud's phenomenon.    Activities of Daily Living:  Patient reports morning stiffness for 0 minutes  Patient Denies nocturnal pain.  Difficulty dressing/grooming: Denies Difficulty climbing stairs: Denies Difficulty getting out of chair: Denies Difficulty using hands for taps, buttons, cutlery, and/or writing: Denies  Review of Systems  Constitutional:  Negative for fatigue.  HENT:  Negative for mouth sores, mouth dryness and nose dryness.   Eyes:  Negative for pain, visual disturbance and dryness.  Respiratory:  Negative for cough, hemoptysis, shortness of breath and difficulty breathing.   Cardiovascular:  Negative for chest pain, palpitations, hypertension and swelling in legs/feet.  Gastrointestinal:  Negative for blood in stool, constipation and diarrhea.   Endocrine: Negative for increased urination.  Genitourinary:  Negative for painful urination.  Musculoskeletal:  Positive for joint pain and joint pain. Negative for joint swelling, myalgias, muscle weakness, morning stiffness, muscle tenderness and myalgias.  Skin:  Negative for color change, pallor, rash, hair loss, nodules/bumps, skin tightness, ulcers and sensitivity to sunlight.  Allergic/Immunologic: Negative for susceptible to infections.  Neurological:  Negative for dizziness, numbness, headaches and weakness.  Hematological:  Negative for swollen glands.  Psychiatric/Behavioral:  Negative for depressed mood and sleep disturbance. The patient is not nervous/anxious.     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   Positive ANA (antinuclear antibody) 03/06/2012   Elevated aldolase level 03/06/2012   Atypical nevi 11/14/2011   Menopause 11/13/2011   Rosacea 11/13/2011  Past Medical History:  Diagnosis Date   Abnormal stress electrocardiogram test    Allergy    ANA positive    Atypical nevi    Depression    Headache(784.0)    Hives 2011   x5   Hyperlipidemia    Lichen sclerosus 2024   dx by GYN per patient   Menopause    OSA  (obstructive sleep apnea)    Osteoarthritis    Right wrist fracture    Rosacea    Shingles 03/2012   Shortness of breath    Sleep apnea    Urticaria     Family History  Problem Relation Age of Onset   Glaucoma Mother    Hyperlipidemia Mother    Depression Mother    Osteoporosis Mother    Breast cancer Mother        76y   Dementia Mother    Cancer Mother    Heart disease Father    Hyperlipidemia Father    Hypertension Father    Heart defect Sister    Heart disease Sister 67       congenital heart defect,deceased   Hyperlipidemia Brother    Depression Brother    High Cholesterol Brother    Sleep apnea Brother    Cancer Paternal Grandmother    Stroke Paternal Grandfather    Heart disease Paternal Grandfather    Past Surgical History:  Procedure Laterality Date   ORIF WRIST FRACTURE     thymic cyst     removal   WISDOM TOOTH EXTRACTION     Social History   Social History Narrative   Patient works for [employer].(full-time_    Patient lives at home with her husband Brianna Ross).   Patient does not have any children.   Patient is right-handed.   Patient drinks one cup of coffee everyday and two cups of tea daily.   Immunization History  Administered Date(s) Administered   Influenza Split 11/13/2011   Influenza,inj,Quad PF,6+ Mos 11/20/2016   Influenza-Unspecified 09/11/2013   Moderna Sars-Covid-2 Vaccination 02/25/2019, 03/25/2019, 11/24/2019   Pneumococcal Polysaccharide-23 11/12/2012     Objective: Vital Signs: BP 122/83 (BP Location: Left Arm, Patient Position: Sitting, Cuff Size: Normal)   Pulse 96   Resp 16   Ht 5' 4.5" (1.638 m)   Wt 180 lb 3.2 oz (81.7 kg)   BMI 30.45 kg/m    Physical Exam Vitals and nursing note reviewed.  Constitutional:      Appearance: She is well-developed.  HENT:     Head: Normocephalic and atraumatic.     Mouth/Throat:     Comments: No parotid swelling or tenderness  Eyes:     Conjunctiva/sclera:  Conjunctivae normal.  Cardiovascular:     Rate and Rhythm: Normal rate and regular rhythm.     Heart sounds: Normal heart sounds.  Pulmonary:     Effort: Pulmonary effort is normal.     Breath sounds: Normal breath sounds.  Abdominal:     General: Bowel sounds are normal.     Palpations: Abdomen is soft.  Musculoskeletal:     Cervical back: Normal range of motion.  Lymphadenopathy:     Cervical: No cervical adenopathy.  Skin:    General: Skin is warm and dry.     Capillary Refill: Capillary refill takes less than 2 seconds.     Comments: No malar rash noted   Neurological:     Mental Status: She is alert and oriented to person, place, and time.  Psychiatric:        Behavior: Behavior normal.      Musculoskeletal Exam: C-spine has limited range of motion with lateral rotation.  No midline spinal tenderness.  Shoulder joints, elbow joints, wrist joints, MCPs, PIPs, DIPs have good range of motion with no synovitis.  Complete fist formation bilaterally.  Hip joints have good range of motion with no groin pain.  Knee joints have good range of motion no warmth or effusion.  Ankle joints have good range of motion with no tenderness or joint swelling.  CDAI Exam: CDAI Score: -- Patient Global: --; Provider Global: -- Swollen: --; Tender: -- Joint Exam 11/06/2022   No joint exam has been documented for this visit   There is currently no information documented on the homunculus. Go to the Rheumatology activity and complete the homunculus joint exam.  Investigation: No additional findings.  Imaging: No results found.  Recent Labs: Lab Results  Component Value Date   WBC 8.5 06/06/2022   HGB 13.8 06/06/2022   PLT 282 06/06/2022   NA 142 06/06/2022   K 3.8 06/06/2022   CL 105 06/06/2022   CO2 26 06/06/2022   GLUCOSE 86 06/06/2022   BUN 13 06/06/2022   CREATININE 0.88 06/06/2022   BILITOT 0.5 06/06/2022   ALKPHOS 48 07/18/2020   AST 22 06/06/2022   ALT 33 (H) 06/06/2022    PROT 6.5 06/06/2022   ALBUMIN 4.2 07/18/2020   CALCIUM 9.9 06/06/2022   GFRAA 82 06/14/2020    Speciality Comments: PLQ Eye Exam: 06/20/2022 WNL @ [facility] Follow up in 1 year  Procedures:  No procedures performed Allergies: Patient has no known allergies.     Assessment / Plan:     Visit Diagnoses: Autoimmune disease (HCC) - history of positive ANA, positive anticardiolipin antibody, positive beta-2 GP 1, lupus anticoagulant positive hypocomplementemia and sicca symptoms: Has not had any signs or symptoms of an autoimmune disease flare.  She has clinically been doing well taking Plaquenil 200 mg 1 tablet by mouth daily.  She is tolerating Plaquenil without any side effects and has not had any interruptions in therapy.  Her energy level has been stable and she has been walking for exercise.  She has no synovitis on examination today.  No malar rash noted.  No oral or nasal ulcerations.  She has not had any sicca symptoms.  No cervical lymphadenopathy noted she has been trying to avoid direct sun exposure and has been wearing sunscreen daily.  She has not had any symptoms of Raynaud's phenomenon.  She has not had any shortness of breath and her lungs were clear to auscultation today. Lab work from 06/06/22 was reviewed today in the office: Complements WNL, ESR WNL, dsDNA negative, LA+, anticardiolipin IgA and IgG positive, and beta-2 glycoprotein IgA and IgG positive.  She is taking aspirin 162 mg daily. The following lab work will be updated today.  She will remain on Plaquenil as prescribed.  She was advised to notify us if she develops any new or worsening symptoms.  She will follow-up in the office in 5 months or sooner if needed.  - Plan: COMPLETE METABOLIC PANEL WITH GFR, CBC with Differential/Platelet, Protein / creatinine ratio, urine, Anti-DNA antibody, double-stranded, C3 and C4, Sedimentation rate, Beta-2 glycoprotein antibodies, Cardiolipin antibodies, IgG, IgM, IgA, Lupus  Anticoagulant Eval w/Reflex  High risk medication use - Plaquenil 200 mg 1 tablet by mouth daily.  PLQ Eye Exam: 06/20/2022 WNL @ [facility] Follow up in 1  year  CBC and CMP updated on 06/06/22. Orders for CBC and CMP released today.   - Plan: COMPLETE METABOLIC PANEL WITH GFR, CBC with Differential/Platelet  Lupus anticoagulant positive - lupus anticoagulant was detected and anticardiolipin IgG and IgA were positive, beta-2 GP 1 IgG positive:  Patient is taking aspirin 162 mg daily.  Plan to obtain the following lab work today for further evaluation.- Plan: Beta-2 glycoprotein antibodies, Cardiolipin antibodies, IgG, IgM, IgA, Lupus Anticoagulant Eval w/Reflex  Osteopenia of multiple sites - DEXA updated on 08/10/2021: Right femoral neck BMD 0.601 with T-score -2.2. She is taking vitamin D 4000 units daily and calcium carbonate 600 mg twice daily. Update DEXA in August 2025.  Primary osteoarthritis of both hands: She has PIP and DIP thickening consistent with osteoarthritis of both hands.  No synovitis noted.  Complete fist formation bilaterally.  Primary osteoarthritis of both knees: She has good range of motion of both knee joints on examination today.  No warmth or effusion noted.  Patient experiences occasional discomfort in her knee joints in the evenings.  She has been able to walk without difficulty for exercise.  Other medical conditions are listed as follows:   History of hyperlipidemia - Crestor 20 mg daily  History of herpes zoster keratoconjunctivitis  Severe major depression without psychotic features (HCC)  History of sleep apnea  Rosacea  Orders: Orders Placed This Encounter  Procedures   COMPLETE METABOLIC PANEL WITH GFR   CBC with Differential/Platelet   Protein / creatinine ratio, urine   Anti-DNA antibody, double-stranded   C3 and C4   Sedimentation rate   Beta-2 glycoprotein antibodies   Cardiolipin antibodies, IgG, IgM, IgA   Lupus Anticoagulant  Eval w/Reflex    Follow-Up Instructions: Return in about 5 months (around 04/06/2023) for Autoimmune Disease.   Gearldine Bienenstock, PA-C  Note - This record has been created using Dragon software.  Chart creation errors have been sought, but may not always  have been located. Such creation errors do not reflect on  the standard of medical care.

## 2022-10-27 DIAGNOSIS — G4731 Primary central sleep apnea: Secondary | ICD-10-CM | POA: Diagnosis not present

## 2022-10-27 DIAGNOSIS — G4733 Obstructive sleep apnea (adult) (pediatric): Secondary | ICD-10-CM | POA: Diagnosis not present

## 2022-11-06 ENCOUNTER — Encounter: Payer: Self-pay | Admitting: Physician Assistant

## 2022-11-06 ENCOUNTER — Ambulatory Visit: Payer: Medicare Other | Attending: Physician Assistant | Admitting: Physician Assistant

## 2022-11-06 VITALS — BP 122/83 | HR 96 | Resp 16 | Ht 64.5 in | Wt 180.2 lb

## 2022-11-06 DIAGNOSIS — L719 Rosacea, unspecified: Secondary | ICD-10-CM

## 2022-11-06 DIAGNOSIS — M170 Bilateral primary osteoarthritis of knee: Secondary | ICD-10-CM

## 2022-11-06 DIAGNOSIS — M19041 Primary osteoarthritis, right hand: Secondary | ICD-10-CM

## 2022-11-06 DIAGNOSIS — R760 Raised antibody titer: Secondary | ICD-10-CM | POA: Diagnosis not present

## 2022-11-06 DIAGNOSIS — M359 Systemic involvement of connective tissue, unspecified: Secondary | ICD-10-CM

## 2022-11-06 DIAGNOSIS — M19042 Primary osteoarthritis, left hand: Secondary | ICD-10-CM

## 2022-11-06 DIAGNOSIS — F322 Major depressive disorder, single episode, severe without psychotic features: Secondary | ICD-10-CM

## 2022-11-06 DIAGNOSIS — Z8619 Personal history of other infectious and parasitic diseases: Secondary | ICD-10-CM

## 2022-11-06 DIAGNOSIS — Z79899 Other long term (current) drug therapy: Secondary | ICD-10-CM | POA: Diagnosis not present

## 2022-11-06 DIAGNOSIS — Z8669 Personal history of other diseases of the nervous system and sense organs: Secondary | ICD-10-CM

## 2022-11-06 DIAGNOSIS — M8589 Other specified disorders of bone density and structure, multiple sites: Secondary | ICD-10-CM | POA: Diagnosis not present

## 2022-11-06 DIAGNOSIS — Z8639 Personal history of other endocrine, nutritional and metabolic disease: Secondary | ICD-10-CM

## 2022-11-07 NOTE — Progress Notes (Signed)
Complements WNL

## 2022-11-07 NOTE — Progress Notes (Signed)
ALT remains borderline elevated-stable. Rest of CMP WNL.  CBC WNL ESR WNL No proteinuria.

## 2022-11-11 LAB — COMPLETE METABOLIC PANEL WITH GFR
AG Ratio: 1.9 (calc) (ref 1.0–2.5)
ALT: 37 U/L — ABNORMAL HIGH (ref 6–29)
AST: 23 U/L (ref 10–35)
Albumin: 4.5 g/dL (ref 3.6–5.1)
Alkaline phosphatase (APISO): 53 U/L (ref 37–153)
BUN: 14 mg/dL (ref 7–25)
CO2: 27 mmol/L (ref 20–32)
Calcium: 10.3 mg/dL (ref 8.6–10.4)
Chloride: 107 mmol/L (ref 98–110)
Creat: 0.8 mg/dL (ref 0.50–1.05)
Globulin: 2.4 g/dL (ref 1.9–3.7)
Glucose, Bld: 76 mg/dL (ref 65–99)
Potassium: 3.8 mmol/L (ref 3.5–5.3)
Sodium: 143 mmol/L (ref 135–146)
Total Bilirubin: 0.5 mg/dL (ref 0.2–1.2)
Total Protein: 6.9 g/dL (ref 6.1–8.1)
eGFR: 81 mL/min/{1.73_m2} (ref >=60)

## 2022-11-11 LAB — CBC WITH DIFFERENTIAL/PLATELET
Absolute Lymphocytes: 1348 {cells}/uL (ref 850–3900)
Absolute Monocytes: 653 {cells}/uL (ref 200–950)
Basophils Absolute: 67 {cells}/uL (ref 0–200)
Basophils Relative: 1.1 %
Eosinophils Absolute: 366 {cells}/uL (ref 15–500)
Eosinophils Relative: 6 %
HCT: 42.5 % (ref 35.0–45.0)
Hemoglobin: 14 g/dL (ref 11.7–15.5)
MCH: 29.5 pg (ref 27.0–33.0)
MCHC: 32.9 g/dL (ref 32.0–36.0)
MCV: 89.5 fL (ref 80.0–100.0)
MPV: 9.7 fL (ref 7.5–12.5)
Monocytes Relative: 10.7 %
Neutro Abs: 3666 {cells}/uL (ref 1500–7800)
Neutrophils Relative %: 60.1 %
Platelets: 296 10*3/uL (ref 140–400)
RBC: 4.75 10*6/uL (ref 3.80–5.10)
RDW: 12.2 % (ref 11.0–15.0)
Total Lymphocyte: 22.1 %
WBC: 6.1 10*3/uL (ref 3.8–10.8)

## 2022-11-11 LAB — C3 AND C4
C3 Complement: 145 mg/dL (ref 83–193)
C4 Complement: 21 mg/dL (ref 15–57)

## 2022-11-11 LAB — LUPUS ANTICOAGULANT EVAL W/ REFLEX
PTT-LA Screen: 38 s (ref <=40)
dRVVT: 42 s (ref <=45)

## 2022-11-11 LAB — CARDIOLIPIN ANTIBODIES, IGG, IGM, IGA
Anticardiolipin IgA: >65 [APL'U]/mL — ABNORMAL HIGH (ref <20.0)
Anticardiolipin IgG: 49.4 [GPL'U]/mL — ABNORMAL HIGH (ref <20.0)
Anticardiolipin IgM: 6.3 [MPL'U]/mL (ref <20.0)

## 2022-11-11 LAB — ANTI-DNA ANTIBODY, DOUBLE-STRANDED: ds DNA Ab: 1 [IU]/mL

## 2022-11-11 LAB — BETA-2 GLYCOPROTEIN ANTIBODIES
Beta-2 Glyco 1 IgA: >65 U/mL — ABNORMAL HIGH (ref <20.0)
Beta-2 Glyco 1 IgM: 6.3 U/mL (ref <20.0)
Beta-2 Glyco I IgG: 86.4 U/mL — ABNORMAL HIGH (ref <20.0)

## 2022-11-11 LAB — PROTEIN / CREATININE RATIO, URINE
Creatinine, Urine: 33 mg/dL (ref 20–275)
Total Protein, Urine: <4 mg/dL — ABNORMAL LOW (ref 5–24)

## 2022-11-11 LAB — SEDIMENTATION RATE: Sed Rate: 6 mm/h (ref 0–30)

## 2022-11-11 NOTE — Progress Notes (Signed)
Anticardiolipin antibodies IgG and IgA remain positive.    Beta-2 glycoproteins IgG and IgA remain positive.

## 2022-11-13 ENCOUNTER — Encounter: Payer: Self-pay | Admitting: Adult Health

## 2022-11-22 ENCOUNTER — Encounter: Payer: Self-pay | Admitting: Adult Health

## 2022-11-22 ENCOUNTER — Ambulatory Visit: Payer: Medicare Other | Admitting: Adult Health

## 2022-11-22 VITALS — BP 129/84 | HR 98 | Ht 64.5 in | Wt 182.0 lb

## 2022-11-22 DIAGNOSIS — G4733 Obstructive sleep apnea (adult) (pediatric): Secondary | ICD-10-CM | POA: Diagnosis not present

## 2022-11-26 DIAGNOSIS — B0239 Other herpes zoster eye disease: Secondary | ICD-10-CM | POA: Diagnosis not present

## 2022-11-26 DIAGNOSIS — H04123 Dry eye syndrome of bilateral lacrimal glands: Secondary | ICD-10-CM | POA: Diagnosis not present

## 2022-11-27 DIAGNOSIS — G4733 Obstructive sleep apnea (adult) (pediatric): Secondary | ICD-10-CM | POA: Diagnosis not present

## 2022-11-27 DIAGNOSIS — G4731 Primary central sleep apnea: Secondary | ICD-10-CM | POA: Diagnosis not present

## 2022-12-04 DIAGNOSIS — F339 Major depressive disorder, recurrent, unspecified: Secondary | ICD-10-CM | POA: Diagnosis not present

## 2022-12-04 DIAGNOSIS — F061 Catatonic disorder due to known physiological condition: Secondary | ICD-10-CM | POA: Diagnosis not present

## 2022-12-04 DIAGNOSIS — F411 Generalized anxiety disorder: Secondary | ICD-10-CM | POA: Diagnosis not present

## 2022-12-04 DIAGNOSIS — F5101 Primary insomnia: Secondary | ICD-10-CM | POA: Diagnosis not present

## 2022-12-10 ENCOUNTER — Encounter: Payer: Self-pay | Admitting: Adult Health

## 2022-12-10 DIAGNOSIS — G4733 Obstructive sleep apnea (adult) (pediatric): Secondary | ICD-10-CM

## 2022-12-10 DIAGNOSIS — H1789 Other corneal scars and opacities: Secondary | ICD-10-CM | POA: Diagnosis not present

## 2022-12-10 NOTE — Addendum Note (Signed)
Addended by: Bertram Savin on: 12/10/2022 09:08 AM   Modules accepted: Orders

## 2022-12-10 NOTE — Telephone Encounter (Signed)
New, Maryella Shivers, Otilio Jefferson, RN; Alain Honey; Angus Seller, Ellis; 1 other Hello [place],  Per notes on the account  We need the order updated to say Heated Humidity with all supplies listed separately or ALL SUPPLIES AS NEEDED listed on the order.     Thank you, Nida Boatman New   Order rewritten

## 2022-12-10 NOTE — Telephone Encounter (Signed)
Order sent/follow-up high priority to [employer].

## 2022-12-12 DIAGNOSIS — K080 Exfoliation of teeth due to systemic causes: Secondary | ICD-10-CM | POA: Diagnosis not present

## 2022-12-13 ENCOUNTER — Encounter: Payer: Self-pay | Admitting: Adult Health

## 2022-12-13 DIAGNOSIS — T148XXA Other injury of unspecified body region, initial encounter: Secondary | ICD-10-CM | POA: Diagnosis not present

## 2022-12-14 ENCOUNTER — Other Ambulatory Visit: Payer: Self-pay | Admitting: *Deleted

## 2022-12-14 DIAGNOSIS — G4733 Obstructive sleep apnea (adult) (pediatric): Secondary | ICD-10-CM

## 2022-12-17 DIAGNOSIS — H348322 Tributary (branch) retinal vein occlusion, left eye, stable: Secondary | ICD-10-CM | POA: Diagnosis not present

## 2022-12-17 DIAGNOSIS — H40012 Open angle with borderline findings, low risk, left eye: Secondary | ICD-10-CM | POA: Diagnosis not present

## 2022-12-17 DIAGNOSIS — H25042 Posterior subcapsular polar age-related cataract, left eye: Secondary | ICD-10-CM | POA: Diagnosis not present

## 2022-12-17 DIAGNOSIS — H2513 Age-related nuclear cataract, bilateral: Secondary | ICD-10-CM | POA: Diagnosis not present

## 2022-12-17 NOTE — Telephone Encounter (Signed)
RE: cpap  wants [place] location Received: 3 days ago New, Doristine Mango, RN; Kathyrn Sheriff Received, thank you!     Previous Messages    ----- Message ----- From: Guy Begin, RN Sent: 12/14/2022  11:48 AM EST To: Elige Radon New Subject: FW: cpap  wants [place] location            Done.  Andrey Campanile RN ----- Message ----- From: Kathyrn Sheriff Sent: 12/14/2022   9:50 AM EST To: Kathyrn Sheriff; Santina Evans; Rojelio Brenner; * Subject: RE: cpap  wants [place] location            "Hello, We need the order updated to say Heated Humidity with all supplies listed separately or You can also put heated humidity and all supplies as needed. This is required by insurance on all new orders.  Thank you,  Brad New"   ----- Message ----- From: Kathyrn Sheriff Sent: 12/13/2022   2:48 PM EST To: Kathyrn Sheriff; Santina Evans; Rojelio Brenner; * Subject: RE: cpap  wants [place] location            Received, thank you!  ----- Message ----- From: Guy Begin, RN Sent: 12/11/2022   5:06 PM EST To: Alain Honey; Rojelio Brenner; * Subject: cpap  wants [facility] location                See latest order  Brianna Ross Female, 67 y.o., 10/23/1955 Pronouns: she/her/hers MRN: [id number] Phone: [phone]   Thank you.  Andrey Campanile

## 2023-01-04 ENCOUNTER — Encounter: Payer: Self-pay | Admitting: Adult Health

## 2023-01-04 DIAGNOSIS — G4731 Primary central sleep apnea: Secondary | ICD-10-CM | POA: Diagnosis not present

## 2023-01-04 DIAGNOSIS — G4733 Obstructive sleep apnea (adult) (pediatric): Secondary | ICD-10-CM | POA: Diagnosis not present

## 2023-01-07 NOTE — Telephone Encounter (Signed)
 Message sent to [employer] to follow-up

## 2023-01-08 DIAGNOSIS — E7800 Pure hypercholesterolemia, unspecified: Secondary | ICD-10-CM | POA: Diagnosis not present

## 2023-01-08 DIAGNOSIS — F3341 Major depressive disorder, recurrent, in partial remission: Secondary | ICD-10-CM | POA: Diagnosis not present

## 2023-01-08 DIAGNOSIS — R748 Abnormal levels of other serum enzymes: Secondary | ICD-10-CM | POA: Diagnosis not present

## 2023-01-08 DIAGNOSIS — Z79899 Other long term (current) drug therapy: Secondary | ICD-10-CM | POA: Diagnosis not present

## 2023-01-15 DIAGNOSIS — F061 Catatonic disorder due to known physiological condition: Secondary | ICD-10-CM | POA: Diagnosis not present

## 2023-01-15 DIAGNOSIS — F332 Major depressive disorder, recurrent severe without psychotic features: Secondary | ICD-10-CM | POA: Diagnosis not present

## 2023-01-15 DIAGNOSIS — F411 Generalized anxiety disorder: Secondary | ICD-10-CM | POA: Diagnosis not present

## 2023-01-15 DIAGNOSIS — F5101 Primary insomnia: Secondary | ICD-10-CM | POA: Diagnosis not present

## 2023-01-21 ENCOUNTER — Other Ambulatory Visit: Payer: Self-pay | Admitting: Physician Assistant

## 2023-01-21 DIAGNOSIS — M359 Systemic involvement of connective tissue, unspecified: Secondary | ICD-10-CM

## 2023-01-22 NOTE — Telephone Encounter (Signed)
Last Fill: 10/16/2022  Eye exam: 06/20/2022 WNL   Labs: 11/06/2022 ALT remains borderline elevated-stable. Rest of CMP WNL. CBC WNL  Next Visit: 04/11/2023  Last Visit: 11/06/2022  DX:Autoimmune disease   Current Dose per office note 11/06/2022: Plaquenil 200 mg 1 tablet by mouth daily.   Okay to refill Plaquenil?

## 2023-01-29 DIAGNOSIS — G4731 Primary central sleep apnea: Secondary | ICD-10-CM | POA: Diagnosis not present

## 2023-01-29 DIAGNOSIS — G4733 Obstructive sleep apnea (adult) (pediatric): Secondary | ICD-10-CM | POA: Diagnosis not present

## 2023-02-05 ENCOUNTER — Encounter: Payer: Self-pay | Admitting: Adult Health

## 2023-02-05 NOTE — Telephone Encounter (Signed)
Bipap setup 01/29/23 (appt needed 03/29/23 - 04/29/23)

## 2023-02-24 ENCOUNTER — Encounter: Payer: Self-pay | Admitting: Adult Health

## 2023-02-25 NOTE — Telephone Encounter (Signed)
 Start date 01/29/23 (appt needed 03/29/23 - 04/29/23).

## 2023-02-26 DIAGNOSIS — F339 Major depressive disorder, recurrent, unspecified: Secondary | ICD-10-CM | POA: Diagnosis not present

## 2023-02-26 DIAGNOSIS — F061 Catatonic disorder due to known physiological condition: Secondary | ICD-10-CM | POA: Diagnosis not present

## 2023-02-26 DIAGNOSIS — F411 Generalized anxiety disorder: Secondary | ICD-10-CM | POA: Diagnosis not present

## 2023-02-26 DIAGNOSIS — F5101 Primary insomnia: Secondary | ICD-10-CM | POA: Diagnosis not present

## 2023-03-01 DIAGNOSIS — G4731 Primary central sleep apnea: Secondary | ICD-10-CM | POA: Diagnosis not present

## 2023-03-01 DIAGNOSIS — G4733 Obstructive sleep apnea (adult) (pediatric): Secondary | ICD-10-CM | POA: Diagnosis not present

## 2023-03-05 DIAGNOSIS — G4733 Obstructive sleep apnea (adult) (pediatric): Secondary | ICD-10-CM | POA: Diagnosis not present

## 2023-03-05 DIAGNOSIS — G4731 Primary central sleep apnea: Secondary | ICD-10-CM | POA: Diagnosis not present

## 2023-03-29 DIAGNOSIS — G4731 Primary central sleep apnea: Secondary | ICD-10-CM | POA: Diagnosis not present

## 2023-03-29 NOTE — Progress Notes (Deleted)
 Marland Kitchen

## 2023-03-29 NOTE — Progress Notes (Signed)
 Office Visit Note  Patient: Brianna Ross             Date of Birth: 1955-08-22           MRN: [id number]             PCP: Lorenda Ishihara, MD Referring: Lorenda Ishihara,* Visit Date: 04/11/2023 Occupation: @GUAROCC @  Subjective:  Patient management  History of Present Illness: Brianna Ross is a 68 y.o. female with autoimmune disease and osteoarthritis.  She returns today after her last visit in November 2024.  She denies any joint pain or joint swelling.  There is no history of oral ulcers, nasal ulcers, malar rash, photosensitivity, Raynaud's or lymphadenopathy.  She denies any history of blood clots.  And aspirin 81 mg 2 tablets daily.  A complete referral was if the patient interruption is just totally dropped and mild is like: I will call you yeah yeah    Activities of Daily Living:  Patient reports morning stiffness for 0 minute.   Patient Denies nocturnal pain.  Difficulty dressing/grooming: Denies Difficulty climbing stairs: Denies Difficulty getting out of chair: Denies Difficulty using hands for taps, buttons, cutlery, and/or writing: Denies  Review of Systems  Constitutional:  Negative for fatigue.  HENT:  Negative for mouth sores and mouth dryness.   Eyes:  Positive for dryness.  Respiratory:  Negative for shortness of breath.   Cardiovascular:  Negative for chest pain and palpitations.  Gastrointestinal:  Negative for blood in stool, constipation and diarrhea.  Endocrine: Negative for increased urination.  Genitourinary:  Negative for involuntary urination.  Musculoskeletal:  Negative for joint pain, gait problem, joint pain, joint swelling, myalgias, muscle weakness, morning stiffness, muscle tenderness and myalgias.  Skin:  Negative for color change, rash, hair loss and sensitivity to sunlight.  Allergic/Immunologic: Negative for susceptible to infections.  Neurological:  Negative for dizziness and headaches.  Hematological:  Negative for swollen glands.   Psychiatric/Behavioral:  Positive for depressed mood. Negative for sleep disturbance. The patient is not nervous/anxious.     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   Positive ANA (antinuclear antibody) 03/06/2012   Elevated aldolase level 03/06/2012   Atypical nevi 11/14/2011   Menopause 11/13/2011   Rosacea 11/13/2011    Past Medical History:  Diagnosis Date   Abnormal stress electrocardiogram test    Allergy    ANA positive    Atypical nevi    Depression    Headache(784.0)    Hives 2011   x5   Hyperlipidemia    Lichen sclerosus 2024   dx by GYN per patient   Menopause    OSA (obstructive sleep apnea)    Osteoarthritis    Right wrist fracture    Rosacea    Shingles 03/2012   Shortness of breath  Sleep apnea    Urticaria     Family History  Problem Relation Age of Onset   Glaucoma Mother    Hyperlipidemia Mother    Depression Mother    Osteoporosis Mother    Breast cancer Mother        69y   Dementia Mother    Cancer Mother    Heart disease Father    Hyperlipidemia Father    Hypertension Father    Heart defect Sister    Heart disease Sister 26       congenital heart defect,deceased   Hyperlipidemia Brother     Depression Brother    High Cholesterol Brother    Sleep apnea Brother    Cancer Paternal Grandmother    Stroke Paternal Grandfather    Heart disease Paternal Grandfather    Past Surgical History:  Procedure Laterality Date   ORIF WRIST FRACTURE     thymic cyst     removal   WISDOM TOOTH EXTRACTION     Social History   Social History Narrative   Patient works for [employer].(full-time_    Patient lives at home with her husband Loleta Books).   Patient does not have any children.   Patient is right-handed.   Patient drinks one cup of coffee everyday and two cups of tea daily.   Immunization History  Administered Date(s) Administered   Influenza Split 11/13/2011   Influenza,inj,Quad PF,6+ Mos 11/20/2016   Influenza-Unspecified 09/11/2013   Moderna Sars-Covid-2 Vaccination 02/25/2019, 03/25/2019, 11/24/2019   Pneumococcal Polysaccharide-23 11/12/2012     Objective: Vital Signs: BP 119/78 (BP Location: Left Arm, Patient Position: Sitting, Cuff Size: Normal)   Pulse (!) 102   Resp 14   Ht 5' 4.5" (1.638 m)   Wt 185 lb (83.9 kg)   BMI 31.26 kg/m    Physical Exam Vitals and nursing note reviewed.  Constitutional:      Appearance: She is well-developed.  HENT:     Head: Normocephalic and atraumatic.  Eyes:     Conjunctiva/sclera: Conjunctivae normal.  Cardiovascular:     Rate and Rhythm: Normal rate and regular rhythm.     Heart sounds: Normal heart sounds.  Pulmonary:     Effort: Pulmonary effort is normal.     Breath sounds: Normal breath sounds.  Abdominal:     General: Bowel sounds are normal.     Palpations: Abdomen is soft.  Musculoskeletal:     Cervical back: Normal range of motion.  Lymphadenopathy:     Cervical: No cervical adenopathy.  Skin:    General: Skin is warm and dry.     Capillary Refill: Capillary refill takes less than 2 seconds.  Neurological:     Mental Status: She is alert and oriented to person, place, and time.   Psychiatric:        Behavior: Behavior normal.      Musculoskeletal Exam: Patient had limited lateral rotation of the cervical spine.  Shoulders, elbow joints, wrist joints, MCPs PIPs and DIPs with good range of motion.  She had bilateral PIP and DIP thickening.  Hip joints and knee joints in good range of motion.  There was no tenderness over ankles or MTPs.  CDAI Exam: CDAI Score: -- Patient Global: --; Provider Global: -- Swollen: --; Tender: -- Joint Exam 04/11/2023   No joint exam has been documented for this visit   There is currently no information documented on the homunculus. Go to the Rheumatology activity and complete the homunculus joint  exam.  Investigation: No additional findings.  Imaging: No results found.  Recent Labs: Lab Results  Component Value Date   WBC 6.1 11/06/2022   HGB 14.0 11/06/2022   PLT 296 11/06/2022   NA 143 11/06/2022   K 3.8 11/06/2022   CL 107 11/06/2022   CO2 27 11/06/2022   GLUCOSE 76 11/06/2022   BUN 14 11/06/2022   CREATININE 0.80 11/06/2022   BILITOT 0.5 11/06/2022   ALKPHOS 48 07/18/2020   AST 23 11/06/2022   ALT 37 (H) 11/06/2022   PROT 6.9 11/06/2022   ALBUMIN 4.2 07/18/2020   CALCIUM 10.3 11/06/2022   GFRAA 82 06/14/2020    Speciality Comments: PLQ Eye Exam: 06/20/2022 WNL @ [facility] Follow up in 1 year  Procedures:  No procedures performed Allergies: Patient has no known allergies.   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  Anticardiolipin and beta-2 GP 1 antibodies remain positive.  Lupus anticoagulant was negative.  Will recheck labs again.  LFTs were mildly elevated.  Patient states LFTs were normal at her PCPs office.  Plan: Protein / creatinine ratio, urine, Anti-DNA antibody,  double-stranded, C3 and C4, Sedimentation rate, Beta-2 glycoprotein antibodies, Cardiolipin antibodies, IgG, IgM, IgA, Lupus Anticoagulant Eval w/Reflex  High risk medication use - Plaquenil 200 mg 1 tablet by mouth daily. PLQ Eye Exam: 06/20/2022 - Plan: CBC with Differential/Platelet, Comprehensive metabolic panel with GFR today.  Lupus anticoagulant positive - lupus anticoagulant was detected and anticardiolipin IgG and IgA were positive, beta-2 GP 1 IgG positive: Patient is taking aspirin 162 mg daily.  Side effects of long-term aspirin use including risk of GI bleed, elevation of LFTs, were discussed.  Increased risk of internal bleeding was also discussed.  Benefit versus risk ratio of taking aspirin for increased risk of thrombosis was also discussed.  At this time patient wants to continue on aspirin.  Osteopenia of multiple sites - DEXA updated on 08/10/2021: Right femoral neck BMD 0.601 with T-score -2.2.  Use of calcium and vitamin D was discussed.  Primary osteoarthritis of both hands-check bilateral DIP thickening.  No synovitis was noted.  Primary osteoarthritis of both knees-she had no discomfort range of motion.  Her medical problems are listed as follows:  History of hyperlipidemia-she is on Crestor.  That may be contributing to elevated LFTs.  History of herpes zoster keratoconjunctivitis  History of sleep apnea  Severe major depression without psychotic features (HCC)-she takes Effexor XR.  Rosacea  Orders: Orders Placed This Encounter  Procedures   Protein / creatinine ratio, urine   CBC with Differential/Platelet   Comprehensive metabolic panel with GFR   Anti-DNA antibody, double-stranded   C3 and C4   Sedimentation rate   Beta-2 glycoprotein antibodies   Cardiolipin antibodies, IgG, IgM, IgA   Lupus Anticoagulant Eval w/Reflex   No orders of the defined types were placed in this encounter.    Follow-Up Instructions: Return in about 6 months (around  10/11/2023) for Autoimmune disease.   Pollyann Savoy, MD  Note - This record has been created using Animal nutritionist.  Chart creation errors have been sought, but may not always  have been located. Such creation errors do not reflect on  the standard of medical care.

## 2023-03-31 NOTE — Progress Notes (Deleted)
 PATIENT: Brianna Ross DOB: 06/28/1955  REASON FOR VISIT: follow up HISTORY FROM: patient PRIMARY NEUROLOGIST: Dr. Vickey Huger  No chief complaint on file.   HISTORY OF PRESENT ILLNESS: Today 03/31/23:  Brianna Ross is a 68 y.o. female with a history of ***. Returns today for follow-up.      11/22/22: Brianna Ross is a 68 y.o. female with a history of obstructive sleep apnea on CPAP. Returns today for follow-up.  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  She states that it is leaking a lot.  She was told by her insurance company that she would qualify for new machine this year however she states that she has been denied [place].  Her download is below    11/15/21: Brianna Ross is a 68 year old female with a history of obstructive sleep apnea on BiPAP.  She returns today for follow-up.  She reports that BiPAP is working well.  She states that her machine is over 74 years old and would like a new machine.  Looks like an order was placed in October however she states DME has not contacted her about this.  Download is below.   05/10/21: Brianna Ross is a 68 year old female with a history of obstructive sleep apnea on BiPAP.  She returns today for follow-up.  At the last visit she was sent for BiPAP titration and an order was sent to her DME company to adjust her pressure however it appears this was never done.  Patient's download is below    08/11/20: Brianna Ross is a 68 year old female with a history of obstructive sleep apnea on BiPAP.  Her download indicates that he uses machine 29 out of 30 days for compliance of 97%.  She uses machine greater than 4 hours 26 days for compliance of 87%.  On average she uses her machine 7 hours and 52 minutes.  Her residual AHI is 30.5 on 16/10 centimeters of water her leak in the 95th percentile is 84.4 L/min.  The patient states that she has a [employer] fullface DreamWear mask she does not like the air blowing in her nostrils as she finds it very  uncomfortable.    REVIEW OF SYSTEMS: Out of a complete 14 system review of symptoms, the patient complains only of the following symptoms, and all other reviewed systems are negative.   ESS 3  ALLERGIES: No Known Allergies  HOME MEDICATIONS: Outpatient Medications Prior to Visit  Medication Sig Dispense Refill   acetaminophen (TYLENOL) 500 MG tablet Take 500 mg by mouth 2 (two) times daily as needed (pain).     ASPIRIN 81 PO Take 162 mg by mouth daily.     aspirin EC 81 MG tablet Take 81 mg by mouth 2 (two) times daily. Swallow whole.     buPROPion (WELLBUTRIN XL) 150 MG 24 hr tablet Take 450 mg by mouth daily.     calcium carbonate (OS-CAL) 600 MG tablet Take 600 mg by mouth in the morning and at bedtime.     Cholecalciferol (VITAMIN D) 2000 units tablet Take 4,000 Units by mouth daily.      clobetasol ointment (TEMOVATE) 0.05 % APPLY A THIN LAYER TO THE AFFECTED AREA(S) BY TOPICAL ROUTE 2 TIMES PER DAY     fluorometholone (FML) 0.1 % ophthalmic suspension Place 1 drop into the left eye daily.  5   hydroxychloroquine (PLAQUENIL) 200 MG tablet TAKE 1 TABLET BY MOUTH DAILY 90 tablet 0   Multiple Vitamin (MULTIVITAMIN PO) Take by mouth daily.  NONFORMULARY OR COMPOUNDED ITEM daily. Rosacea cream (azelaic acid 15%, metronidazole 1%, ivermectin 1%)     OLANZapine (ZYPREXA) 10 MG tablet Take 10 mg by mouth daily.     rosuvastatin (CRESTOR) 20 MG tablet Take 20 mg by mouth daily.     venlafaxine XR (EFFEXOR-XR) 150 MG 24 hr capsule Take 300 mg by mouth daily with breakfast.     No facility-administered medications prior to visit.    PAST MEDICAL HISTORY: Past Medical History:  Diagnosis Date   Abnormal stress electrocardiogram test    Allergy    ANA positive    Atypical nevi    Depression    Headache(784.0)    Hives 2011   x5   Hyperlipidemia    Lichen sclerosus 2024   dx by GYN per patient   Menopause    OSA (obstructive sleep apnea)    Osteoarthritis    Right wrist  fracture    Rosacea    Shingles 03/2012   Shortness of breath    Sleep apnea    Urticaria     PAST SURGICAL HISTORY: Past Surgical History:  Procedure Laterality Date   ORIF WRIST FRACTURE     thymic cyst     removal   WISDOM TOOTH EXTRACTION      FAMILY HISTORY: Family History  Problem Relation Age of Onset   Glaucoma Mother    Hyperlipidemia Mother    Depression Mother    Osteoporosis Mother    Breast cancer Mother        21y   Dementia Mother    Cancer Mother    Heart disease Father    Hyperlipidemia Father    Hypertension Father    Heart defect Sister    Heart disease Sister 52       congenital heart defect,deceased   Hyperlipidemia Brother    Depression Brother    High Cholesterol Brother    Sleep apnea Brother    Cancer Paternal Grandmother    Stroke Paternal Grandfather    Heart disease Paternal Grandfather     SOCIAL HISTORY: Social History   Socioeconomic History   Marital status: Married    Spouse name: Sonny   Number of children: 0   Years of education: 16   Highest education level: Not on file  Occupational History    Employer: [employer]  Tobacco Use   Smoking status: Never    Passive exposure: Never   Smokeless tobacco: Never  Vaping Use   Vaping status: Never Used  Substance and Sexual Activity   Alcohol use: No   Drug use: Never   Sexual activity: Not Currently    Birth control/protection: Post-menopausal  Other Topics Concern   Not on file  Social History Narrative   Patient works for [employer].(full-time_    Patient lives at home with her husband Loleta Books).   Patient does not have any children.   Patient is right-handed.   Patient drinks one cup of coffee everyday and two cups of tea daily.   Social Drivers of Corporate investment banker Strain: Not on file  Food Insecurity: Not on file  Transportation Needs: Not on file  Physical Activity: Not on file  Stress: Not on file  Social  Connections: Not on file  Intimate Partner Violence: Not on file      PHYSICAL EXAM  There were no vitals filed for this visit.   There is no height or weight on file to calculate BMI.  Generalized: Well developed, in no acute distress  Chest: Lungs clear to auscultation bilaterally  Neurological examination  Mentation: Alert oriented to time, place, history taking. Follows all commands speech and language fluent Cranial nerve II-XII: Facial symmetry noted  DIAGNOSTIC DATA (LABS, IMAGING, TESTING) - I reviewed patient records, labs, notes, testing and imaging myself where available.  Lab Results  Component Value Date   WBC 6.1 11/06/2022   HGB 14.0 11/06/2022   HCT 42.5 11/06/2022   MCV 89.5 11/06/2022   PLT 296 11/06/2022      Component Value Date/Time   NA 143 11/06/2022 1346   K 3.8 11/06/2022 1346   CL 107 11/06/2022 1346   CO2 27 11/06/2022 1346   GLUCOSE 76 11/06/2022 1346   BUN 14 11/06/2022 1346   CREATININE 0.80 11/06/2022 1346   CALCIUM 10.3 11/06/2022 1346   PROT 6.9 11/06/2022 1346   ALBUMIN 4.2 07/18/2020 1155   AST 23 11/06/2022 1346   AST 24 07/18/2020 1155   ALT 37 (H) 11/06/2022 1346   ALT 36 07/18/2020 1155   ALKPHOS 48 07/18/2020 1155   BILITOT 0.5 11/06/2022 1346   BILITOT 0.3 07/18/2020 1155   GFRNONAA >60 07/18/2020 1155   GFRNONAA 71 06/14/2020 1426   GFRAA 82 06/14/2020 1426   Lab Results  Component Value Date   CHOL 188 11/13/2013   HDL 68 11/13/2013   LDLCALC 104 (H) 11/13/2013   TRIG 82 11/13/2013   CHOLHDL 2.8 11/13/2013   No results found for: "HGBA1C" No results found for: "VITAMINB12" Lab Results  Component Value Date   TSH 1.684 11/13/2013      ASSESSMENT AND PLAN 68 y.o. year old female  has a past medical history of Abnormal stress electrocardiogram test, Allergy, ANA positive, Atypical nevi, Depression, Headache(784.0), Hives (2011), Hyperlipidemia, Lichen sclerosus (2024), Menopause, OSA (obstructive sleep  apnea), Osteoarthritis, Right wrist fracture, Rosacea, Shingles (03/2012), Shortness of breath, Sleep apnea, and Urticaria. here with:  OSA on BiPAP  - BiPAP compliance excellent -Residual AHI slightly elevated most likely due to leakage -Continue using BIPAP nightly and > 4 hours each night -Order sent for new machine and mask refitting - FU in 1 year or sooner if needed   Butch Penny, MSN, NP-C 03/31/2023, 1:38 PM [facility] [address] [place], [place] [address] [phone]

## 2023-04-01 ENCOUNTER — Telehealth (INDEPENDENT_AMBULATORY_CARE_PROVIDER_SITE_OTHER): Admitting: Family Medicine

## 2023-04-01 ENCOUNTER — Encounter: Payer: Self-pay | Admitting: Family Medicine

## 2023-04-01 ENCOUNTER — Telehealth: Payer: Medicare Other | Admitting: Adult Health

## 2023-04-01 DIAGNOSIS — G4733 Obstructive sleep apnea (adult) (pediatric): Secondary | ICD-10-CM

## 2023-04-01 NOTE — Progress Notes (Signed)
   PATIENT: Brianna Ross DOB: [birth year]  REASON FOR VISIT: follow up HISTORY FROM: patient  Virtual Visit via MyChart video  I connected with Brianna Ross on 04/01/23 at  8:15 AM EDT via MyChart video and verified that I am speaking with the correct person using two identifiers.   I discussed the limitations, risks, security and privacy concerns of performing an evaluation and management service by Mychart video and the availability of in person appointments. I also discussed with the patient that there may be a patient responsible charge related to this service. The patient expressed understanding and agreed to proceed.   History of Present Illness:  04/01/23 ALL: Brianna Ross is a 68 y.o. female with a history of OSA on BIPAP. Followed by Butch Penny, NP. She recently received new BiPAP machine 01/29/2023. She reports adjusting well. She was switched to a F20 FFM in size medium. She likes how it fits but continues to note a significant air leak. She feels headgear is as tight as she can tolerate it. She remembers being told by RT at previous sleep study that he needed a small/wide fit FFM. Otherwise doing well.        Observations/Objective:  Generalized: Well developed, in no acute distress  Mentation: Alert oriented to time, place, history taking. Follows all commands speech and language fluent   Assessment and Plan:  68 y.o. year old female  has a past medical history of Abnormal stress electrocardiogram test, Allergy, ANA positive, Atypical nevi, Depression, Headache(784.0), Hives (2011), Hyperlipidemia, Lichen sclerosus (2024), Menopause, OSA (obstructive sleep apnea), Osteoarthritis, Right wrist fracture, Rosacea, Shingles (03/2012), Shortness of breath, Sleep apnea, and Urticaria. here with    ICD-10-CM   1. OSA treated with BiPAP  G47.33      [name] is doing well with new BiPAP machine. Compliance report shows excellent compliance. AHI remains slightly elevated. She has a  significant leak in mask. Now using F20 size medium. I have encouraged her to reach out to [employer] and discuss mask fit. She was encouraged to continue using BiPAP daily for at least 4 hours. We will bring her back for reevaluation with Megan in 4 months. May consider pressure adjustments versus titration study if needed. She verbalizes understanding with this plan.    No orders of the defined types were placed in this encounter.   No orders of the defined types were placed in this encounter.    Follow Up Instructions:  I discussed the assessment and treatment plan with the patient. The patient was provided an opportunity to ask questions and all were answered. The patient agreed with the plan and demonstrated an understanding of the instructions.   The patient was advised to call back or seek an in-person evaluation if the symptoms worsen or if the condition fails to improve as anticipated.  I provided 15 minutes of face-to-face and non face-to-face time during this MyChart video encounter. Patient located at their place of residence. Provider is in the office.    Shawnie Dapper, NP

## 2023-04-01 NOTE — Patient Instructions (Addendum)
 Please continue using your BiPAP regularly. While your insurance requires that you use BiPAP at least 4 hours each night on 70% of the nights, I recommend, that you not skip any nights and use it throughout the night if you can. Getting used to BiPAP and staying with the treatment long term does take time and patience and discipline. Untreated obstructive sleep apnea when it is moderate to severe can have an adverse impact on cardiovascular health and raise her risk for heart disease, arrhythmias, hypertension, congestive heart failure, stroke and diabetes. Untreated obstructive sleep apnea causes sleep disruption, nonrestorative sleep, and sleep deprivation. This can have an impact on your day to day functioning and cause daytime sleepiness and impairment of cognitive function, memory loss, mood disturbance, and problems focussing. Using BiPAP regularly can improve these symptoms.  Please reach out to [facility] for mask assessment. Let them know you were previously using a small/wide fit full face mask. Let me know if you need me!  DME: [facility]  Phone: [phone], press option 1  Follow up in 4 months with [facility]

## 2023-04-05 DIAGNOSIS — G4731 Primary central sleep apnea: Secondary | ICD-10-CM | POA: Diagnosis not present

## 2023-04-09 DIAGNOSIS — F411 Generalized anxiety disorder: Secondary | ICD-10-CM | POA: Diagnosis not present

## 2023-04-09 DIAGNOSIS — F332 Major depressive disorder, recurrent severe without psychotic features: Secondary | ICD-10-CM | POA: Diagnosis not present

## 2023-04-09 DIAGNOSIS — F061 Catatonic disorder due to known physiological condition: Secondary | ICD-10-CM | POA: Diagnosis not present

## 2023-04-09 DIAGNOSIS — F5101 Primary insomnia: Secondary | ICD-10-CM | POA: Diagnosis not present

## 2023-04-11 ENCOUNTER — Ambulatory Visit: Payer: Medicare Other | Attending: Rheumatology | Admitting: Rheumatology

## 2023-04-11 ENCOUNTER — Encounter: Payer: Self-pay | Admitting: Rheumatology

## 2023-04-11 VITALS — BP 119/78 | HR 102 | Resp 14 | Ht 64.5 in | Wt 185.0 lb

## 2023-04-11 DIAGNOSIS — R760 Raised antibody titer: Secondary | ICD-10-CM

## 2023-04-11 DIAGNOSIS — Z8669 Personal history of other diseases of the nervous system and sense organs: Secondary | ICD-10-CM

## 2023-04-11 DIAGNOSIS — F322 Major depressive disorder, single episode, severe without psychotic features: Secondary | ICD-10-CM

## 2023-04-11 DIAGNOSIS — Z8639 Personal history of other endocrine, nutritional and metabolic disease: Secondary | ICD-10-CM

## 2023-04-11 DIAGNOSIS — M170 Bilateral primary osteoarthritis of knee: Secondary | ICD-10-CM

## 2023-04-11 DIAGNOSIS — L719 Rosacea, unspecified: Secondary | ICD-10-CM

## 2023-04-11 DIAGNOSIS — M19042 Primary osteoarthritis, left hand: Secondary | ICD-10-CM

## 2023-04-11 DIAGNOSIS — Z79899 Other long term (current) drug therapy: Secondary | ICD-10-CM

## 2023-04-11 DIAGNOSIS — Z8619 Personal history of other infectious and parasitic diseases: Secondary | ICD-10-CM

## 2023-04-11 DIAGNOSIS — M359 Systemic involvement of connective tissue, unspecified: Secondary | ICD-10-CM

## 2023-04-11 DIAGNOSIS — M19041 Primary osteoarthritis, right hand: Secondary | ICD-10-CM

## 2023-04-11 DIAGNOSIS — M8589 Other specified disorders of bone density and structure, multiple sites: Secondary | ICD-10-CM | POA: Diagnosis not present

## 2023-04-12 ENCOUNTER — Telehealth: Payer: Medicare Other | Admitting: Adult Health

## 2023-04-12 DIAGNOSIS — G4733 Obstructive sleep apnea (adult) (pediatric): Secondary | ICD-10-CM | POA: Diagnosis not present

## 2023-04-12 DIAGNOSIS — G4731 Primary central sleep apnea: Secondary | ICD-10-CM | POA: Diagnosis not present

## 2023-04-13 NOTE — Progress Notes (Signed)
 Anticardiolipin antibodies and beta-2 GP 1 antibodies remain positive at higher titer.  Urine protein creatinine ratio normal, CBC normal, CMP normal, double-stranded DNA negative, complements normal, sed rate normal.  Lupus anticoagulant pending.

## 2023-04-13 NOTE — Progress Notes (Signed)
 Liver function test is mildly elevated.  Beta-2 GP 1 remains elevated at high titer.

## 2023-04-16 LAB — PROTEIN / CREATININE RATIO, URINE
Creatinine, Urine: 47 mg/dL (ref 20–275)
Protein/Creat Ratio: 128 mg/g{creat} (ref 24–184)
Protein/Creatinine Ratio: 0.128 mg/mg{creat} (ref 0.024–0.184)
Total Protein, Urine: 6 mg/dL (ref 5–24)

## 2023-04-16 LAB — THROMBIN CLOTTING TIME: Thrombin Clotting Time: 18 s (ref 13–19)

## 2023-04-16 LAB — LUPUS ANTICOAGULANT EVAL W/ REFLEX
PTT-LA Screen: 42 s — ABNORMAL HIGH (ref <=40)
dRVVT: 42 s (ref <=45)

## 2023-04-16 LAB — COMPREHENSIVE METABOLIC PANEL WITH GFR
AG Ratio: 2.3 (calc) (ref 1.0–2.5)
ALT: 38 U/L — ABNORMAL HIGH (ref 6–29)
AST: 25 U/L (ref 10–35)
Albumin: 4.5 g/dL (ref 3.6–5.1)
Alkaline phosphatase (APISO): 54 U/L (ref 37–153)
BUN: 18 mg/dL (ref 7–25)
CO2: 27 mmol/L (ref 20–32)
Calcium: 10.2 mg/dL (ref 8.6–10.4)
Chloride: 107 mmol/L (ref 98–110)
Creat: 0.8 mg/dL (ref 0.50–1.05)
Globulin: 2 g/dL (ref 1.9–3.7)
Glucose, Bld: 83 mg/dL (ref 65–99)
Potassium: 3.6 mmol/L (ref 3.5–5.3)
Sodium: 144 mmol/L (ref 135–146)
Total Bilirubin: 0.5 mg/dL (ref 0.2–1.2)
Total Protein: 6.5 g/dL (ref 6.1–8.1)
eGFR: 81 mL/min/{1.73_m2} (ref >=60)

## 2023-04-16 LAB — CBC WITH DIFFERENTIAL/PLATELET
Absolute Lymphocytes: 1484 {cells}/uL (ref 850–3900)
Absolute Monocytes: 824 {cells}/uL (ref 200–950)
Basophils Absolute: 78 {cells}/uL (ref 0–200)
Basophils Relative: 1.1 %
Eosinophils Absolute: 369 {cells}/uL (ref 15–500)
Eosinophils Relative: 5.2 %
HCT: 42.9 % (ref 35.0–45.0)
Hemoglobin: 14.4 g/dL (ref 11.7–15.5)
MCH: 29.8 pg (ref 27.0–33.0)
MCHC: 33.6 g/dL (ref 32.0–36.0)
MCV: 88.8 fL (ref 80.0–100.0)
MPV: 10 fL (ref 7.5–12.5)
Monocytes Relative: 11.6 %
Neutro Abs: 4345 {cells}/uL (ref 1500–7800)
Neutrophils Relative %: 61.2 %
Platelets: 274 10*3/uL (ref 140–400)
RBC: 4.83 10*6/uL (ref 3.80–5.10)
RDW: 12.1 % (ref 11.0–15.0)
Total Lymphocyte: 20.9 %
WBC: 7.1 10*3/uL (ref 3.8–10.8)

## 2023-04-16 LAB — CARDIOLIPIN ANTIBODIES, IGG, IGM, IGA
Anticardiolipin IgA: >65 [APL'U]/mL — ABNORMAL HIGH (ref <20.0)
Anticardiolipin IgG: 39.8 [GPL'U]/mL — ABNORMAL HIGH (ref <20.0)
Anticardiolipin IgM: 5.4 [MPL'U]/mL (ref <20.0)

## 2023-04-16 LAB — C3 AND C4
C3 Complement: 135 mg/dL (ref 83–193)
C4 Complement: 19 mg/dL (ref 15–57)

## 2023-04-16 LAB — RFLX HEXAGONAL PHASE CONFIRM: Hexagonal Phase Conf: POSITIVE — AB

## 2023-04-16 LAB — SEDIMENTATION RATE: Sed Rate: 2 mm/h (ref 0–30)

## 2023-04-16 LAB — BETA-2 GLYCOPROTEIN ANTIBODIES
Beta-2 Glyco 1 IgA: >65 U/mL — ABNORMAL HIGH (ref <20.0)
Beta-2 Glyco 1 IgM: 6.2 U/mL (ref <20.0)
Beta-2 Glyco I IgG: 79.5 U/mL — ABNORMAL HIGH (ref <20.0)

## 2023-04-16 LAB — ANTI-DNA ANTIBODY, DOUBLE-STRANDED: ds DNA Ab: 1 [IU]/mL

## 2023-04-17 NOTE — Progress Notes (Signed)
 Lupus anticoagulant is positive.  No change in treatment advised.

## 2023-04-22 ENCOUNTER — Other Ambulatory Visit: Payer: Self-pay | Admitting: Physician Assistant

## 2023-04-22 DIAGNOSIS — M359 Systemic involvement of connective tissue, unspecified: Secondary | ICD-10-CM

## 2023-04-22 NOTE — Telephone Encounter (Signed)
 Last Fill: 01/22/2023  Eye exam: 06/20/2022   Labs:04/11/2023 Anticardiolipin antibodies and beta-2  GP 1 antibodies remain positive at higher titer.  Urine protein creatinine ratio normal, CBC normal, CMP normal, double-stranded DNA negative, complements normal, sed rate normal.  Liver function test is mildly elevated.  Beta-2  GP 1 remains elevated at high titer.   Lupus anticoagulant is positive.  No change in treatment advised.   Next Visit: 10/17/2023  Last Visit: 04/11/2023   [id number] disease   Current Dose per office note 04/11/2023: Plaquenil  200 mg 1 tablet by mouth daily.   Okay to refill Plaquenil ?

## 2023-04-29 DIAGNOSIS — G4733 Obstructive sleep apnea (adult) (pediatric): Secondary | ICD-10-CM | POA: Diagnosis not present

## 2023-04-29 DIAGNOSIS — G4731 Primary central sleep apnea: Secondary | ICD-10-CM | POA: Diagnosis not present

## 2023-05-14 DIAGNOSIS — Z1231 Encounter for screening mammogram for malignant neoplasm of breast: Secondary | ICD-10-CM | POA: Diagnosis not present

## 2023-05-20 DIAGNOSIS — F5101 Primary insomnia: Secondary | ICD-10-CM | POA: Diagnosis not present

## 2023-05-20 DIAGNOSIS — F411 Generalized anxiety disorder: Secondary | ICD-10-CM | POA: Diagnosis not present

## 2023-05-20 DIAGNOSIS — F332 Major depressive disorder, recurrent severe without psychotic features: Secondary | ICD-10-CM | POA: Diagnosis not present

## 2023-05-20 DIAGNOSIS — F061 Catatonic disorder due to known physiological condition: Secondary | ICD-10-CM | POA: Diagnosis not present

## 2023-05-22 DIAGNOSIS — G4731 Primary central sleep apnea: Secondary | ICD-10-CM | POA: Diagnosis not present

## 2023-05-22 DIAGNOSIS — G4733 Obstructive sleep apnea (adult) (pediatric): Secondary | ICD-10-CM | POA: Diagnosis not present

## 2023-06-03 DIAGNOSIS — G4733 Obstructive sleep apnea (adult) (pediatric): Secondary | ICD-10-CM | POA: Diagnosis not present

## 2023-06-03 DIAGNOSIS — G4731 Primary central sleep apnea: Secondary | ICD-10-CM | POA: Diagnosis not present

## 2023-06-12 IMAGING — DX DG FOREARM 2V*R*
2 series · 2 of 2 positions shown · non-contrast
Comparison: Right wrist series 08/05/2014.

CLINICAL DATA: 64-year-old female status post fall landing on
broken glass. Laceration, query foreign body.

EXAM:
RIGHT FOREARM - 2 VIEW

[forearm ap]
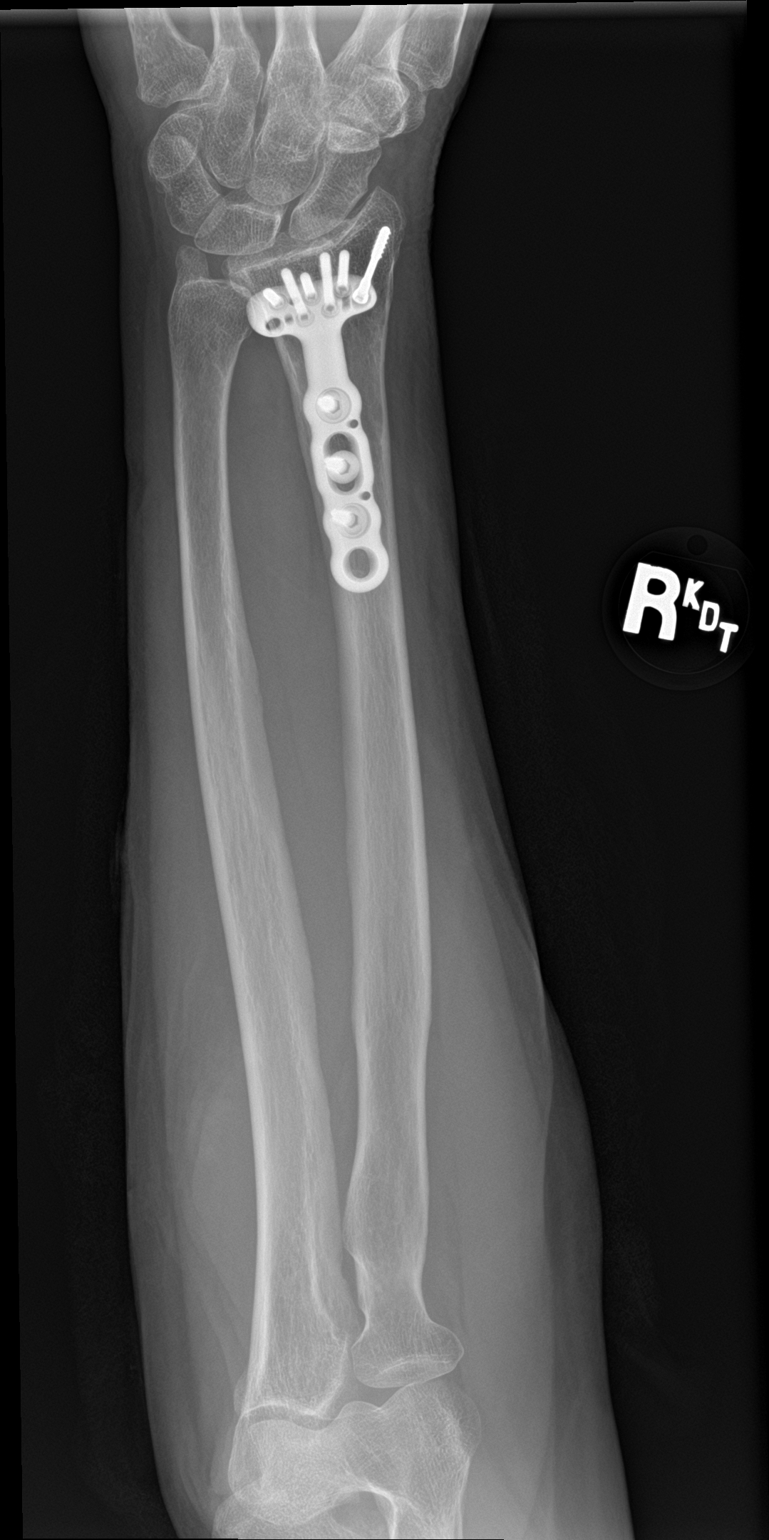

[forearm lat]
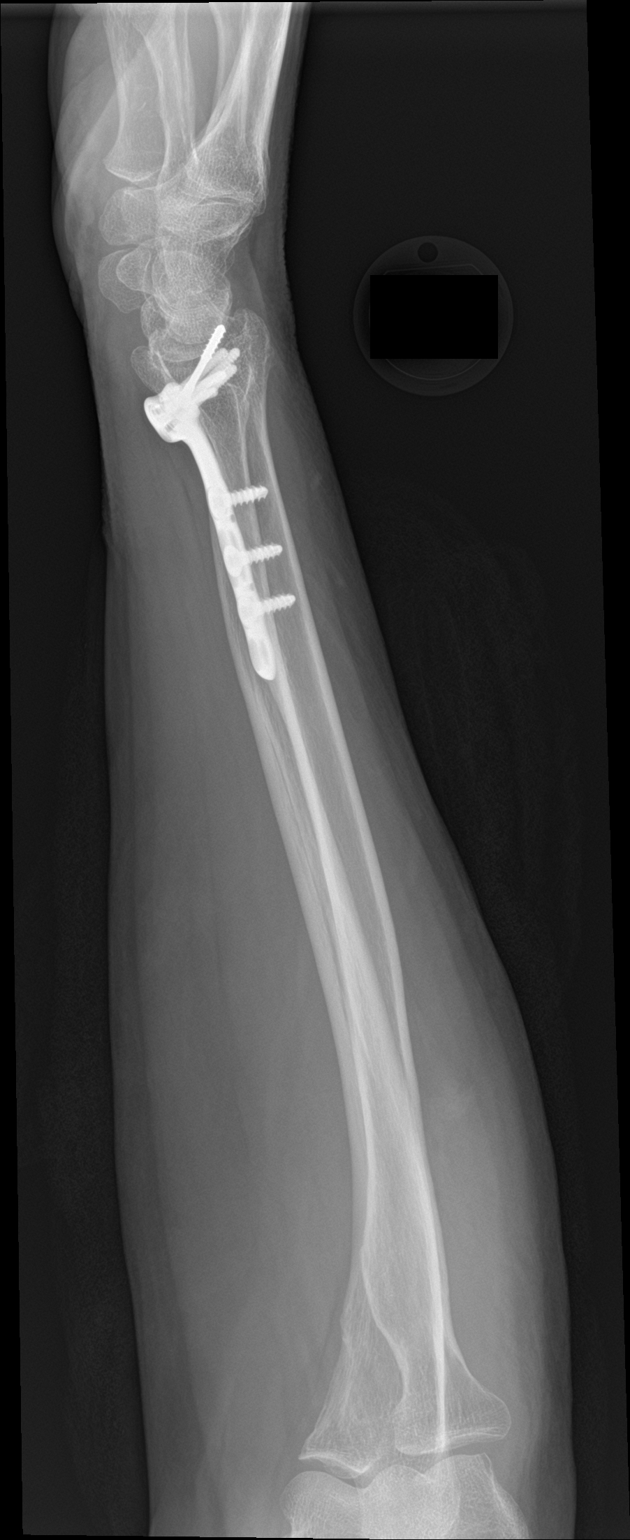

[2 of 2 positions shown; findings below may reference images not displayed]

FINDINGS: Chronic distal radius ORIF appears stable and intact. No right
humerus or ulna fracture identified. Alignment appears maintained at
the right wrist and elbow.

No soft tissue gas. Questionable small indistinct radiopaque foreign
bodies, 3-4 mm (arrows) along the dorsal distal forearm. One of
these is just distal to the overlying dressing material. No other No
radiopaque foreign body identified.
IMPRESSION: 1. Difficult to exclude two small 3-4 mm glass fragments at the
dorsal distal forearm (arrows).
2. No acute fracture or dislocation identified about the right
forearm. Chronic distal radius ORIF.

## 2023-06-17 DIAGNOSIS — Z79899 Other long term (current) drug therapy: Secondary | ICD-10-CM | POA: Diagnosis not present

## 2023-06-17 DIAGNOSIS — H40012 Open angle with borderline findings, low risk, left eye: Secondary | ICD-10-CM | POA: Diagnosis not present

## 2023-06-20 DIAGNOSIS — Z6831 Body mass index (BMI) 31.0-31.9, adult: Secondary | ICD-10-CM | POA: Diagnosis not present

## 2023-06-20 DIAGNOSIS — Z01419 Encounter for gynecological examination (general) (routine) without abnormal findings: Secondary | ICD-10-CM | POA: Diagnosis not present

## 2023-06-22 DIAGNOSIS — G4733 Obstructive sleep apnea (adult) (pediatric): Secondary | ICD-10-CM | POA: Diagnosis not present

## 2023-06-22 DIAGNOSIS — G4731 Primary central sleep apnea: Secondary | ICD-10-CM | POA: Diagnosis not present

## 2023-06-24 DIAGNOSIS — K080 Exfoliation of teeth due to systemic causes: Secondary | ICD-10-CM | POA: Diagnosis not present

## 2023-06-27 DIAGNOSIS — F332 Major depressive disorder, recurrent severe without psychotic features: Secondary | ICD-10-CM | POA: Diagnosis not present

## 2023-06-27 DIAGNOSIS — F411 Generalized anxiety disorder: Secondary | ICD-10-CM | POA: Diagnosis not present

## 2023-06-27 DIAGNOSIS — F5101 Primary insomnia: Secondary | ICD-10-CM | POA: Diagnosis not present

## 2023-06-27 DIAGNOSIS — F061 Catatonic disorder due to known physiological condition: Secondary | ICD-10-CM | POA: Diagnosis not present

## 2023-07-01 DIAGNOSIS — E785 Hyperlipidemia, unspecified: Secondary | ICD-10-CM | POA: Diagnosis not present

## 2023-07-01 DIAGNOSIS — E7800 Pure hypercholesterolemia, unspecified: Secondary | ICD-10-CM | POA: Diagnosis not present

## 2023-07-01 DIAGNOSIS — F3341 Major depressive disorder, recurrent, in partial remission: Secondary | ICD-10-CM | POA: Diagnosis not present

## 2023-07-03 DIAGNOSIS — G4733 Obstructive sleep apnea (adult) (pediatric): Secondary | ICD-10-CM | POA: Diagnosis not present

## 2023-07-03 DIAGNOSIS — G4731 Primary central sleep apnea: Secondary | ICD-10-CM | POA: Diagnosis not present

## 2023-07-08 ENCOUNTER — Encounter: Payer: Self-pay | Admitting: Adult Health

## 2023-07-08 ENCOUNTER — Ambulatory Visit: Admitting: Adult Health

## 2023-07-08 VITALS — BP 126/85 | HR 82 | Ht 64.5 in | Wt 187.0 lb

## 2023-07-08 DIAGNOSIS — G4733 Obstructive sleep apnea (adult) (pediatric): Secondary | ICD-10-CM | POA: Diagnosis not present

## 2023-07-08 NOTE — Progress Notes (Signed)
 PATIENT: Brianna Ross DOB: [birth year]  REASON FOR VISIT: follow up HISTORY FROM: patient PRIMARY NEUROLOGIST: Dr. Chalice  Chief Complaint  Patient presents with   Follow-up    RM17 -alone ESS-5    HISTORY OF PRESENT ILLNESS: Today 07/08/23:  Brianna Ross is a 68 y.o. female with a history of OSA On BiPAP. Returns today for follow-up.  She reports that the BiPAP is working well for her.  She does have a leak according to her download.  She states that she sometimes feel the air blowing on her face.  Her download is below       11/22/22: Brianna Ross is a 68 y.o. female with a history of obstructive sleep apnea on CPAP. Returns today for follow-up.  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  She states that it is leaking a lot.  She was told by her insurance company that she would qualify for new machine this year however she states that she has been denied again.  Her download is below    11/15/21: Brianna Ross is a 68 year old female with a history of obstructive sleep apnea on BiPAP.  She returns today for follow-up.  She reports that BiPAP is working well.  She states that her machine is over 75 years old and would like a new machine.  Looks like an order was placed in October however she states DME has not contacted her about this.  Download is below.   05/10/21: Brianna Ross is a 68 year old female with a history of obstructive sleep apnea on BiPAP.  She returns today for follow-up.  At the last visit she was sent for BiPAP titration and an order was sent to her DME company to adjust her pressure however it appears this was never done.  Patient's download is below    08/11/20: Brianna Ross is a 68 year old female with a history of obstructive sleep apnea on BiPAP.  Her download indicates that he uses machine 29 out of 30 days for compliance of 97%.  She uses machine greater than 4 hours 26 days for compliance of 87%.  On average she uses her machine 7 hours and 52 minutes.  Her residual  AHI is 30.5 on 16/10 centimeters of water her leak in the 95th percentile is 84.4 L/min.  The patient states that she has a [employer] fullface DreamWear mask she does not like the air blowing in her nostrils as she finds it very uncomfortable.    REVIEW OF SYSTEMS: Out of a complete 14 system review of symptoms, the patient complains only of the following symptoms, and all other reviewed systems are negative.   ESS 3  ALLERGIES: No Known Allergies  HOME MEDICATIONS: Outpatient Medications Prior to Visit  Medication Sig Dispense Refill   acetaminophen  (TYLENOL ) 500 MG tablet Take 500 mg by mouth 2 (two) times daily as needed (pain).     aspirin  EC 81 MG tablet Take 81 mg by mouth 2 (two) times daily. Swallow whole.     buPROPion (WELLBUTRIN XL) 150 MG 24 hr tablet Take 450 mg by mouth daily.     calcium carbonate (OS-CAL) 600 MG tablet Take 600 mg by mouth in the morning and at bedtime.     Cholecalciferol (VITAMIN D ) 2000 units tablet Take 4,000 Units by mouth daily.      clobetasol ointment (TEMOVATE) 0.05 % APPLY A THIN LAYER TO THE AFFECTED AREA(S) BY TOPICAL ROUTE 2 TIMES PER DAY     fluorometholone (FML)  0.1 % ophthalmic suspension Place 1 drop into the left eye daily.  5   hydroxychloroquine  (PLAQUENIL ) 200 MG tablet TAKE 1 TABLET BY MOUTH DAILY 90 tablet 0   Multiple Vitamin (MULTIVITAMIN PO) Take by mouth daily.     NONFORMULARY OR COMPOUNDED ITEM daily. Rosacea cream (azelaic acid 15%, metronidazole 1%, ivermectin 1%)     OLANZapine  (ZYPREXA ) 10 MG tablet Take 10 mg by mouth daily.     rosuvastatin (CRESTOR) 20 MG tablet Take 20 mg by mouth daily.     venlafaxine  XR (EFFEXOR -XR) 150 MG 24 hr capsule Take 300 mg by mouth daily with breakfast.     No facility-administered medications prior to visit.    PAST MEDICAL HISTORY: Past Medical History:  Diagnosis Date   Abnormal stress electrocardiogram test    Allergy    ANA positive    Atypical nevi    Depression     Headache(784.0)    Hives 2011   x5   Hyperlipidemia    Lichen sclerosus 2024   dx by GYN per patient   Menopause    OSA (obstructive sleep apnea)    Osteoarthritis    Right wrist fracture    Rosacea    Shingles 03/2012   Shortness of breath    Sleep apnea    Urticaria     PAST SURGICAL HISTORY: Past Surgical History:  Procedure Laterality Date   ORIF WRIST FRACTURE     thymic cyst     removal   WISDOM TOOTH EXTRACTION      FAMILY HISTORY: Family History  Problem Relation Age of Onset   Glaucoma Mother    Hyperlipidemia Mother    Depression Mother    Osteoporosis Mother    Breast cancer Mother        28y   Dementia Mother    Cancer Mother    Heart disease Father    Hyperlipidemia Father    Hypertension Father    Heart defect Sister    Heart disease Sister 38       congenital heart defect,deceased   Hyperlipidemia Brother    Depression Brother    High Cholesterol Brother    Sleep apnea Brother    Cancer Paternal Grandmother    Stroke Paternal Grandfather    Heart disease Paternal Grandfather     SOCIAL HISTORY: Social History   Socioeconomic History   Marital status: Married    Spouse name: Sonny   Number of children: 0   Years of education: 16   Highest education level: Not on file  Occupational History    Employer: [employer]  Tobacco Use   Smoking status: Never    Passive exposure: Never   Smokeless tobacco: Never  Vaping Use   Vaping status: Never Used  Substance and Sexual Activity   Alcohol use: No   Drug use: Never   Sexual activity: Not Currently    Birth control/protection: Post-menopausal  Other Topics Concern   Not on file  Social History Narrative   Patient works for [employer].(full-time_    Patient lives at home with her husband Redell).   Patient does not have any children.   Patient is right-handed.   Patient drinks one cup of coffee everyday and two cups of tea daily.   Social  Drivers of Corporate investment banker Strain: Not on file  Food Insecurity: Not on file  Transportation Needs: Not on file  Physical Activity: Not on file  Stress: Not  on file  Social Connections: Not on file  Intimate Partner Violence: Not on file      PHYSICAL EXAM  Vitals:   07/08/23 1131  BP: 126/85  Pulse: 82  Weight: 187 lb (84.8 kg)  Height: 5' 4.5 (1.638 m)     Body mass index is 31.6 kg/m.  Generalized: Well developed, in no acute distress  Chest: Lungs clear to auscultation bilaterally  Neurological examination  Mentation: Alert oriented to time, place, history taking. Follows all commands speech and language fluent Cranial nerve II-XII: Facial symmetry noted  DIAGNOSTIC DATA (LABS, IMAGING, TESTING) - I reviewed patient records, labs, notes, testing and imaging myself where available.  Lab Results  Component Value Date   WBC 7.1 04/11/2023   HGB 14.4 04/11/2023   HCT 42.9 04/11/2023   MCV 88.8 04/11/2023   PLT 274 04/11/2023      Component Value Date/Time   NA 144 04/11/2023 1421   K 3.6 04/11/2023 1421   CL 107 04/11/2023 1421   CO2 27 04/11/2023 1421   GLUCOSE 83 04/11/2023 1421   BUN 18 04/11/2023 1421   CREATININE 0.80 04/11/2023 1421   CALCIUM 10.2 04/11/2023 1421   PROT 6.5 04/11/2023 1421   ALBUMIN 4.2 07/18/2020 1155   AST 25 04/11/2023 1421   AST 24 07/18/2020 1155   ALT 38 (H) 04/11/2023 1421   ALT 36 07/18/2020 1155   ALKPHOS 48 07/18/2020 1155   BILITOT 0.5 04/11/2023 1421   BILITOT 0.3 07/18/2020 1155   GFRNONAA >60 07/18/2020 1155   GFRNONAA 71 06/14/2020 1426   GFRAA 82 06/14/2020 1426   Lab Results  Component Value Date   CHOL 188 11/13/2013   HDL 68 11/13/2013   LDLCALC 104 (H) 11/13/2013   TRIG 82 11/13/2013   CHOLHDL 2.8 11/13/2013   No results found for: HGBA1C No results found for: VITAMINB12 Lab Results  Component Value Date   TSH 1.684 11/13/2013      ASSESSMENT AND PLAN 68 y.o. year old  female  has a past medical history of Abnormal stress electrocardiogram test, Allergy, ANA positive, Atypical nevi, Depression, Headache(784.0), Hives (2011), Hyperlipidemia, Lichen sclerosus (2024), Menopause, OSA (obstructive sleep apnea), Osteoarthritis, Right wrist fracture, Rosacea, Shingles (03/2012), Shortness of breath, Sleep apnea, and Urticaria. here with:  OSA on BiPAP  - BiPAP compliance excellent -Residual AHI has increased to 12 previously was around 9. -Continue using BIPAP nightly and > 4 hours each night - Order placed for mask refitting -Will pull a download in 30-45 days to see if there is any improvement - FU in 1 year or sooner if needed   Duwaine Russell, MSN, NP-C 07/08/2023, 11:37 AM [facility] [address], [address] [place], [place] [address] [phone]

## 2023-07-09 NOTE — Telephone Encounter (Signed)
 RE: mask refit Received: Today New, [name], RN; [name]; [name], [name]; 1 other Received, thank you!     Previous Messages    ----- Message ----- From: Neysa Nena GORMAN, RN Sent: 07/09/2023   8:49 AM EDT To: Adine Joylene; Avelina Jackson; Ephraim Dollar* Subject: mask refit                                    Good morning,  new order in epic for pt,  mask refitting  Brianna Ross Female, 68 y.o., 12-07-1955 Pronouns: she/her/hers MRN: [id number]  Brianna Ross

## 2023-07-15 DIAGNOSIS — Z0000 Encounter for general adult medical examination without abnormal findings: Secondary | ICD-10-CM | POA: Diagnosis not present

## 2023-07-15 DIAGNOSIS — Z1331 Encounter for screening for depression: Secondary | ICD-10-CM | POA: Diagnosis not present

## 2023-07-17 ENCOUNTER — Encounter: Payer: Self-pay | Admitting: Adult Health

## 2023-07-22 DIAGNOSIS — R768 Other specified abnormal immunological findings in serum: Secondary | ICD-10-CM | POA: Diagnosis not present

## 2023-07-22 DIAGNOSIS — G4733 Obstructive sleep apnea (adult) (pediatric): Secondary | ICD-10-CM | POA: Diagnosis not present

## 2023-07-22 DIAGNOSIS — G4731 Primary central sleep apnea: Secondary | ICD-10-CM | POA: Diagnosis not present

## 2023-07-22 DIAGNOSIS — B0233 Zoster keratitis: Secondary | ICD-10-CM | POA: Diagnosis not present

## 2023-07-22 DIAGNOSIS — F3341 Major depressive disorder, recurrent, in partial remission: Secondary | ICD-10-CM | POA: Diagnosis not present

## 2023-07-22 DIAGNOSIS — Z136 Encounter for screening for cardiovascular disorders: Secondary | ICD-10-CM | POA: Diagnosis not present

## 2023-07-22 DIAGNOSIS — Z0000 Encounter for general adult medical examination without abnormal findings: Secondary | ICD-10-CM | POA: Diagnosis not present

## 2023-07-23 ENCOUNTER — Other Ambulatory Visit: Payer: Self-pay | Admitting: Physician Assistant

## 2023-07-23 ENCOUNTER — Encounter: Payer: Self-pay | Admitting: Adult Health

## 2023-07-23 DIAGNOSIS — M359 Systemic involvement of connective tissue, unspecified: Secondary | ICD-10-CM

## 2023-07-23 NOTE — Telephone Encounter (Signed)
 Last Fill: 04/23/2023  Eye exam:  06/20/2022 WNL   Labs: 04/11/2023 CBC normal, CMP normal,   Next Visit: 10/17/2023  Last Visit: 04/11/2023  IK:Jlunpfflwz disease   Current Dose per office note 04/11/2023: Plaquenil  200 mg 1 tablet by mouth daily.   Patient states she update her eye exam in June 2025. Patient will call to have them send results.   Okay to refill Plaquenil ?

## 2023-07-26 DIAGNOSIS — D225 Melanocytic nevi of trunk: Secondary | ICD-10-CM | POA: Diagnosis not present

## 2023-07-26 DIAGNOSIS — L578 Other skin changes due to chronic exposure to nonionizing radiation: Secondary | ICD-10-CM | POA: Diagnosis not present

## 2023-07-26 DIAGNOSIS — L719 Rosacea, unspecified: Secondary | ICD-10-CM | POA: Diagnosis not present

## 2023-07-26 DIAGNOSIS — L219 Seborrheic dermatitis, unspecified: Secondary | ICD-10-CM | POA: Diagnosis not present

## 2023-07-29 NOTE — Telephone Encounter (Signed)
 noted

## 2023-08-01 DIAGNOSIS — F3341 Major depressive disorder, recurrent, in partial remission: Secondary | ICD-10-CM | POA: Diagnosis not present

## 2023-08-01 DIAGNOSIS — E7800 Pure hypercholesterolemia, unspecified: Secondary | ICD-10-CM | POA: Diagnosis not present

## 2023-08-01 DIAGNOSIS — E785 Hyperlipidemia, unspecified: Secondary | ICD-10-CM | POA: Diagnosis not present

## 2023-08-03 DIAGNOSIS — G4731 Primary central sleep apnea: Secondary | ICD-10-CM | POA: Diagnosis not present

## 2023-08-03 DIAGNOSIS — G4733 Obstructive sleep apnea (adult) (pediatric): Secondary | ICD-10-CM | POA: Diagnosis not present

## 2023-08-19 DIAGNOSIS — M8589 Other specified disorders of bone density and structure, multiple sites: Secondary | ICD-10-CM | POA: Diagnosis not present

## 2023-08-19 LAB — HM DEXA SCAN

## 2023-08-22 DIAGNOSIS — F061 Catatonic disorder due to known physiological condition: Secondary | ICD-10-CM | POA: Diagnosis not present

## 2023-08-22 DIAGNOSIS — F411 Generalized anxiety disorder: Secondary | ICD-10-CM | POA: Diagnosis not present

## 2023-08-22 DIAGNOSIS — F5101 Primary insomnia: Secondary | ICD-10-CM | POA: Diagnosis not present

## 2023-08-22 DIAGNOSIS — F332 Major depressive disorder, recurrent severe without psychotic features: Secondary | ICD-10-CM | POA: Diagnosis not present

## 2023-09-01 DIAGNOSIS — F3341 Major depressive disorder, recurrent, in partial remission: Secondary | ICD-10-CM | POA: Diagnosis not present

## 2023-09-01 DIAGNOSIS — E7800 Pure hypercholesterolemia, unspecified: Secondary | ICD-10-CM | POA: Diagnosis not present

## 2023-09-01 DIAGNOSIS — E785 Hyperlipidemia, unspecified: Secondary | ICD-10-CM | POA: Diagnosis not present

## 2023-09-05 DIAGNOSIS — G4731 Primary central sleep apnea: Secondary | ICD-10-CM | POA: Diagnosis not present

## 2023-09-05 DIAGNOSIS — G4733 Obstructive sleep apnea (adult) (pediatric): Secondary | ICD-10-CM | POA: Diagnosis not present

## 2023-09-18 DIAGNOSIS — H6123 Impacted cerumen, bilateral: Secondary | ICD-10-CM | POA: Diagnosis not present

## 2023-10-01 DIAGNOSIS — E785 Hyperlipidemia, unspecified: Secondary | ICD-10-CM | POA: Diagnosis not present

## 2023-10-01 DIAGNOSIS — E7800 Pure hypercholesterolemia, unspecified: Secondary | ICD-10-CM | POA: Diagnosis not present

## 2023-10-01 DIAGNOSIS — F3341 Major depressive disorder, recurrent, in partial remission: Secondary | ICD-10-CM | POA: Diagnosis not present

## 2023-10-04 NOTE — Progress Notes (Signed)
 Office Visit Note  Patient: Brianna Ross             Date of Birth: Mar 10, 1955           MRN: [id number]             PCP: Brianna Charm, MD Referring: Brianna Ross,* Visit Date: 10/17/2023 Occupation: Data Unavailable  Subjective:  Medication monitoring  History of Present Illness: Brianna Ross is a 68 y.o. female with history of autoimmune disease.  Patient remains on plaquenil  200 mg 1 tablet by mouth daily.  She is tolerating Plaquenil  without any side effects and has not had any gaps in therapy.  She denies any signs or symptoms of a flare.  She has not had any recent rashes, oral nasal ulcerations, sicca symptoms, or Raynaud's phenomenon.  She denies any increased joint pain or joint swelling.  Her energy level has been stable. She continues to take aspirin  81 mg 2 tablets daily. She denies any new medical conditions.   Activities of Daily Living:  Patient reports morning stiffness for  none.   Patient Denies nocturnal pain.  Difficulty dressing/grooming: Denies Difficulty climbing stairs: Denies Difficulty getting out of chair: Denies Difficulty using hands for taps, buttons, cutlery, and/or writing: Denies  Review of Systems  Constitutional:  Negative for fatigue.  HENT:  Negative for mouth sores and mouth dryness.   Eyes:  Negative for dryness.  Respiratory:  Negative for shortness of breath.   Cardiovascular:  Negative for chest pain and palpitations.  Gastrointestinal:  Negative for blood in stool, constipation and diarrhea.  Endocrine: Negative for increased urination.  Genitourinary:  Negative for involuntary urination.  Musculoskeletal:  Negative for joint pain, gait problem, joint pain, joint swelling, myalgias, muscle weakness, morning stiffness, muscle tenderness and myalgias.  Skin:  Negative for color change, rash, hair loss and sensitivity to sunlight.  Allergic/Immunologic: Negative for susceptible to infections.  Neurological:  Negative for  dizziness and headaches.  Hematological:  Negative for swollen glands.  Psychiatric/Behavioral:  Positive for depressed mood. Negative for sleep disturbance. The patient is not nervous/anxious.     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   Positive ANA (antinuclear antibody) 03/06/2012   Elevated aldolase level 03/06/2012   Atypical nevi 11/14/2011   Menopause 11/13/2011   Rosacea 11/13/2011    Past Medical History:  Diagnosis Date   Abnormal stress electrocardiogram test    Allergy    ANA positive    Atypical nevi    Depression    Headache(784.0)    Hives 2011   x5   Hyperlipidemia    Lichen sclerosus 2024   dx by GYN per patient   Menopause    OSA (obstructive sleep apnea)    Osteoarthritis    Right wrist fracture    Rosacea  Shingles 03/2012   Shortness of breath    Sleep apnea    Urticaria     Family History  Problem Relation Age of Onset   Glaucoma Mother    Hyperlipidemia Mother    Depression Mother    Osteoporosis Mother    Breast cancer Mother        43y   Dementia Mother    Cancer Mother    Heart disease Father    Hyperlipidemia Father    Hypertension Father    Heart defect Sister    Heart disease Sister 69        congenital heart defect,deceased   Hyperlipidemia Brother    Depression Brother    High Cholesterol Brother    Sleep apnea Brother    Cancer Paternal Grandmother    Stroke Paternal Grandfather    Heart disease Paternal Grandfather    Past Surgical History:  Procedure Laterality Date   ORIF WRIST FRACTURE     thymic cyst     removal   WISDOM TOOTH EXTRACTION     Social History   Tobacco Use   Smoking status: Never    Passive exposure: Never   Smokeless tobacco: Never  Vaping Use   Vaping status: Never Used  Substance Use Topics   Alcohol use: No   Drug use: Never   Social History   Social History Narrative   Patient works for [employer].(full-time_    Patient lives at home with her husband Brianna Ross).   Patient does not have any children.   Patient is right-handed.   Patient drinks one cup of coffee everyday and two cups of tea daily.     Immunization History  Administered Date(s) Administered   Influenza Split 11/13/2011   Influenza,inj,Quad PF,6+ Mos 11/20/2016   Influenza-Unspecified 09/11/2013   Moderna Sars-Covid-2 Vaccination 02/25/2019, 03/25/2019, 11/24/2019   Pneumococcal Polysaccharide-23 11/12/2012     Objective: Vital Signs: BP 114/80   Pulse 89   Temp 98 F (36.7 C)   Resp 16   Ht 5' 4.5 (1.638 m)   Wt 186 lb 3.2 oz (84.5 kg)   BMI 31.47 kg/m    Physical Exam Vitals and nursing note reviewed.  Constitutional:      Appearance: She is well-developed.  HENT:     Head: Normocephalic and atraumatic.  Eyes:     Conjunctiva/sclera: Conjunctivae normal.  Cardiovascular:     Rate and Rhythm: Normal rate and regular rhythm.     Heart sounds: Normal heart sounds.  Pulmonary:     Effort: Pulmonary effort is normal.     Breath sounds: Normal breath sounds.  Abdominal:     General: Bowel sounds are normal.     Palpations: Abdomen is soft.  Musculoskeletal:     Cervical back: Normal range of motion.  Lymphadenopathy:      Cervical: No cervical adenopathy.  Skin:    General: Skin is warm and dry.     Capillary Refill: Capillary refill takes less than 2 seconds.  Neurological:     Mental Status: She is alert and oriented to person, place, and time.  Psychiatric:        Behavior: Behavior normal.      Musculoskeletal Exam: C-spine limited ROM with lateral rotation. Shoulder joints, elbow joints, wrist joints, MCPs, PIPs, DIPs have good range of motion with no synovitis.  PIP and DIP thickening consistent with ostearthritis of both hands. Complete fist formation bilaterally.  Hip joints have good range of motion  with no groin pain.  Knee joints have good range of motion no warmth or effusion.  Ankle joints have good range of motion no tenderness or joint swelling.  No evidence of Achilles tendinitis or plantar fasciitis.   CDAI Exam: CDAI Score: -- Patient Global: --; Provider Global: -- Swollen: --; Tender: -- Joint Exam 10/17/2023   No joint exam has been documented for this visit   There is currently no information documented on the homunculus. Go to the Rheumatology activity and complete the homunculus joint exam.  Investigation: No additional findings.  Imaging: No results found.  Recent Labs: Lab Results  Component Value Date   WBC 7.1 04/11/2023   HGB 14.4 04/11/2023   PLT 274 04/11/2023   NA 144 04/11/2023   K 3.6 04/11/2023   CL 107 04/11/2023   CO2 27 04/11/2023   GLUCOSE 83 04/11/2023   BUN 18 04/11/2023   CREATININE 0.80 04/11/2023   BILITOT 0.5 04/11/2023   ALKPHOS 48 07/18/2020   AST 25 04/11/2023   ALT 38 (H) 04/11/2023   PROT 6.5 04/11/2023   ALBUMIN 4.2 07/18/2020   CALCIUM 10.2 04/11/2023   GFRAA 82 06/14/2020    Speciality Comments: PLQ Eye Exam: 07/17/2023 WNL @ [facility] Follow up in 1 year  Procedures:  No procedures performed Allergies: Patient has no known allergies.   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  She has clinically been doing well taking Plaquenil  200 g 1 tablet by mouth daily.  She is tolerating Plaquenil  without any side effects and has not had any gaps in therapy.  No synovitis noted on examination today.  Her energy level has been stable.  She has not had any oral or nasal ulcerations.  No sicca symptoms.  No recent rashes.  No symptoms of Raynaud's phenomenon.  No digital ulcerations noted.  No signs of sclerodactyly noted. Lab work from 04/11/2023 was reviewed today in the office: Urine protein creatinine ratio within normal limits, double-stranded a negative, complements within normal limits, ESR within normal limits, beta-2  glycoprotein IgG 79.5, beta-2  glycoprotein IgA greater than 65, anticardiolipin IgA greater than 65, anticardiolipin IgG 39.8, lupus anticoagulant detected. Plan to update the following lab work today for further evaluation.  She will remain on Plaquenil  as prescribed.  She was advised to notify us  if she develops any new or worsening symptoms.  She will follow-up in the office in 5 months or sooner if needed.  - Plan: CBC with Differential/Platelet, Comprehensive metabolic panel with GFR, hydroxychloroquine  (PLAQUENIL ) 200 MG tablet, Sedimentation rate, C3 and C4, Protein / creatinine ratio, urine, Anti-DNA antibody, double-stranded, Lupus Anticoagulant Eval w/Reflex, Cardiolipin antibodies, IgG, IgM, IgA, Beta-2  glycoprotein antibodies  High risk medication use - Plaquenil  200 mg 1 tablet by mouth daily.  PLQ Eye Exam: 07/17/2023 WNL @ [facility] Follow up in 1 year.  CBC and CMP updated on 04/11/23.  Orders for CBC and CMP released today.   - Plan: CBC with Differential/Platelet, Comprehensive metabolic panel with GFR  Lupus anticoagulant positive - Lupus anticoagulant  was detected and anticardiolipin IgG and IgA were positive, beta-2  GP 1 IgG positive: Patient is taking aspirin  162 mg daily.  Antibodies have remained positive.  She will remain on Plaquenil  as prescribed and will take aspirin  81 mg 2  tablets daily.  Plan to recheck the following lab work today. - Plan: Lupus Anticoagulant Eval w/Reflex, Cardiolipin antibodies, IgG, IgM, IgA, Beta-2  glycoprotein antibodies  Osteopenia of multiple sites - DEXA updated on 08/10/2021: Right femoral neck BMD 0.601 with T-score -2.2.  She is taking vitamin D  4000 units daily and calcium carbonate twice daily. New DEXA order will be placed today  Primary osteoarthritis of both hands: PIP and DIP thickening consistent with osteoarthritis.  No tenderness or synovitis noted.  Complete fist formation bilaterally.  Primary osteoarthritis of both knees: She has good range of motion of both knee joints on examination today.  No warmth or effusion noted.  Other medical conditions are listed as follows:  History of hyperlipidemia  History of herpes zoster keratoconjunctivitis  History of sleep apnea  Severe major depression without psychotic features (HCC)  Rosacea    Orders: Orders Placed This Encounter  Procedures   DG BONE DENSITY (DXA)   CBC with Differential/Platelet   Comprehensive metabolic panel with GFR   Sedimentation rate   C3 and C4   Protein / creatinine ratio, urine   Anti-DNA antibody, double-stranded   Lupus Anticoagulant Eval w/Reflex   Cardiolipin antibodies, IgG, IgM, IgA   Beta-2  glycoprotein antibodies   Meds ordered this encounter  Medications   hydroxychloroquine  (PLAQUENIL ) 200 MG tablet    Sig: Take 1 tablet (200 mg total) by mouth daily.    Dispense:  90 tablet    Refill:  0    Follow-Up Instructions: Return in about 6 months (around 04/16/2024) for Autoimmune Disease.   Waddell CHRISTELLA Craze, PA-C  Note - This record has been created using Dragon software.  Chart creation  errors have been sought, but may not always  have been located. Such creation errors do not reflect on  the standard of medical care.

## 2023-10-10 DIAGNOSIS — F5101 Primary insomnia: Secondary | ICD-10-CM | POA: Diagnosis not present

## 2023-10-10 DIAGNOSIS — G4733 Obstructive sleep apnea (adult) (pediatric): Secondary | ICD-10-CM | POA: Diagnosis not present

## 2023-10-10 DIAGNOSIS — G4731 Primary central sleep apnea: Secondary | ICD-10-CM | POA: Diagnosis not present

## 2023-10-10 DIAGNOSIS — F339 Major depressive disorder, recurrent, unspecified: Secondary | ICD-10-CM | POA: Diagnosis not present

## 2023-10-10 DIAGNOSIS — F411 Generalized anxiety disorder: Secondary | ICD-10-CM | POA: Diagnosis not present

## 2023-10-17 ENCOUNTER — Encounter: Payer: Self-pay | Admitting: Physician Assistant

## 2023-10-17 ENCOUNTER — Ambulatory Visit: Attending: Physician Assistant | Admitting: Physician Assistant

## 2023-10-17 VITALS — BP 114/80 | HR 89 | Temp 98.0°F | Resp 16 | Ht 64.5 in | Wt 186.2 lb

## 2023-10-17 DIAGNOSIS — Z8619 Personal history of other infectious and parasitic diseases: Secondary | ICD-10-CM

## 2023-10-17 DIAGNOSIS — Z8639 Personal history of other endocrine, nutritional and metabolic disease: Secondary | ICD-10-CM

## 2023-10-17 DIAGNOSIS — M19042 Primary osteoarthritis, left hand: Secondary | ICD-10-CM

## 2023-10-17 DIAGNOSIS — Z79899 Other long term (current) drug therapy: Secondary | ICD-10-CM

## 2023-10-17 DIAGNOSIS — R760 Raised antibody titer: Secondary | ICD-10-CM

## 2023-10-17 DIAGNOSIS — F322 Major depressive disorder, single episode, severe without psychotic features: Secondary | ICD-10-CM

## 2023-10-17 DIAGNOSIS — M19041 Primary osteoarthritis, right hand: Secondary | ICD-10-CM

## 2023-10-17 DIAGNOSIS — M359 Systemic involvement of connective tissue, unspecified: Secondary | ICD-10-CM

## 2023-10-17 DIAGNOSIS — M170 Bilateral primary osteoarthritis of knee: Secondary | ICD-10-CM

## 2023-10-17 DIAGNOSIS — M8589 Other specified disorders of bone density and structure, multiple sites: Secondary | ICD-10-CM

## 2023-10-17 DIAGNOSIS — Z8669 Personal history of other diseases of the nervous system and sense organs: Secondary | ICD-10-CM

## 2023-10-17 DIAGNOSIS — L719 Rosacea, unspecified: Secondary | ICD-10-CM

## 2023-10-17 MED ORDER — HYDROXYCHLOROQUINE SULFATE 200 MG PO TABS: 200.0000 mg | ORAL_TABLET | Freq: Every day | ORAL | 0 refills | Status: DC

## 2023-10-18 ENCOUNTER — Ambulatory Visit: Payer: Self-pay | Admitting: Physician Assistant

## 2023-10-18 ENCOUNTER — Other Ambulatory Visit: Payer: Self-pay | Admitting: Physician Assistant

## 2023-10-18 DIAGNOSIS — M359 Systemic involvement of connective tissue, unspecified: Secondary | ICD-10-CM

## 2023-10-18 NOTE — Progress Notes (Signed)
 ALT is borderline elevated but has improved. AST WNL. Rest of CMP WNL.   CBC WNL No proteinura  ESR WNL Complements WNL

## 2023-10-20 LAB — PROTEIN / CREATININE RATIO, URINE
Creatinine, Urine: 13 mg/dL — ABNORMAL LOW (ref 20–275)
Total Protein, Urine: <4 mg/dL — ABNORMAL LOW (ref 5–24)

## 2023-10-20 LAB — LUPUS ANTICOAGULANT EVAL W/ REFLEX
PTT-LA Screen: 49 s — ABNORMAL HIGH (ref <=40)
dRVVT: 63 s — ABNORMAL HIGH (ref <=45)

## 2023-10-20 LAB — CBC WITH DIFFERENTIAL/PLATELET
Absolute Lymphocytes: 1397 {cells}/uL (ref 850–3900)
Absolute Monocytes: 689 {cells}/uL (ref 200–950)
Basophils Absolute: 61 {cells}/uL (ref 0–200)
Basophils Relative: 1 %
Eosinophils Absolute: 360 {cells}/uL (ref 15–500)
Eosinophils Relative: 5.9 %
HCT: 41.5 % (ref 35.0–45.0)
Hemoglobin: 13.9 g/dL (ref 11.7–15.5)
MCH: 29.8 pg (ref 27.0–33.0)
MCHC: 33.5 g/dL (ref 32.0–36.0)
MCV: 89.1 fL (ref 80.0–100.0)
MPV: 10 fL (ref 7.5–12.5)
Monocytes Relative: 11.3 %
Neutro Abs: 3593 {cells}/uL (ref 1500–7800)
Neutrophils Relative %: 58.9 %
Platelets: 267 Thousand/uL (ref 140–400)
RBC: 4.66 Million/uL (ref 3.80–5.10)
RDW: 12.3 % (ref 11.0–15.0)
Total Lymphocyte: 22.9 %
WBC: 6.1 Thousand/uL (ref 3.8–10.8)

## 2023-10-20 LAB — COMPREHENSIVE METABOLIC PANEL WITH GFR
AG Ratio: 2.4 (calc) (ref 1.0–2.5)
ALT: 30 U/L — ABNORMAL HIGH (ref 6–29)
AST: 21 U/L (ref 10–35)
Albumin: 4.7 g/dL (ref 3.6–5.1)
Alkaline phosphatase (APISO): 52 U/L (ref 37–153)
BUN: 19 mg/dL (ref 7–25)
CO2: 26 mmol/L (ref 20–32)
Calcium: 10.2 mg/dL (ref 8.6–10.4)
Chloride: 106 mmol/L (ref 98–110)
Creat: 0.8 mg/dL (ref 0.50–1.05)
Globulin: 2 g/dL (ref 1.9–3.7)
Glucose, Bld: 86 mg/dL (ref 65–99)
Potassium: 4 mmol/L (ref 3.5–5.3)
Sodium: 142 mmol/L (ref 135–146)
Total Bilirubin: 0.4 mg/dL (ref 0.2–1.2)
Total Protein: 6.7 g/dL (ref 6.1–8.1)
eGFR: 81 mL/min/1.73m2 (ref >=60)

## 2023-10-20 LAB — BETA-2 GLYCOPROTEIN ANTIBODIES
Beta-2 Glyco 1 IgA: >65 U/mL — ABNORMAL HIGH (ref <20.0)
Beta-2 Glyco 1 IgM: 7.5 U/mL (ref <20.0)
Beta-2 Glyco I IgG: 82.9 U/mL — ABNORMAL HIGH (ref <20.0)

## 2023-10-20 LAB — ANTI-DNA ANTIBODY, DOUBLE-STRANDED: ds DNA Ab: 1 [IU]/mL

## 2023-10-20 LAB — RFX DRVVT 1:1 MIX: PT, Mixing Interp: POSITIVE — AB

## 2023-10-20 LAB — RFLX HEXAGONAL PHASE CONFIRM: Hexagonal Phase Conf: NEGATIVE

## 2023-10-20 LAB — C3 AND C4
C3 Complement: 145 mg/dL (ref 83–193)
C4 Complement: 22 mg/dL (ref 15–57)

## 2023-10-20 LAB — CARDIOLIPIN ANTIBODIES, IGG, IGM, IGA
Anticardiolipin IgA: >65 [APL'U]/mL — ABNORMAL HIGH (ref <20.0)
Anticardiolipin IgG: 45.2 [GPL'U]/mL — ABNORMAL HIGH (ref <20.0)
Anticardiolipin IgM: 6.4 [MPL'U]/mL (ref <20.0)

## 2023-10-20 LAB — RFLX DRVVT CONFRIM: DRVVT CONFIRM: POSITIVE — AB

## 2023-10-20 LAB — SEDIMENTATION RATE: Sed Rate: 2 mm/h (ref 0–30)

## 2023-10-21 ENCOUNTER — Other Ambulatory Visit: Payer: Self-pay | Admitting: Physician Assistant

## 2023-10-21 DIAGNOSIS — M359 Systemic involvement of connective tissue, unspecified: Secondary | ICD-10-CM

## 2023-10-21 NOTE — Progress Notes (Signed)
 Lupus anticoagulant detected.   Anticardiolipin IgA and IgG remain positive-stable.  Beta-2  glycoprotein  IgG and IgA remain poisitve.  Continue current dose of aspirin   dsDNA is negative-good news!

## 2023-10-22 ENCOUNTER — Telehealth: Payer: Self-pay

## 2023-10-22 NOTE — Telephone Encounter (Signed)
 Received DEXA results from [facility].  Date of Scan: 08/19/2023  Lowest T-score: -2.4  BMD: 0.581  Lowest site measured: right femoral neck.  DX: osteopenia   Significant changes in BMD and site measured (5% and above):6% change right total femur, 7% change AP total spine.   Current Regimen: calcium, vitamin D .   Recommendation: will need treatment, discuss at the follow up, place patient on cancellation list for sooner appt.   Reviewed by: Dr. Dolphus   Next Appointment:  04/16/2024   Attempted to contact patient and left message on machine to advise patient to call the office- will schedule sooner appt.

## 2023-10-25 NOTE — Telephone Encounter (Signed)
 I called patient, patient scheduled 11/13/2023

## 2023-11-01 DIAGNOSIS — E7800 Pure hypercholesterolemia, unspecified: Secondary | ICD-10-CM | POA: Diagnosis not present

## 2023-11-01 DIAGNOSIS — E785 Hyperlipidemia, unspecified: Secondary | ICD-10-CM | POA: Diagnosis not present

## 2023-11-01 DIAGNOSIS — F3341 Major depressive disorder, recurrent, in partial remission: Secondary | ICD-10-CM | POA: Diagnosis not present

## 2023-11-12 DIAGNOSIS — K080 Exfoliation of teeth due to systemic causes: Secondary | ICD-10-CM | POA: Diagnosis not present

## 2023-11-12 NOTE — Progress Notes (Signed)
 Office Visit Note  Patient: Brianna Ross             Date of Birth: April 20, 1955           MRN: [id number]             PCP: Elliot Charm, MD Referring: Elliot Charm,* Visit Date: 11/26/2023 Occupation: Data Unavailable  Subjective:  Discussed DEXA results  History of Present Illness: Gordon Carlson is a 68 y.o. female with autoimmune disease, osteoarthritis and osteopenia.  She returns today to discuss DEXA results.  She denies any history of oral ulcers, nasal ulcers, malar rash, photosensitivity, Raynaud's or lymphadenopathy.  She has been taking hydroxychloroquine  200 mg daily and aspirin  81 mg 2 tablets daily.  She denies any arterial or venous thrombosis events.  She has been taking calcium and vitamin D .  She states she walks for exercise.  She has not been lifting weights.    Activities of Daily Living:  Patient reports morning stiffness for 0 minutes.   Patient Denies nocturnal pain.  Difficulty dressing/grooming: Denies Difficulty climbing stairs: Denies Difficulty getting out of chair: Denies Difficulty using hands for taps, buttons, cutlery, and/or writing: Denies  Review of Systems  Constitutional:  Negative for fatigue.  HENT:  Negative for mouth sores and mouth dryness.   Eyes:  Negative for dryness.  Respiratory:  Negative for shortness of breath.   Cardiovascular:  Negative for chest pain and palpitations.  Gastrointestinal:  Negative for blood in stool, constipation and diarrhea.  Endocrine: Negative for increased urination.  Genitourinary:  Negative for involuntary urination.  Musculoskeletal:  Negative for joint pain, gait problem, joint pain, joint swelling, myalgias, muscle weakness, morning stiffness, muscle tenderness and myalgias.  Skin:  Negative for color change, rash, hair loss and sensitivity to sunlight.  Allergic/Immunologic: Negative for susceptible to infections.  Neurological:  Negative for dizziness and headaches.  Hematological:   Negative for swollen glands.  Psychiatric/Behavioral:  Negative for depressed mood and sleep disturbance. The patient is not nervous/anxious.     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   Positive ANA (antinuclear antibody) 03/06/2012   Elevated aldolase level 03/06/2012   Atypical nevi 11/14/2011   Menopause 11/13/2011   Rosacea 11/13/2011    Past Medical History:  Diagnosis Date   Abnormal stress electrocardiogram test    Allergy    ANA positive    Atypical nevi    Depression    Headache(784.0)    Hives 2011   x5   Hyperlipidemia    Lichen sclerosus 2024   dx by GYN per patient   Menopause    OSA (obstructive sleep apnea)    Osteoarthritis    Right wrist fracture    Rosacea    Shingles 03/2012   Shortness of breath    Sleep apnea  Urticaria     Family History  Problem Relation Age of Onset   Glaucoma Mother    Hyperlipidemia Mother    Depression Mother    Osteoporosis Mother    Breast cancer Mother        74y   Dementia Mother    Cancer Mother    Heart disease Father    Hyperlipidemia Father    Hypertension Father    Heart defect Sister    Heart disease Sister 49       congenital heart defect,deceased   Hyperlipidemia  Brother    Depression Brother    High Cholesterol Brother    Sleep apnea Brother    Cancer Paternal Grandmother    Stroke Paternal Grandfather    Heart disease Paternal Grandfather    Past Surgical History:  Procedure Laterality Date   ORIF WRIST FRACTURE     thymic cyst     removal   WISDOM TOOTH EXTRACTION     Social History   Tobacco Use   Smoking status: Never    Passive exposure: Never   Smokeless tobacco: Never  Vaping Use   Vaping status: Never Used  Substance Use Topics   Alcohol use: No   Drug use: Never   Social History   Social History Narrative   Patient works for [employer].(full-time_    Patient lives at home with her husband Redell).   Patient does not have any children.   Patient is right-handed.   Patient drinks one cup of coffee everyday and two cups of tea daily.     Immunization History  Administered Date(s) Administered   Influenza Split 11/13/2011   Influenza,inj,Quad PF,6+ Mos 11/20/2016   Influenza-Unspecified 09/11/2013   Moderna Sars-Covid-2 Vaccination 02/25/2019, 03/25/2019, 11/24/2019   Pneumococcal Polysaccharide-23 11/12/2012     Objective: Vital Signs: BP 110/77   Pulse 94   Temp (!) 97.5 F (36.4 C)   Resp 16   Ht 5' 4.5 (1.638 m)   Wt 182 lb 3.2 oz (82.6 kg)   BMI 30.79 kg/m    Physical Exam Vitals and nursing note reviewed.  Constitutional:      Appearance: She is well-developed.  HENT:     Head: Normocephalic and atraumatic.  Eyes:     Conjunctiva/sclera: Conjunctivae normal.  Cardiovascular:     Rate and Rhythm: Normal rate and regular rhythm.     Heart sounds: Normal heart sounds.  Pulmonary:     Effort: Pulmonary effort is normal.     Breath sounds: Normal breath sounds.  Abdominal:     General: Bowel sounds are normal.     Palpations: Abdomen is soft.  Musculoskeletal:     Cervical back: Normal range of motion.  Lymphadenopathy:     Cervical: No cervical adenopathy.  Skin:     General: Skin is warm and dry.     Capillary Refill: Capillary refill takes less than 2 seconds.  Neurological:     Mental Status: She is alert and oriented to person, place, and time.  Psychiatric:        Behavior: Behavior normal.      Musculoskeletal Exam:Cervical, thoracic and lumbar spine were in good range of motion.  There was no SI joint tenderness.  Shoulder joints, elbow joints, wrist joints, MCPs, PIPs and DIPs were in good range of motion with no synovitis.  Hip joints and knee joints were in good range of motion without any warmth swelling or effusion.  There was no tenderness  over ankles or MTPs.   CDAI Exam: CDAI Score: -- Patient Global: --; Provider Global: -- Swollen: --; Tender: -- Joint Exam 11/26/2023   No joint exam has been documented for this visit   There is currently no information documented on the homunculus. Go to the Rheumatology activity and complete the homunculus joint exam.  Investigation: No additional findings.  Imaging: No results found.  Recent Labs: Lab Results  Component Value Date   WBC 6.1 10/17/2023   HGB 13.9 10/17/2023   PLT 267 10/17/2023   NA 142 10/17/2023   K 4.0 10/17/2023   CL 106 10/17/2023   CO2 26 10/17/2023   GLUCOSE 86 10/17/2023   BUN 19 10/17/2023   CREATININE 0.80 10/17/2023   BILITOT 0.4 10/17/2023   ALKPHOS 48 07/18/2020   AST 21 10/17/2023   ALT 30 (H) 10/17/2023   PROT 6.7 10/17/2023   ALBUMIN 4.2 07/18/2020   CALCIUM 10.2 10/17/2023   GFRAA 82 06/14/2020   October 17, 2023 urine protein creatinine ratio normal, sed rate 2, double-stranded DNA negative, C3-C4 normal, beta-2  GP 1 IgG and IgA remains positive, anticardiolipin IgG and IgA remain positive, lupus anticoagulant remain positive  August 19, 2023 DEXA scan right femoral neck BMD 0.581, T-score -2.4 no comparison, AP spine 7% left total femur 3%  Speciality Comments: PLQ Eye Exam: 07/17/2023 WNL @ [facility] Follow up in 1  year  Procedures:  No procedures performed Allergies: Patient has no known allergies.   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  Lab results were reviewed with the patient.  She continues to have high titer antibodies.  She is on Plaquenil  and aspirin  combination.  There is no history of thrombotic events.  Increased risk of bleeding in association with aspirin  was  discussed.  High risk medication use - Plaquenil  200 mg 1 tablet by mouth daily. PLQ Eye Exam: 07/17/2023.  CBC and CMP were stable in October except for mildly elevated LFTs.  Will continue to monitor.  Lupus anticoagulant positive - Lupus anticoagulant was detected and anticardiolipin IgG and IgA were positive, beta-2  GP 1 IgG positive: Patient is taking aspirin  162 mg daily.  Osteopenia of multiple sites - August 19, 2023 DEXA scan right femoral neck BMD 0.581, T-score -2.4 no comparison, AP spine 7% left total femur 3% DEXA 08/10/2021: Right femoral neck BMD 0.601 with T-score -2.2.  DEXA results were discussed with the patient.  I discussed possible use of alendronate.  Patient has been taking calcium and vitamin D  and has been exercising.  After discussing the side effects patient decided that she would like to hold off alendronate.  Repeat DEXA scan in 2 years.  A handout on osteopenia and alendronate was placed in the AVS.  Primary osteoarthritis of both hands-bilateral PIP and DIP thickening was noted.  Joint protection was discussed.  Primary osteoarthritis of both knees-she denies discomfort today.  History of  hyperlipidemia  History of herpes zoster keratoconjunctivitis  History of sleep apnea  Severe major depression without psychotic features (HCC)  Rosacea  Orders: Orders Placed This Encounter  Procedures   Protein / creatinine ratio, urine   CBC with Differential/Platelet  Comprehensive metabolic panel with GFR   Anti-DNA antibody, double-stranded   C3 and C4   Sedimentation rate   ANA   Beta-2  glycoprotein antibodies   Cardiolipin antibodies, IgG, IgM, IgA   Lupus Anticoagulant Eval w/Reflex   No orders of the defined types were placed in this encounter.    Follow-Up Instructions: Return in about 5 months (around 04/25/2024) for Autoimmune disease.   Maya Nash, MD  Note - This record has been created using Animal nutritionist.  Chart creation errors have been sought, but may not always  have been located. Such creation errors do not reflect on  the standard of medical care.

## 2023-11-13 ENCOUNTER — Ambulatory Visit: Admitting: Physician Assistant

## 2023-11-21 DIAGNOSIS — F061 Catatonic disorder due to known physiological condition: Secondary | ICD-10-CM | POA: Diagnosis not present

## 2023-11-21 DIAGNOSIS — F332 Major depressive disorder, recurrent severe without psychotic features: Secondary | ICD-10-CM | POA: Diagnosis not present

## 2023-11-21 DIAGNOSIS — F411 Generalized anxiety disorder: Secondary | ICD-10-CM | POA: Diagnosis not present

## 2023-11-21 DIAGNOSIS — F5101 Primary insomnia: Secondary | ICD-10-CM | POA: Diagnosis not present

## 2023-11-26 ENCOUNTER — Encounter: Payer: Self-pay | Admitting: Rheumatology

## 2023-11-26 ENCOUNTER — Ambulatory Visit: Attending: Rheumatology | Admitting: Rheumatology

## 2023-11-26 VITALS — BP 110/77 | HR 94 | Temp 97.5°F | Resp 16 | Ht 64.5 in | Wt 182.2 lb

## 2023-11-26 DIAGNOSIS — M359 Systemic involvement of connective tissue, unspecified: Secondary | ICD-10-CM | POA: Diagnosis not present

## 2023-11-26 DIAGNOSIS — Z8639 Personal history of other endocrine, nutritional and metabolic disease: Secondary | ICD-10-CM

## 2023-11-26 DIAGNOSIS — Z79899 Other long term (current) drug therapy: Secondary | ICD-10-CM

## 2023-11-26 DIAGNOSIS — M19041 Primary osteoarthritis, right hand: Secondary | ICD-10-CM

## 2023-11-26 DIAGNOSIS — Z8619 Personal history of other infectious and parasitic diseases: Secondary | ICD-10-CM

## 2023-11-26 DIAGNOSIS — R760 Raised antibody titer: Secondary | ICD-10-CM

## 2023-11-26 DIAGNOSIS — M8589 Other specified disorders of bone density and structure, multiple sites: Secondary | ICD-10-CM

## 2023-11-26 DIAGNOSIS — Z8669 Personal history of other diseases of the nervous system and sense organs: Secondary | ICD-10-CM

## 2023-11-26 DIAGNOSIS — M19042 Primary osteoarthritis, left hand: Secondary | ICD-10-CM

## 2023-11-26 DIAGNOSIS — L719 Rosacea, unspecified: Secondary | ICD-10-CM

## 2023-11-26 DIAGNOSIS — F322 Major depressive disorder, single episode, severe without psychotic features: Secondary | ICD-10-CM

## 2023-11-26 DIAGNOSIS — M170 Bilateral primary osteoarthritis of knee: Secondary | ICD-10-CM

## 2023-11-26 NOTE — Patient Instructions (Addendum)
 Osteopenia  Osteopenia is a loss of thickness (density) inside the bones. Another name for osteopenia is low bone mass. Mild osteopenia is a normal part of aging. It is not a disease, and it does not cause symptoms. However, if you have osteopenia and continue to lose bone mass, you could develop a condition that causes the bones to become thin and break more easily (osteoporosis). Osteoporosis can cause you to lose some height, have back pain, and have a stooped posture. Although osteopenia is not a disease, making changes to your lifestyle and diet can help to prevent osteopenia from developing into osteoporosis. What are the causes? Osteopenia is caused by loss of calcium in the bones. Bones are constantly changing. Old bone cells are continually being replaced with new bone cells. This process builds new bone. The mineral calcium is needed to build new bone and maintain bone density. Bone density is usually highest around age 56. After that, most people's bodies cannot replace all the bone they have lost with new bone. What increases the risk? You are more likely to develop this condition if: You are older than age 52. You are a woman who went through menopause early. You have a long illness that keeps you in bed. You do not get enough exercise. You lack certain nutrients (malnutrition). You have an overactive thyroid  gland (hyperthyroidism). You use products that contain nicotine or tobacco, such as cigarettes, e-cigarettes and chewing tobacco, or you drink a lot of alcohol. You are taking medicines that weaken the bones, such as steroids. What are the signs or symptoms? This condition does not cause any symptoms. You may have a slightly higher risk for bone breaks (fractures), so getting fractures more easily than normal may be an indication of osteopenia. How is this diagnosed? This condition may be diagnosed based on an X-ray exam that measures bone density (dual-energy X-ray  absorptiometry, or DEXA). This test can measure bone density in your hips, spine, and wrists. Osteopenia has no symptoms, so this condition is usually diagnosed after a routine bone density screening test is done for osteoporosis. This routine screening is usually done for: Women who are age 3 or older. Men who are age 74 or older. If you have risk factors for osteopenia, you may have the screening test at an earlier age. How is this treated? Making dietary and lifestyle changes can lower your risk for osteoporosis. If you have severe osteopenia that is close to becoming osteoporosis, this condition can be treated with medicines and dietary supplements such as calcium and vitamin D . These supplements help to rebuild bone density. Follow these instructions at home: Eating and drinking Eat a diet that is high in calcium and vitamin D . Calcium is found in dairy products, beans, salmon, and leafy green vegetables like spinach and broccoli. Look for foods that have vitamin D  and calcium added to them (fortified foods), such as orange juice, cereal, and bread.  Lifestyle Do 30 minutes or more of a weight-bearing exercise every day, such as walking, jogging, or playing a sport. These types of exercises strengthen the bones. Do not use any products that contain nicotine or tobacco, such as cigarettes, e-cigarettes, and chewing tobacco. If you need help quitting, ask your health care provider. Do not drink alcohol if: Your health care provider tells you not to drink. You are pregnant, may be pregnant, or are planning to become pregnant. If you drink alcohol: Limit how much you use to: 0-1 drink a day for women. 0-2  drinks a day for men. Be aware of how much alcohol is in your drink. In the [place] one drink equals one 12 oz bottle of beer (355 mL), one 5 oz glass of wine (148 mL), or one 1 oz glass of hard liquor (44 mL). General instructions Take over-the-counter and prescription medicines only as  told by your health care provider. These include vitamins and supplements. Take precautions at home to lower your risk of falling, such as: Keeping rooms well-lit and free of clutter, such as cords. Installing safety rails on stairs. Using rubber mats in the bathroom or other areas that are often wet or slippery. Keep all follow-up visits. This is important. Contact a health care provider if: You have not had a bone density screening for osteoporosis and you are: A woman who is age 78 or older. A man who is age 51 or older. You are a postmenopausal woman who has not had a bone density screening for osteoporosis. You are older than age 14 and you want to know if you should have bone density screening for osteoporosis. Summary Osteopenia is a loss of thickness (density) inside the bones. Another name for osteopenia is low bone mass. Osteopenia is not a disease, but it may increase your risk for a condition that causes the bones to become thin and break more easily (osteoporosis). You may be at risk for osteopenia if you are older than age 47 or if you are a woman who went through early menopause. Osteopenia does not cause any symptoms, but it can be diagnosed with a bone density screening test. Dietary and lifestyle changes are the first treatment for osteopenia. These may lower your risk for osteoporosis. This information is not intended to replace advice given to you by your health care provider. Make sure you discuss any questions you have with your health care provider. Document Revised: 09/04/2022 Document Reviewed: 09/04/2022 Elsevier Patient Education  2025 [employer].  Alendronate Tablets What is this medication? ALENDRONATE (a LEN droe nate) prevents and treats osteoporosis. It may also be used to treat Paget disease of the bone. It works by interior and spatial designer stronger and less likely to break (fracture). It belongs to a group of medications called bisphosphonates. This medicine may  be used for other purposes; ask your health care provider or pharmacist if you have questions. COMMON BRAND NAME(S): Fosamax What should I tell my care team before I take this medication? They need to know if you have any of these conditions: Bleeding disorder Cancer Dental disease Difficulty swallowing Infection (fever, chills, cough, sore throat, pain or trouble passing urine) Kidney disease Low levels of calcium or other minerals in the blood Low red blood cell counts Receiving steroids like dexamethasone or prednisone Stomach or intestine problems Trouble sitting or standing for 30 minutes An unusual or allergic reaction to alendronate, other medications, foods, dyes or preservatives Pregnant or trying to get pregnant Breast-feeding How should I use this medication? Take this medication by mouth with a full glass of water. Take it as directed on the prescription label at the same time every day. Take the dose right after waking up. Do not eat or drink anything before taking it. Do not take it with any other drink except water. Do not chew or crush the tablet. After taking it, do not eat breakfast, drink, or take any other medications or vitamins for at least 30 minutes. Sit or stand up for at least 30 minutes after you take it. Do  not lie down. Keep taking it unless your care team tells you to stop. A special MedGuide will be given to you by the pharmacist with each prescription and refill. Be sure to read this information carefully each time. Talk to your care team about the use of this medication in children. Special care may be needed. Overdosage: If you think you have taken too much of this medicine contact a poison control center or emergency room at once. NOTE: This medicine is only for you. Do not share this medicine with others. What if I miss a dose? If you take your medication once a day, skip it. Take your next dose at the scheduled time the next morning. Do not take two doses  on the same day. If you take your medication once a week, take the missed dose on the morning after you remember. Do not take two doses on the same day. What may interact with this medication? Aluminum hydroxide Antacids Aspirin  Calcium supplements Medications for inflammation like ibuprofen, naproxen, and others Iron supplements Magnesium supplements Vitamins with minerals This list may not describe all possible interactions. Give your health care provider a list of all the medicines, herbs, non-prescription drugs, or dietary supplements you use. Also tell them if you smoke, drink alcohol, or use illegal drugs. Some items may interact with your medicine. What should I watch for while using this medication? Visit your care team for regular checks on your progress. It may be some time before you see the benefit from this medication. Some people who take this medication have severe bone, joint, or muscle pain. This medication may also increase your risk for jaw problems or a broken thigh bone. Tell your care team right away if you have severe pain in your jaw, bones, joints, or muscles. Tell you care team if you have any pain that does not go away or that gets worse. Tell your dentist and dental surgeon that you are taking this medication. You should not have major dental surgery while on this medication. See your dentist to have a dental exam and fix any dental problems before starting this medication. Take good care of your teeth while on this medication. Make sure you see your dentist for regular follow-up appointments. You should make sure you get enough calcium and vitamin D  while you are taking this medication. Discuss the foods you eat and the vitamins you take with your care team. You may need blood work done while you are taking this medication. What side effects may I notice from receiving this medication? Side effects that you should report to your care team as soon as possible: Allergic  reactions--skin rash, itching, hives, swelling of the face, lips, tongue, or throat Low calcium level--muscle pain or cramps, confusion, tingling, or numbness in the hands or feet Osteonecrosis of the jaw--pain, swelling, or redness in the mouth, numbness of the jaw, poor healing after dental work, unusual discharge from the mouth, visible bones in the mouth Pain or trouble swallowing Severe bone, joint, or muscle pain Stomach bleeding--bloody or black, tar-like stools, vomiting blood or brown material that looks like coffee grounds Side effects that usually do not require medical attention (report to your care team if they continue or are bothersome): Constipation Diarrhea Nausea Stomach pain This list may not describe all possible side effects. Call your doctor for medical advice about side effects. You may report side effects to FDA at 1-800-FDA-1088. Where should I keep my medication? Keep out of the reach  of children and pets. Store at room temperature between 15 and 30 degrees C (59 and 86 degrees F). Throw away any unused medication after the expiration date. NOTE: This sheet is a summary. It may not cover all possible information. If you have questions about this medicine, talk to your doctor, pharmacist, or health care provider.  2024 Elsevier/Gold Standard (2019-12-31 00:00:00)

## 2023-12-01 DIAGNOSIS — F3341 Major depressive disorder, recurrent, in partial remission: Secondary | ICD-10-CM | POA: Diagnosis not present

## 2023-12-01 DIAGNOSIS — E7800 Pure hypercholesterolemia, unspecified: Secondary | ICD-10-CM | POA: Diagnosis not present

## 2023-12-01 DIAGNOSIS — E785 Hyperlipidemia, unspecified: Secondary | ICD-10-CM | POA: Diagnosis not present

## 2023-12-04 ENCOUNTER — Ambulatory Visit: Payer: Medicare Other | Admitting: Adult Health

## 2023-12-19 DIAGNOSIS — H25042 Posterior subcapsular polar age-related cataract, left eye: Secondary | ICD-10-CM | POA: Diagnosis not present

## 2023-12-19 DIAGNOSIS — H2513 Age-related nuclear cataract, bilateral: Secondary | ICD-10-CM | POA: Diagnosis not present

## 2023-12-19 DIAGNOSIS — H40013 Open angle with borderline findings, low risk, bilateral: Secondary | ICD-10-CM | POA: Diagnosis not present

## 2024-01-08 ENCOUNTER — Encounter: Payer: Self-pay | Admitting: Adult Health

## 2024-01-08 DIAGNOSIS — G4733 Obstructive sleep apnea (adult) (pediatric): Secondary | ICD-10-CM

## 2024-01-09 NOTE — Telephone Encounter (Signed)
 SABRA

## 2024-01-10 ENCOUNTER — Encounter: Payer: Self-pay | Admitting: Adult Health

## 2024-01-14 NOTE — Telephone Encounter (Signed)
 Please review DL

## 2024-01-15 ENCOUNTER — Encounter: Payer: Self-pay | Admitting: Adult Health

## 2024-01-15 NOTE — Telephone Encounter (Signed)
 Sent order to DME this am

## 2024-01-15 NOTE — Telephone Encounter (Signed)
 SABRA

## 2024-01-21 ENCOUNTER — Other Ambulatory Visit: Payer: Self-pay | Admitting: *Deleted

## 2024-01-21 DIAGNOSIS — M359 Systemic involvement of connective tissue, unspecified: Secondary | ICD-10-CM

## 2024-01-21 MED ORDER — HYDROXYCHLOROQUINE SULFATE 200 MG PO TABS: 200.0000 mg | ORAL_TABLET | Freq: Every day | ORAL | 0 refills | Status: AC

## 2024-01-21 NOTE — Telephone Encounter (Signed)
 Patient contacted the office and left message requesting a refill to be send to the pharmacy [facility].   Last Fill: 10/17/2023  Eye exam:  07/17/2023 WNL    Labs: 10/17/2023 ALT is borderline elevated but has improved. AST WNL. Rest of CMP WNL.  CBC WNL  Next Visit: 04/16/2024  Last Visit: 11/26/2023  DX: Autoimmune disease   Current Dose per office note 11/26/2023: Plaquenil  200 mg 1 tablet by mouth daily.   Okay to refill Plaquenil ?

## 2024-04-16 ENCOUNTER — Ambulatory Visit: Admitting: Rheumatology

## 2024-07-13 ENCOUNTER — Ambulatory Visit: Admitting: Adult Health
